# Patient Record
Sex: Female | Born: 1950
Health system: Southern US, Community
[De-identification: ages and names within clinical notes are randomized; demographics above are authoritative.]

## PROBLEM LIST (undated history)

## (undated) DIAGNOSIS — E785 Hyperlipidemia, unspecified: Secondary | ICD-10-CM

## (undated) DIAGNOSIS — M810 Age-related osteoporosis without current pathological fracture: Secondary | ICD-10-CM

## (undated) DIAGNOSIS — R55 Syncope and collapse: Secondary | ICD-10-CM

## (undated) DIAGNOSIS — C801 Malignant (primary) neoplasm, unspecified: Secondary | ICD-10-CM

## (undated) DIAGNOSIS — Z720 Tobacco use: Secondary | ICD-10-CM

## (undated) HISTORY — PX: TUBAL LIGATION: SHX77

## (undated) HISTORY — DX: Tobacco use: Z72.0

## (undated) HISTORY — DX: Hyperlipidemia, unspecified: E78.5

## (undated) HISTORY — PX: OTHER SURGICAL HISTORY: SHX169

## (undated) HISTORY — PX: COLON SURGERY: SHX602

## (undated) HISTORY — DX: Age-related osteoporosis without current pathological fracture: M81.0

## (undated) HISTORY — DX: Syncope and collapse: R55

---

## 1999-11-01 ENCOUNTER — Ambulatory Visit (HOSPITAL_COMMUNITY): Admission: RE | Admit: 1999-11-01 | Discharge: 1999-11-01 | Payer: Self-pay | Admitting: Family Medicine

## 1999-11-01 ENCOUNTER — Encounter: Payer: Self-pay | Admitting: Family Medicine

## 2011-12-24 ENCOUNTER — Encounter: Payer: Self-pay | Admitting: *Deleted

## 2013-09-20 ENCOUNTER — Ambulatory Visit: Payer: Self-pay | Attending: Internal Medicine

## 2013-09-28 ENCOUNTER — Encounter: Payer: Self-pay | Admitting: Internal Medicine

## 2013-09-28 ENCOUNTER — Ambulatory Visit: Payer: Self-pay | Attending: Internal Medicine | Admitting: Internal Medicine

## 2013-09-28 VITALS — BP 125/75 | HR 83 | Temp 98.4°F | Resp 16 | Ht 64.0 in | Wt 138.0 lb

## 2013-09-28 DIAGNOSIS — Z720 Tobacco use: Secondary | ICD-10-CM | POA: Insufficient documentation

## 2013-09-28 DIAGNOSIS — O020 Blighted ovum and nonhydatidiform mole: Secondary | ICD-10-CM | POA: Insufficient documentation

## 2013-09-28 DIAGNOSIS — F17200 Nicotine dependence, unspecified, uncomplicated: Secondary | ICD-10-CM

## 2013-09-28 DIAGNOSIS — D234 Other benign neoplasm of skin of scalp and neck: Secondary | ICD-10-CM | POA: Insufficient documentation

## 2013-09-28 DIAGNOSIS — Z139 Encounter for screening, unspecified: Secondary | ICD-10-CM

## 2013-09-28 DIAGNOSIS — K649 Unspecified hemorrhoids: Secondary | ICD-10-CM

## 2013-09-28 DIAGNOSIS — Z808 Family history of malignant neoplasm of other organs or systems: Secondary | ICD-10-CM | POA: Insufficient documentation

## 2013-09-28 DIAGNOSIS — Z1211 Encounter for screening for malignant neoplasm of colon: Secondary | ICD-10-CM

## 2013-09-28 DIAGNOSIS — O0289 Other abnormal products of conception: Secondary | ICD-10-CM

## 2013-09-28 HISTORY — DX: Unspecified hemorrhoids: K64.9

## 2013-09-28 LAB — CBC WITH DIFFERENTIAL/PLATELET
Basophils Absolute: 0 10*3/uL (ref 0.0–0.1)
Basophils Relative: 1 % (ref 0–1)
Eosinophils Absolute: 0.2 10*3/uL (ref 0.0–0.7)
Eosinophils Relative: 4 % (ref 0–5)
HCT: 38.6 % (ref 36.0–46.0)
Hemoglobin: 13 g/dL (ref 12.0–15.0)
Lymphocytes Relative: 42 % (ref 12–46)
Lymphs Abs: 1.8 10*3/uL (ref 0.7–4.0)
MCH: 30.9 pg (ref 26.0–34.0)
MCHC: 33.7 g/dL (ref 30.0–36.0)
MCV: 91.7 fL (ref 78.0–100.0)
Monocytes Absolute: 0.3 10*3/uL (ref 0.1–1.0)
Monocytes Relative: 7 % (ref 3–12)
Neutro Abs: 2 10*3/uL (ref 1.7–7.7)
Neutrophils Relative %: 46 % (ref 43–77)
Platelets: 273 10*3/uL (ref 150–400)
RBC: 4.21 MIL/uL (ref 3.87–5.11)
RDW: 12.7 % (ref 11.5–15.5)
WBC: 4.3 10*3/uL (ref 4.0–10.5)

## 2013-09-28 MED ORDER — HYDROCORTISONE ACETATE 25 MG RE SUPP: 25.0000 mg | Freq: Two times a day (BID) | RECTAL | Status: DC

## 2013-09-28 MED ORDER — NICOTINE 21 MG/24HR TD PT24: 21.0000 mg | MEDICATED_PATCH | Freq: Every day | TRANSDERMAL | Status: DC

## 2013-09-28 NOTE — Progress Notes (Signed)
Patient Demographics  Sara Coffey, is a 63 y.o. female  OMB:[id number]  GTX:[id number]  DOB - 11/24/1950  CC:  Chief Complaint  Patient presents with  . Establish Care       HPI: Sara Coffey is a 62 y.o. female here today to establish medical care. Patient has history of hemorrhoids and is requesting a medication, several years ago patient had colonoscopy done and was told she had a polyp removed, patient also reported to have noticed a mole on her scalp, patient has family history of skin cancer she is worried about, she does smoke cigarettes, advised to quit smoking she is going to try nicotine patch. Patient has No headache, No chest pain, No abdominal pain - No Nausea, No new weakness tingling or numbness, No Cough - SOB.  No Active Allergies Past Medical History  Diagnosis Date  . Syncope and collapse   . Hyperlipidemia   . Tobacco abuse    Current Outpatient Prescriptions on File Prior to Visit  Medication Sig Dispense Refill  . calcium carbonate (OS-CAL) 1250 MG chewable tablet Chew 1 tablet by mouth as directed.      . lovastatin (MEVACOR) 40 MG tablet Take 40 mg by mouth at bedtime.      . multivitamin (THERAGRAN) per tablet Take 1 tablet by mouth daily.       No current facility-administered medications on file prior to visit.   Family History  Problem Relation Age of Onset  . Heart disease Father   . Hypertension Father   . Diabetes Father   . Hyperlipidemia Brother    History   Social History  . Marital Status: Single    Spouse Name: N/A    Number of Children: N/A  . Years of Education: N/A   Occupational History  . Not on file.   Social History Main Topics  . Smoking status: Current Every Day Smoker -- 0.50 packs/day for 40 years    Types: Cigarettes  . Smokeless tobacco: Never Used  . Alcohol Use: No  . Drug Use: No  . Sexual Activity: Not on file   Other Topics Concern  . Not on file   Social History Narrative  . No narrative  on file    Review of Systems: Constitutional: Negative for fever, chills, diaphoresis, activity change, appetite change and fatigue. HENT: Negative for ear pain, nosebleeds, congestion, facial swelling, rhinorrhea, neck pain, neck stiffness and ear discharge.  Eyes: Negative for pain, discharge, redness, itching and visual disturbance. Respiratory: Negative for cough, choking, chest tightness, shortness of breath, wheezing and stridor.  Cardiovascular: Negative for chest pain, palpitations and leg swelling. Gastrointestinal: Negative for abdominal distention. Genitourinary: Negative for dysuria, urgency, frequency, hematuria, flank pain, decreased urine volume, difficulty urinating and dyspareunia.  Musculoskeletal: Negative for back pain, joint swelling, arthralgia and gait problem. Neurological: Negative for dizziness, tremors, seizures, syncope, facial asymmetry, speech difficulty, weakness, light-headedness, numbness and headaches.  Hematological: Negative for adenopathy. Does not bruise/bleed easily. Psychiatric/Behavioral: Negative for hallucinations, behavioral problems, confusion, dysphoric mood, decreased concentration and agitation.    Objective:   Filed Vitals:   09/28/13 1442  BP: 125/75  Pulse: 83  Temp: 98.4 F (36.9 C)  Resp: 16    Physical Exam: Constitutional: Patient appears well-developed and well-nourished. No distress. HENT: Normocephalic, atraumatic, External right and left ear normal. Oropharynx is clear and moist.  Eyes: Conjunctivae and EOM are normal. PERRLA, no scleral icterus. Neck: Normal ROM. Neck supple. No JVD. No tracheal deviation.  No thyromegaly. CVS: RRR, S1/S2 +, no murmurs, no gallops, no carotid bruit.  Pulmonary: Effort and breath sounds normal, no stridor, rhonchi, wheezes, rales.  Abdominal: Soft. BS +, no distension, tenderness, rebound or guarding.  Musculoskeletal: Normal range of motion. No edema and no tenderness.  Neuro: Alert.  Normal reflexes, muscle tone coordination. No cranial nerve deficit. Skin: Mole  on the scalp noticed Psychiatric: Normal mood and affect. Behavior, judgment, thought content normal.  No results found for this basename: WBC, HGB, HCT, MCV, PLT   No results found for this basename: CREATININE, BUN, NA, K, CL, CO2    No results found for this basename: HGBA1C   Lipid Panel  No results found for this basename: chol, trig, hdl, cholhdl, vldl, ldlcalc       Assessment and plan:   1. Hemorrhoid  - hydrocortisone (ANUSOL-HC) 25 MG suppository; Place 1 suppository (25 mg total) rectally 2 (two) times daily.  Dispense: 12 suppository; Refill: 0  2. Smoking  - nicotine (NICODERM CQ) 21 mg/24hr patch; Place 1 patch (21 mg total) onto the skin daily.  Dispense: 28 patch; Refill: 0  3. Carneous mole  - Ambulatory referral to Dermatology  4. Special screening for malignant neoplasms, colon  - Ambulatory referral to Gastroenterology  5. Screening Baseline blood work.  - CBC with Differential - COMPLETE METABOLIC PANEL WITH GFR - TSH - Lipid panel - Vit D  25 hydroxy (rtn osteoporosis monitoring) - MM DIGITAL SCREENING BILATERAL; Future     Health Maintenance -Colonoscopy:  Referral done  -Mammogram: ordered    Return in about 6 weeks (around 11/09/2013).    Lorayne Marek, MD

## 2013-09-28 NOTE — Progress Notes (Signed)
Pt is here to establish care. Pt has a mole on her head that is bothersome and bleeds sometimes. Pt reports that she has hemorrhoids.

## 2013-09-29 ENCOUNTER — Telehealth: Payer: Self-pay

## 2013-09-29 LAB — COMPLETE METABOLIC PANEL WITH GFR
ALT: 15 U/L (ref 0–35)
AST: 16 U/L (ref 0–37)
Albumin: 4.5 g/dL (ref 3.5–5.2)
Alkaline Phosphatase: 85 U/L (ref 39–117)
BUN: 10 mg/dL (ref 6–23)
CO2: 29 mEq/L (ref 19–32)
Calcium: 9.3 mg/dL (ref 8.4–10.5)
Chloride: 103 mEq/L (ref 96–112)
Creat: 0.68 mg/dL (ref 0.50–1.10)
GFR, Est African American: >89 mL/min
GFR, Est Non African American: >89 mL/min
Glucose, Bld: 102 mg/dL — ABNORMAL HIGH (ref 70–99)
Potassium: 4.5 mEq/L (ref 3.5–5.3)
Sodium: 138 mEq/L (ref 135–145)
Total Bilirubin: 0.4 mg/dL (ref 0.2–1.2)
Total Protein: 6.6 g/dL (ref 6.0–8.3)

## 2013-09-29 LAB — LIPID PANEL
Cholesterol: 236 mg/dL — ABNORMAL HIGH (ref 0–200)
HDL: 49 mg/dL (ref >39)
LDL Cholesterol: 133 mg/dL — ABNORMAL HIGH (ref 0–99)
Total CHOL/HDL Ratio: 4.8 Ratio
Triglycerides: 270 mg/dL — ABNORMAL HIGH (ref <150)
VLDL: 54 mg/dL — ABNORMAL HIGH (ref 0–40)

## 2013-09-29 LAB — TSH: TSH: 1.656 u[IU]/mL (ref 0.350–4.500)

## 2013-09-29 LAB — VITAMIN D 25 HYDROXY (VIT D DEFICIENCY, FRACTURES): Vit D, 25-Hydroxy: 31 ng/mL (ref 30–89)

## 2013-09-29 NOTE — Telephone Encounter (Signed)
Message copied by Dorothe Pea on Thu Sep 29, 2013  3:47 PM ------      Message from: Lorayne Marek      Created: Thu Sep 29, 2013  9:12 AM       Blood work reviewed, noticed elevated cholesterol, advise patient for low fat diet.      Also noticed impaired fasting glucose, call and advise patient for low carbohydrate diet.       ------

## 2013-09-29 NOTE — Telephone Encounter (Signed)
Patient returned phone call and i gave her The lab results

## 2013-09-29 NOTE — Telephone Encounter (Signed)
Spoke with husband Lynann Bologna.  He will inform his wife of her lab results

## 2013-10-31 ENCOUNTER — Telehealth: Payer: Self-pay | Admitting: Internal Medicine

## 2013-10-31 NOTE — Telephone Encounter (Signed)
Pt. Came in regarding Dermatology ref., Pt. Has [facility], their is a one year waiting list for Dermatology. PT. was given [facility] D[facility] information for self pay as alternative.

## 2013-11-09 ENCOUNTER — Ambulatory Visit: Payer: No Typology Code available for payment source | Attending: Internal Medicine | Admitting: Internal Medicine

## 2013-11-09 ENCOUNTER — Encounter: Payer: Self-pay | Admitting: Internal Medicine

## 2013-11-09 VITALS — BP 129/86 | HR 70 | Temp 98.3°F | Resp 15 | Wt 141.0 lb

## 2013-11-09 DIAGNOSIS — R7301 Impaired fasting glucose: Secondary | ICD-10-CM | POA: Insufficient documentation

## 2013-11-09 DIAGNOSIS — Z79899 Other long term (current) drug therapy: Secondary | ICD-10-CM | POA: Insufficient documentation

## 2013-11-09 DIAGNOSIS — E785 Hyperlipidemia, unspecified: Secondary | ICD-10-CM | POA: Insufficient documentation

## 2013-11-09 DIAGNOSIS — K649 Unspecified hemorrhoids: Secondary | ICD-10-CM

## 2013-11-09 DIAGNOSIS — F17200 Nicotine dependence, unspecified, uncomplicated: Secondary | ICD-10-CM | POA: Insufficient documentation

## 2013-11-09 NOTE — Progress Notes (Signed)
Patient here for follow up Had mole to top of head recently removed

## 2013-11-09 NOTE — Patient Instructions (Signed)
Diabetes Meal Planning Guide The diabetes meal planning guide is a tool to help you plan your meals and snacks. It is important for people with diabetes to manage their blood glucose (sugar) levels. Choosing the right foods and the right amounts throughout your day will help control your blood glucose. Eating right can even help you improve your blood pressure and reach or maintain a healthy weight. CARBOHYDRATE COUNTING MADE EASY When you eat carbohydrates, they turn to sugar. This raises your blood glucose level. Counting carbohydrates can help you control this level so you feel better. When you plan your meals by counting carbohydrates, you can have more flexibility in what you eat and balance your medicine with your food intake. Carbohydrate counting simply means adding up the total amount of carbohydrate grams in your meals and snacks. Try to eat about the same amount at each meal. Foods with carbohydrates are listed below. Each portion below is 1 carbohydrate serving or 15 grams of carbohydrates. Ask your dietician how many grams of carbohydrates you should eat at each meal or snack. Grains and Starches  1 slice bread.   English muffin or hotdog/hamburger bun.   cup cold cereal (unsweetened).   cup cooked pasta or rice.   cup starchy vegetables (corn, potatoes, peas, beans, winter squash).  1 tortilla (6 inches).   bagel.  1 waffle or pancake (size of a CD).   cup cooked cereal.  4 to 6 small crackers. *Whole grain is recommended. Fruit  1 cup fresh unsweetened berries, melon, papaya, pineapple.  1 small fresh fruit.   banana or mango.   cup fruit juice (4 oz unsweetened).   cup canned fruit in natural juice or water.  2 tbs dried fruit.  12 to 15 grapes or cherries. Milk and Yogurt  1 cup fat-free or 1% milk.  1 cup soy milk.  6 oz light yogurt with sugar-free sweetener.  6 oz low-fat soy yogurt.  6 oz plain yogurt. Vegetables  1 cup raw or  cup  cooked is counted as 0 carbohydrates or a "free" food.  If you eat 3 or more servings at 1 meal, count them as 1 carbohydrate serving. Other Carbohydrates   oz chips or pretzels.   cup ice cream or frozen yogurt.   cup sherbet or sorbet.  2 inch square cake, no frosting.  1 tbs honey, sugar, jam, jelly, or syrup.  2 small cookies.  3 squares of graham crackers.  3 cups popcorn.  6 crackers.  1 cup broth-based soup.  Count 1 cup casserole or other mixed foods as 2 carbohydrate servings.  Foods with less than 20 calories in a serving may be counted as 0 carbohydrates or a "free" food. You may want to purchase a book or computer software that lists the carbohydrate gram counts of different foods. In addition, the nutrition facts panel on the labels of the foods you eat are a good source of this information. The label will tell you how big the serving size is and the total number of carbohydrate grams you will be eating per serving. Divide this number by 15 to obtain the number of carbohydrate servings in a portion. Remember, 1 carbohydrate serving equals 15 grams of carbohydrate. SERVING SIZES Measuring foods and serving sizes helps you make sure you are getting the right amount of food. The list below tells how big or small some common serving sizes are.  1 oz.........4 stacked dice.  3 oz.........Deck of cards.  1 tsp........Tip   of little finger.  1 tbs........Thumb.  2 tbs........Golf ball.   cup.......Half of a fist.  1 cup........A fist. SAMPLE DIABETES MEAL PLAN Below is a sample meal plan that includes foods from the grain and starches, dairy, vegetable, fruit, and meat groups. A dietician can individualize a meal plan to fit your calorie needs and tell you the number of servings needed from each food group. However, controlling the total amount of carbohydrates in your meal or snack is more important than making sure you include all of the food groups at every  meal. You may interchange carbohydrate containing foods (dairy, starches, and fruits). The meal plan below is an example of a 2000 calorie diet using carbohydrate counting. This meal plan has 17 carbohydrate servings. Breakfast  1 cup oatmeal (2 carb servings).   cup light yogurt (1 carb serving).  1 cup blueberries (1 carb serving).   cup almonds. Snack  1 large apple (2 carb servings).  1 low-fat string cheese stick. Lunch  Chicken breast salad.  1 cup spinach.   cup chopped tomatoes.  2 oz chicken breast, sliced.  2 tbs low-fat Italian dressing.  12 whole-wheat crackers (2 carb servings).  12 to 15 grapes (1 carb serving).  1 cup low-fat milk (1 carb serving). Snack  1 cup carrots.   cup hummus (1 carb serving). Dinner  3 oz broiled salmon.  1 cup brown rice (3 carb servings). Snack  1  cups steamed broccoli (1 carb serving) drizzled with 1 tsp olive oil and lemon juice.  1 cup light pudding (2 carb servings). DIABETES MEAL PLANNING WORKSHEET Your dietician can use this worksheet to help you decide how many servings of foods and what types of foods are right for you.  BREAKFAST Food Group and Servings / Carb Servings Grain/Starches __________________________________ Dairy __________________________________________ Vegetable ______________________________________ Fruit ___________________________________________ Meat __________________________________________ Fat ____________________________________________ LUNCH Food Group and Servings / Carb Servings Grain/Starches ___________________________________ Dairy ___________________________________________ Fruit ____________________________________________ Meat ___________________________________________ Fat _____________________________________________ DINNER Food Group and Servings / Carb Servings Grain/Starches ___________________________________ Dairy  ___________________________________________ Fruit ____________________________________________ Meat ___________________________________________ Fat _____________________________________________ SNACKS Food Group and Servings / Carb Servings Grain/Starches ___________________________________ Dairy ___________________________________________ Vegetable _______________________________________ Fruit ____________________________________________ Meat ___________________________________________ Fat _____________________________________________ DAILY TOTALS Starches _________________________ Vegetable ________________________ Fruit ____________________________ Dairy ____________________________ Meat ____________________________ Fat ______________________________ Document Released: 05/01/2005 Document Revised: 10/27/2011 Document Reviewed: 03/12/2009 [employer] Patient Information 2014 [employer]. Fat and Cholesterol Control Diet Fat and cholesterol levels in your blood and organs are influenced by your diet. High levels of fat and cholesterol may lead to diseases of the heart, small and large blood vessels, gallbladder, liver, and pancreas. CONTROLLING FAT AND CHOLESTEROL WITH DIET Although exercise and lifestyle factors are important, your diet is key. That is because certain foods are known to raise cholesterol and others to lower it. The goal is to balance foods for their effect on cholesterol and more importantly, to replace saturated and trans fat with other types of fat, such as monounsaturated fat, polyunsaturated fat, and omega-3 fatty acids. On average, a person should consume no more than 15 to 17 g of saturated fat daily. Saturated and trans fats are considered "bad" fats, and they will raise LDL cholesterol. Saturated fats are primarily found in animal products such as meats, butter, and cream. However, that does not mean you need to give up all your favorite foods. Today, there are  good tasting, low-fat, low-cholesterol substitutes for most of the things you like to eat. Choose low-fat or nonfat alternatives. Choose round or   loin cuts of red meat. These types of cuts are lowest in fat and cholesterol. Chicken (without the skin), fish, veal, and ground turkey breast are great choices. Eliminate fatty meats, such as hot dogs and salami. Even shellfish have little or no saturated fat. Have a 3 oz (85 g) portion when you eat lean meat, poultry, or fish. Trans fats are also called "partially hydrogenated oils." They are oils that have been scientifically manipulated so that they are solid at room temperature resulting in a longer shelf life and improved taste and texture of foods in which they are added. Trans fats are found in stick margarine, some tub margarines, cookies, crackers, and baked goods.  When baking and cooking, oils are a great substitute for butter. The monounsaturated oils are especially beneficial since it is believed they lower LDL and raise HDL. The oils you should avoid entirely are saturated tropical oils, such as coconut and palm.  Remember to eat a lot from food groups that are naturally free of saturated and trans fat, including fish, fruit, vegetables, beans, grains (barley, rice, couscous, bulgur wheat), and pasta (without cream sauces).  IDENTIFYING FOODS THAT LOWER FAT AND CHOLESTEROL  Soluble fiber may lower your cholesterol. This type of fiber is found in fruits such as apples, vegetables such as broccoli, potatoes, and carrots, legumes such as beans, peas, and lentils, and grains such as barley. Foods fortified with plant sterols (phytosterol) may also lower cholesterol. You should eat at least 2 g per day of these foods for a cholesterol lowering effect.  Read package labels to identify low-saturated fats, trans fat free, and low-fat foods at the supermarket. Select cheeses that have only 2 to 3 g saturated fat per ounce. Use a heart-healthy tub margarine that  is free of trans fats or partially hydrogenated oil. When buying baked goods (cookies, crackers), avoid partially hydrogenated oils. Breads and muffins should be made from whole grains (whole-wheat or whole oat flour, instead of "flour" or "enriched flour"). Buy non-creamy canned soups with reduced salt and no added fats.  FOOD PREPARATION TECHNIQUES  Never deep-fry. If you must fry, either stir-fry, which uses very little fat, or use non-stick cooking sprays. When possible, broil, bake, or roast meats, and steam vegetables. Instead of putting butter or margarine on vegetables, use lemon and herbs, applesauce, and cinnamon (for squash and sweet potatoes). Use nonfat yogurt, salsa, and low-fat dressings for salads.  LOW-SATURATED FAT / LOW-FAT FOOD SUBSTITUTES Meats / Saturated Fat (g)  Avoid: Steak, marbled (3 oz/85 g) / 11 g  Choose: Steak, lean (3 oz/85 g) / 4 g  Avoid: Hamburger (3 oz/85 g) / 7 g  Choose: Hamburger, lean (3 oz/85 g) / 5 g  Avoid: Ham (3 oz/85 g) / 6 g  Choose: Ham, lean cut (3 oz/85 g) / 2.4 g  Avoid: Chicken, with skin, dark meat (3 oz/85 g) / 4 g  Choose: Chicken, skin removed, dark meat (3 oz/85 g) / 2 g  Avoid: Chicken, with skin, light meat (3 oz/85 g) / 2.5 g  Choose: Chicken, skin removed, light meat (3 oz/85 g) / 1 g Dairy / Saturated Fat (g)  Avoid: Whole milk (1 cup) / 5 g  Choose: Low-fat milk, 2% (1 cup) / 3 g  Choose: Low-fat milk, 1% (1 cup) / 1.5 g  Choose: Skim milk (1 cup) / 0.3 g  Avoid: Hard cheese (1 oz/28 g) / 6 g  Choose: Skim milk cheese (1 oz/28 g) /   2 to 3 g  Avoid: Cottage cheese, 4% fat (1 cup) / 6.5 g  Choose: Low-fat cottage cheese, 1% fat (1 cup) / 1.5 g  Avoid: Ice cream (1 cup) / 9 g  Choose: Sherbet (1 cup) / 2.5 g  Choose: Nonfat frozen yogurt (1 cup) / 0.3 g  Choose: Frozen fruit bar / trace  Avoid: Whipped cream (1 tbs) / 3.5 g  Choose: Nondairy whipped topping (1 tbs) / 1 g Condiments / Saturated Fat  (g)  Avoid: Mayonnaise (1 tbs) / 2 g  Choose: Low-fat mayonnaise (1 tbs) / 1 g  Avoid: Butter (1 tbs) / 7 g  Choose: Extra light margarine (1 tbs) / 1 g  Avoid: Coconut oil (1 tbs) / 11.8 g  Choose: Olive oil (1 tbs) / 1.8 g  Choose: Corn oil (1 tbs) / 1.7 g  Choose: Safflower oil (1 tbs) / 1.2 g  Choose: Sunflower oil (1 tbs) / 1.4 g  Choose: Soybean oil (1 tbs) / 2.4 g  Choose: Canola oil (1 tbs) / 1 g Document Released: 08/04/2005 Document Revised: 11/29/2012 Document Reviewed: 01/23/2011 [employer] Patient Information 2014 [employer].  

## 2013-11-09 NOTE — Progress Notes (Signed)
MRN: [id number] Name: Sara Coffey  Sex: female Age: 63 y.o. DOB: 02/17/1951  Allergies: Review of patient's allergies indicates no known allergies.  Chief Complaint  Patient presents with  . Follow-up    HPI: Patient is 63 y.o. female who comes today for followup, she recently had a blood work done which was reviewed with the patient noticed hyperlipidemia also impaired fasting glucose, she denies any acute symptoms, she recently followed with a dermatologist and had mole removed from scalp, she has already been referred to GI for screening colonoscopy.  Past Medical History  Diagnosis Date  . Syncope and collapse   . Hyperlipidemia   . Tobacco abuse     Past Surgical History  Procedure Laterality Date  . Tubal ligation    . Knee sx    . Left eye surgery         Medication List       This list is accurate as of: 11/09/13  3:34 PM.  Always use your most recent med list.               calcium carbonate 1250 MG chewable tablet  Commonly known as:  OS-CAL  Chew 1 tablet by mouth as directed.     hydrocortisone 25 MG suppository  Commonly known as:  ANUSOL-HC  Place 1 suppository (25 mg total) rectally 2 (two) times daily.     lovastatin 40 MG tablet  Commonly known as:  MEVACOR  Take 40 mg by mouth at bedtime.     multivitamin per tablet  Take 1 tablet by mouth daily.     nicotine 21 mg/24hr patch  Commonly known as:  NICODERM CQ  Place 1 patch (21 mg total) onto the skin daily.        No orders of the defined types were placed in this encounter.     There is no immunization history on file for this patient.  Family History  Problem Relation Age of Onset  . Heart disease Father   . Hypertension Father   . Diabetes Father   . Hyperlipidemia Brother     History  Substance Use Topics  . Smoking status: Current Every Day Smoker -- 0.50 packs/day for 40 years    Types: Cigarettes  . Smokeless tobacco: Never Used  . Alcohol Use: No     Review of Systems   As noted in HPI  Filed Vitals:   11/09/13 1519  BP: 129/86  Pulse: 70  Temp: 98.3 F (36.8 C)  Resp: 15    Physical Exam  Physical Exam  Constitutional: No distress.  Eyes: EOM are normal. Pupils are equal, round, and reactive to light.  Cardiovascular: Normal rate and regular rhythm.   Pulmonary/Chest: Breath sounds normal. No respiratory distress. She has no wheezes. She has no rales.  Musculoskeletal: She exhibits no edema.    CBC    Component Value Date/Time   WBC 4.3 09/28/2013 1516   RBC 4.21 09/28/2013 1516   HGB 13.0 09/28/2013 1516   HCT 38.6 09/28/2013 1516   PLT 273 09/28/2013 1516   MCV 91.7 09/28/2013 1516   LYMPHSABS 1.8 09/28/2013 1516   MONOABS 0.3 09/28/2013 1516   EOSABS 0.2 09/28/2013 1516   BASOSABS 0.0 09/28/2013 1516    CMP     Component Value Date/Time   NA 138 09/28/2013 1516   K 4.5 09/28/2013 1516   CL 103 09/28/2013 1516   CO2 29 09/28/2013 1516   GLUCOSE 102*  09/28/2013 1516   BUN 10 09/28/2013 1516   CREATININE 0.68 09/28/2013 1516   CALCIUM 9.3 09/28/2013 1516   PROT 6.6 09/28/2013 1516   ALBUMIN 4.5 09/28/2013 1516   AST 16 09/28/2013 1516   ALT 15 09/28/2013 1516   ALKPHOS 85 09/28/2013 1516   BILITOT 0.4 09/28/2013 1516    Lab Results  Component Value Date/Time   CHOL 236* 09/28/2013  3:16 PM    No components found with this basename: hga1c    Lab Results  Component Value Date/Time   AST 16 09/28/2013  3:16 PM    Assessment and Plan  Other and unspecified hyperlipidemia Advised patient for low fat diet  IFG (impaired fasting glucose) Advise patient for low carbohydrate diet  Smoking Patient is trying  to quit smoking already was given nicotine patch prescription.  Hemorrhoid Has anusol HC suppository   Return in about 6 months (around 05/12/2014) for hyperipidemia.  Lorayne Marek, MD

## 2016-12-08 ENCOUNTER — Other Ambulatory Visit: Payer: Self-pay | Admitting: Obstetrics and Gynecology

## 2016-12-08 DIAGNOSIS — Z1231 Encounter for screening mammogram for malignant neoplasm of breast: Secondary | ICD-10-CM

## 2016-12-18 ENCOUNTER — Ambulatory Visit
Admission: RE | Admit: 2016-12-18 | Discharge: 2016-12-18 | Disposition: A | Discharge status: Home / Self Care | Payer: No Typology Code available for payment source | Source: Ambulatory Visit | Attending: Obstetrics and Gynecology | Admitting: Obstetrics and Gynecology

## 2016-12-18 ENCOUNTER — Encounter (HOSPITAL_COMMUNITY): Payer: Self-pay

## 2016-12-18 ENCOUNTER — Ambulatory Visit (HOSPITAL_COMMUNITY)
Admission: RE | Admit: 2016-12-18 | Discharge: 2016-12-18 | Disposition: A | Discharge status: Home / Self Care | Payer: Self-pay | Source: Ambulatory Visit | Attending: Obstetrics and Gynecology | Admitting: Obstetrics and Gynecology

## 2016-12-18 VITALS — BP 130/78 | Temp 98.5°F | Ht 65.0 in | Wt 131.8 lb

## 2016-12-18 DIAGNOSIS — Z01419 Encounter for gynecological examination (general) (routine) without abnormal findings: Secondary | ICD-10-CM

## 2016-12-18 DIAGNOSIS — Z1231 Encounter for screening mammogram for malignant neoplasm of breast: Secondary | ICD-10-CM

## 2016-12-18 NOTE — Patient Instructions (Signed)
Explained breast self awareness with Benjaman Pott. Let patient know [facility] will cover Pap smears and HPV typing every 5 years unless has a history of abnormal Pap smears. Referred patient to the [place] for a screening mammogram. Appointment scheduled for Thursday, Dec 18, 2016 at 1540. Let patient know will follow up with her within the next couple weeks with results of Pap smear by phone. Informed patient that [facility] will [facility] up with her within the next couple of weeks with results of mammogram by letter or phone. Benjaman Pott verbalized understanding.  [place], Arvil Chaco, RN 3:31 PM

## 2016-12-18 NOTE — Progress Notes (Signed)
No complaints today.   Pap Smear: Pap smear completed today. Last Pap smear was 10 years ago and normal per patient. Per patient has no history of an abnormal Pap smear. No Pap smear results are in EPIC.  Physical exam: Breasts Breasts symmetrical. No skin abnormalities bilateral breasts. No nipple retraction bilateral breasts. No nipple discharge bilateral breasts. No lymphadenopathy. No lumps palpated bilateral breasts. No complaints of pain or tenderness on exam. Referred patient to the [place] for a screening mammogram. Appointment scheduled for Thursday, Dec 18, 2016 at 1540.  Pelvic/Bimanual   Ext Genitalia No lesions, no swelling and no discharge observed on external genitalia.         Vagina Vagina pink and normal texture. No lesions or discharge observed in vagina.          Cervix Cervix is present. Cervix pink and of normal texture. Cervix friable. No discharge observed.     Uterus Uterus is present and palpable. Uterus in normal position and normal size.        Adnexae Bilateral ovaries present and palpable. No tenderness on palpation.          Rectovaginal No rectal exam completed today since patient had no rectal complaints. No skin abnormalities observed on exam.    Smoking History: Patient is a current smoker. Discussed smoking cessation with patient. Referred patient to the [facility] Quitline and gave resources to the free smoking cessation classes offered at [facility].  Patient Navigation: Patient education provided. Access to services provided for patient through [place] program.   Colorectal Cancer Screening: Per patient had a colonoscopy completed 5-6 years ago. No complaints today. FIT Test given to patient to complete and return to [facility].

## 2016-12-19 ENCOUNTER — Other Ambulatory Visit: Payer: Self-pay | Admitting: Obstetrics and Gynecology

## 2016-12-19 DIAGNOSIS — R928 Other abnormal and inconclusive findings on diagnostic imaging of breast: Secondary | ICD-10-CM

## 2016-12-19 LAB — CYTOLOGY - PAP
Diagnosis: NEGATIVE
HPV: NOT DETECTED

## 2016-12-22 ENCOUNTER — Encounter (HOSPITAL_COMMUNITY): Payer: Self-pay | Admitting: *Deleted

## 2016-12-23 ENCOUNTER — Ambulatory Visit
Admission: RE | Admit: 2016-12-23 | Discharge: 2016-12-23 | Disposition: A | Discharge status: Home / Self Care | Payer: No Typology Code available for payment source | Source: Ambulatory Visit | Attending: Obstetrics and Gynecology | Admitting: Obstetrics and Gynecology

## 2016-12-23 DIAGNOSIS — R928 Other abnormal and inconclusive findings on diagnostic imaging of breast: Secondary | ICD-10-CM

## 2016-12-24 ENCOUNTER — Encounter (HOSPITAL_COMMUNITY): Payer: Self-pay | Admitting: *Deleted

## 2016-12-24 NOTE — Progress Notes (Signed)
Letter sent informing patient of negative pap smear results. HPV was negative. Next pap smear due in five years.

## 2017-04-13 ENCOUNTER — Telehealth (HOSPITAL_COMMUNITY): Payer: Self-pay

## 2017-04-13 NOTE — Telephone Encounter (Signed)
Left message with patient reminding her about completing the at home FIT test that was given to her in [place] on 12/18/16. I let her know if she had any questions she could call me back.

## 2018-06-26 DIAGNOSIS — R69 Illness, unspecified: Secondary | ICD-10-CM | POA: Diagnosis not present

## 2018-09-20 DIAGNOSIS — C44729 Squamous cell carcinoma of skin of left lower limb, including hip: Secondary | ICD-10-CM | POA: Diagnosis not present

## 2018-09-20 DIAGNOSIS — L308 Other specified dermatitis: Secondary | ICD-10-CM | POA: Diagnosis not present

## 2018-10-05 DIAGNOSIS — C44729 Squamous cell carcinoma of skin of left lower limb, including hip: Secondary | ICD-10-CM | POA: Diagnosis not present

## 2019-04-17 ENCOUNTER — Other Ambulatory Visit: Payer: Self-pay

## 2019-04-17 ENCOUNTER — Observation Stay (HOSPITAL_COMMUNITY)
Admission: EM | Admit: 2019-04-17 | Discharge: 2019-04-18 | Disposition: A | Discharge status: Home / Self Care | Payer: Medicare HMO | Attending: Internal Medicine | Admitting: Internal Medicine

## 2019-04-17 ENCOUNTER — Encounter (HOSPITAL_COMMUNITY): Payer: Self-pay

## 2019-04-17 ENCOUNTER — Emergency Department (HOSPITAL_COMMUNITY): Payer: Medicare HMO

## 2019-04-17 DIAGNOSIS — R69 Illness, unspecified: Secondary | ICD-10-CM | POA: Diagnosis not present

## 2019-04-17 DIAGNOSIS — M179 Osteoarthritis of knee, unspecified: Secondary | ICD-10-CM | POA: Diagnosis not present

## 2019-04-17 DIAGNOSIS — R29818 Other symptoms and signs involving the nervous system: Secondary | ICD-10-CM | POA: Diagnosis not present

## 2019-04-17 DIAGNOSIS — Z20828 Contact with and (suspected) exposure to other viral communicable diseases: Secondary | ICD-10-CM | POA: Diagnosis not present

## 2019-04-17 DIAGNOSIS — Z03818 Encounter for observation for suspected exposure to other biological agents ruled out: Secondary | ICD-10-CM | POA: Diagnosis not present

## 2019-04-17 DIAGNOSIS — Z79899 Other long term (current) drug therapy: Secondary | ICD-10-CM | POA: Insufficient documentation

## 2019-04-17 DIAGNOSIS — G319 Degenerative disease of nervous system, unspecified: Secondary | ICD-10-CM | POA: Diagnosis not present

## 2019-04-17 DIAGNOSIS — R4189 Other symptoms and signs involving cognitive functions and awareness: Secondary | ICD-10-CM | POA: Diagnosis present

## 2019-04-17 DIAGNOSIS — Z833 Family history of diabetes mellitus: Secondary | ICD-10-CM | POA: Insufficient documentation

## 2019-04-17 DIAGNOSIS — Z8249 Family history of ischemic heart disease and other diseases of the circulatory system: Secondary | ICD-10-CM | POA: Diagnosis not present

## 2019-04-17 DIAGNOSIS — F17210 Nicotine dependence, cigarettes, uncomplicated: Secondary | ICD-10-CM | POA: Diagnosis not present

## 2019-04-17 DIAGNOSIS — G454 Transient global amnesia: Secondary | ICD-10-CM | POA: Diagnosis not present

## 2019-04-17 DIAGNOSIS — R413 Other amnesia: Secondary | ICD-10-CM | POA: Insufficient documentation

## 2019-04-17 DIAGNOSIS — R404 Transient alteration of awareness: Secondary | ICD-10-CM | POA: Diagnosis not present

## 2019-04-17 DIAGNOSIS — R411 Anterograde amnesia: Secondary | ICD-10-CM | POA: Diagnosis not present

## 2019-04-17 DIAGNOSIS — G459 Transient cerebral ischemic attack, unspecified: Secondary | ICD-10-CM | POA: Insufficient documentation

## 2019-04-17 DIAGNOSIS — R011 Cardiac murmur, unspecified: Secondary | ICD-10-CM | POA: Insufficient documentation

## 2019-04-17 DIAGNOSIS — Z720 Tobacco use: Secondary | ICD-10-CM | POA: Diagnosis present

## 2019-04-17 DIAGNOSIS — E785 Hyperlipidemia, unspecified: Secondary | ICD-10-CM | POA: Diagnosis present

## 2019-04-17 DIAGNOSIS — R000 Tachycardia, unspecified: Secondary | ICD-10-CM | POA: Diagnosis not present

## 2019-04-17 HISTORY — DX: Other amnesia: R41.3

## 2019-04-17 HISTORY — DX: Transient cerebral ischemic attack, unspecified: G45.9

## 2019-04-17 LAB — CBC
HCT: 41.4 % (ref 36.0–46.0)
Hemoglobin: 14 g/dL (ref 12.0–15.0)
MCH: 31.7 pg (ref 26.0–34.0)
MCHC: 33.8 g/dL (ref 30.0–36.0)
MCV: 93.7 fL (ref 80.0–100.0)
Platelets: 266 10*3/uL (ref 150–400)
RBC: 4.42 MIL/uL (ref 3.87–5.11)
RDW: 12.3 % (ref 11.5–15.5)
WBC: 5.3 10*3/uL (ref 4.0–10.5)
nRBC: 0 % (ref 0.0–0.2)

## 2019-04-17 LAB — DIFFERENTIAL
Abs Immature Granulocytes: 0.01 10*3/uL (ref 0.00–0.07)
Basophils Absolute: 0.1 10*3/uL (ref 0.0–0.1)
Basophils Relative: 1 %
Eosinophils Absolute: 0.2 10*3/uL (ref 0.0–0.5)
Eosinophils Relative: 3 %
Immature Granulocytes: 0 %
Lymphocytes Relative: 28 %
Lymphs Abs: 1.5 10*3/uL (ref 0.7–4.0)
Monocytes Absolute: 0.3 10*3/uL (ref 0.1–1.0)
Monocytes Relative: 5 %
Neutro Abs: 3.3 10*3/uL (ref 1.7–7.7)
Neutrophils Relative %: 63 %

## 2019-04-17 LAB — RAPID URINE DRUG SCREEN, HOSP PERFORMED
Amphetamines: NOT DETECTED
Barbiturates: NOT DETECTED
Benzodiazepines: NOT DETECTED
Cocaine: NOT DETECTED
Opiates: NOT DETECTED
Tetrahydrocannabinol: NOT DETECTED

## 2019-04-17 LAB — SARS CORONAVIRUS 2 (TAT 6-24 HRS): SARS Coronavirus 2: NEGATIVE

## 2019-04-17 LAB — COMPREHENSIVE METABOLIC PANEL
ALT: 13 U/L (ref 0–44)
AST: 21 U/L (ref 15–41)
Albumin: 4.2 g/dL (ref 3.5–5.0)
Alkaline Phosphatase: 66 U/L (ref 38–126)
Anion gap: 13 (ref 5–15)
BUN: 8 mg/dL (ref 8–23)
CO2: 21 mmol/L — ABNORMAL LOW (ref 22–32)
Calcium: 9.4 mg/dL (ref 8.9–10.3)
Chloride: 104 mmol/L (ref 98–111)
Creatinine, Ser: 0.8 mg/dL (ref 0.44–1.00)
GFR calc Af Amer: >60 mL/min (ref >60)
GFR calc non Af Amer: >60 mL/min (ref >60)
Glucose, Bld: 101 mg/dL — ABNORMAL HIGH (ref 70–99)
Potassium: 4.5 mmol/L (ref 3.5–5.1)
Sodium: 138 mmol/L (ref 135–145)
Total Bilirubin: 1.1 mg/dL (ref 0.3–1.2)
Total Protein: 6.8 g/dL (ref 6.5–8.1)

## 2019-04-17 LAB — URINALYSIS, ROUTINE W REFLEX MICROSCOPIC
Bilirubin Urine: NEGATIVE
Glucose, UA: NEGATIVE mg/dL
Ketones, ur: 5 mg/dL — AB
Leukocytes,Ua: NEGATIVE
Nitrite: NEGATIVE
Protein, ur: NEGATIVE mg/dL
Specific Gravity, Urine: 1.009 (ref 1.005–1.030)
pH: 7 (ref 5.0–8.0)

## 2019-04-17 LAB — I-STAT CHEM 8, ED
BUN: 8 mg/dL (ref 8–23)
Calcium, Ion: 1.1 mmol/L — ABNORMAL LOW (ref 1.15–1.40)
Chloride: 107 mmol/L (ref 98–111)
Creatinine, Ser: 0.7 mg/dL (ref 0.44–1.00)
Glucose, Bld: 96 mg/dL (ref 70–99)
HCT: 41 % (ref 36.0–46.0)
Hemoglobin: 13.9 g/dL (ref 12.0–15.0)
Potassium: 4.5 mmol/L (ref 3.5–5.1)
Sodium: 138 mmol/L (ref 135–145)
TCO2: 23 mmol/L (ref 22–32)

## 2019-04-17 LAB — PROTIME-INR
INR: 1 (ref 0.8–1.2)
Prothrombin Time: 12.7 seconds (ref 11.4–15.2)

## 2019-04-17 LAB — CBG MONITORING, ED: Glucose-Capillary: 96 mg/dL (ref 70–99)

## 2019-04-17 LAB — APTT: aPTT: 30 seconds (ref 24–36)

## 2019-04-17 MED ORDER — ENOXAPARIN SODIUM 40 MG/0.4ML ~~LOC~~ SOLN
40.0000 mg | Freq: Every day | SUBCUTANEOUS | Status: DC
  Administered 2019-04-18: 40 mg via SUBCUTANEOUS
  Filled 2019-04-17: qty 0.4

## 2019-04-17 MED ORDER — NICOTINE 21 MG/24HR TD PT24
21.0000 mg | MEDICATED_PATCH | Freq: Every day | TRANSDERMAL | Status: DC
  Administered 2019-04-18: 21 mg via TRANSDERMAL
  Filled 2019-04-17: qty 1

## 2019-04-17 MED ORDER — ACETAMINOPHEN 325 MG PO TABS
650.0000 mg | ORAL_TABLET | ORAL | Status: DC | PRN
  Administered 2019-04-17 – 2019-04-18 (×2): 650 mg via ORAL
  Filled 2019-04-17 (×2): qty 2

## 2019-04-17 MED ORDER — SENNOSIDES-DOCUSATE SODIUM 8.6-50 MG PO TABS: 1.0000 | ORAL_TABLET | Freq: Every evening | ORAL | Status: DC | PRN

## 2019-04-17 MED ORDER — ACETAMINOPHEN 650 MG RE SUPP: 650.0000 mg | RECTAL | Status: DC | PRN

## 2019-04-17 MED ORDER — ASPIRIN 325 MG PO TABS
325.0000 mg | ORAL_TABLET | Freq: Every day | ORAL | Status: DC
  Administered 2019-04-17 – 2019-04-18 (×2): 325 mg via ORAL
  Filled 2019-04-17 (×2): qty 1

## 2019-04-17 MED ORDER — SODIUM CHLORIDE 0.9 % IV SOLN
INTRAVENOUS | Status: AC
  Administered 2019-04-17 (×2): via INTRAVENOUS

## 2019-04-17 MED ORDER — ENOXAPARIN SODIUM 40 MG/0.4ML ~~LOC~~ SOLN: 40.0000 mg | SUBCUTANEOUS | Status: DC

## 2019-04-17 MED ORDER — STROKE: EARLY STAGES OF RECOVERY BOOK
Freq: Once | Status: AC
  Administered 2019-04-17: 18:00:00
  Filled 2019-04-17: qty 1

## 2019-04-17 MED ORDER — ASPIRIN 300 MG RE SUPP: 300.0000 mg | Freq: Every day | RECTAL | Status: DC

## 2019-04-17 MED ORDER — SODIUM CHLORIDE 0.9% FLUSH
3.0000 mL | Freq: Once | INTRAVENOUS | Status: AC
  Administered 2019-04-17: 3 mL via INTRAVENOUS

## 2019-04-17 MED ORDER — ACETAMINOPHEN 160 MG/5ML PO SOLN: 650.0000 mg | ORAL | Status: DC | PRN

## 2019-04-17 NOTE — ED Provider Notes (Signed)
[facility] EMERGENCY DEPARTMENT Provider Note   CSN: [id number] Arrival date & time: 04/17/19  1405     History   Chief Complaint Chief Complaint  Patient presents with  . Code Stroke    HPI Sara Coffey is a 68 y.o. female.     The history is provided by the patient and medical records. No language interpreter was used.  Cerebrovascular Accident This is a new problem. The current episode started 1 to 2 hours ago. The problem occurs constantly. The problem has not changed since onset.Pertinent negatives include no chest pain, no abdominal pain, no headaches and no shortness of breath. Nothing aggravates the symptoms. Nothing relieves the symptoms. She has tried nothing for the symptoms. The treatment provided no relief.    Past Medical History:  Diagnosis Date  . Hyperlipidemia   . Syncope and collapse   . Tobacco abuse     Patient Active Problem List   Diagnosis Date Noted  . Other and unspecified hyperlipidemia 11/09/2013  . IFG (impaired fasting glucose) 11/09/2013  . Hemorrhoid 09/28/2013  . Smoking 09/28/2013  . Carneous mole 09/28/2013    Past Surgical History:  Procedure Laterality Date  . KNEE SX    . left eye surgery     . TUBAL LIGATION       OB History    Gravida  3   Para      Term      Preterm      AB      Living  3     SAB      TAB      Ectopic      Multiple      Live Births  3            Home Medications    Prior to Admission medications   Medication Sig Start Date End Date Taking? Authorizing Provider  calcium carbonate (OS-CAL) 1250 MG chewable tablet Chew 1 tablet by mouth as directed.    [provider]  hydrocortisone (ANUSOL-HC) 25 MG suppository Place 1 suppository (25 mg total) rectally 2 (two) times daily. Patient not taking: Reported on 12/18/2016 09/28/13   Lorayne Marek, MD  lovastatin (MEVACOR) 40 MG tablet Take 40 mg by mouth at bedtime.    [provider]   multivitamin [facility]) per tablet Take 1 tablet by mouth daily.    [provider]  nicotine (NICODERM CQ) 21 mg/24hr patch Place 1 patch (21 mg total) onto the skin daily. Patient not taking: Reported on 12/18/2016 09/28/13   Lorayne Marek, MD    Family History Family History  Problem Relation Age of Onset  . Heart disease Father   . Hypertension Father   . Diabetes Father   . Hyperlipidemia Brother   . Breast cancer Neg Hx     Social History Social History   Tobacco Use  . Smoking status: Current Every Day Smoker    Packs/day: 0.50    Years: 40.00    Pack years: 20.00    Types: Cigarettes  . Smokeless tobacco: Never Used  Substance Use Topics  . Alcohol use: No  . Drug use: No     Allergies   Patient has no known allergies.   Review of Systems Review of Systems  Constitutional: Negative for chills, diaphoresis, fatigue and fever.  HENT: Negative for congestion.   Eyes: Negative for visual disturbance.  Respiratory: Negative for cough, chest tightness, shortness of breath and wheezing.  Cardiovascular: Negative for chest pain, palpitations and leg swelling.  Gastrointestinal: Negative for abdominal pain, constipation, diarrhea, nausea and vomiting.  Genitourinary: Negative for dysuria, flank pain and frequency.  Musculoskeletal: Negative for back pain, neck pain and neck stiffness.  Neurological: Negative for dizziness, seizures, facial asymmetry, speech difficulty, weakness, light-headedness, numbness and headaches.  All other systems reviewed and are negative.    Physical Exam Updated Vital Signs BP (!) 156/70   Pulse 83   Resp 16   Ht 5\' 3"  (1.6 m)   Wt 60.8 kg   SpO2 100%   BMI 23.74 kg/m   Physical Exam Vitals signs and nursing note reviewed.  Constitutional:      General: She is not in acute distress.    Appearance: She is well-developed. She is not ill-appearing, toxic-appearing or diaphoretic.  HENT:     Head: Normocephalic and  atraumatic.     Right Ear: External ear normal.     Left Ear: External ear normal.     Nose: Nose normal. No congestion or rhinorrhea.     Mouth/Throat:     Mouth: Mucous membranes are moist.     Pharynx: No oropharyngeal exudate.  Eyes:     Conjunctiva/sclera: Conjunctivae normal.     Pupils: Pupils are equal, round, and reactive to light.  Neck:     Musculoskeletal: Normal range of motion and neck supple. No muscular tenderness.  Cardiovascular:     Rate and Rhythm: Normal rate.     Pulses: Normal pulses.     Heart sounds: No murmur.  Pulmonary:     Effort: Pulmonary effort is normal. No respiratory distress.     Breath sounds: No stridor. No wheezing, rhonchi or rales.  Chest:     Chest wall: No tenderness.  Abdominal:     General: Abdomen is flat. There is no distension.     Tenderness: There is no abdominal tenderness. There is no rebound.  Musculoskeletal:        General: No tenderness.  Skin:    General: Skin is warm.     Capillary Refill: Capillary refill takes less than 2 seconds.     Findings: No erythema or rash.  Neurological:     Mental Status: She is alert.     GCS: GCS eye subscore is 4. GCS verbal subscore is 5. GCS motor subscore is 6.     Cranial Nerves: No cranial nerve deficit, dysarthria or facial asymmetry.     Sensory: No sensory deficit.     Motor: No weakness, tremor, abnormal muscle tone or seizure activity.     Coordination: Coordination normal. Finger-Nose-Finger Test normal.     Gait: Gait is intact. Gait normal.     Deep Tendon Reflexes: Reflexes are normal and symmetric. Reflexes normal.     Comments: Patient asking repetitive questions and is not making new memories here in the emergency department.      ED Treatments / Results  Labs (all labs ordered are listed, but only abnormal results are displayed) Labs Reviewed  COMPREHENSIVE METABOLIC PANEL - Abnormal; Notable for the following components:      Result Value   CO2 21 (*)     Glucose, Bld 101 (*)    All other components within normal limits  URINALYSIS, ROUTINE W REFLEX MICROSCOPIC - Abnormal; Notable for the following components:   Hgb urine dipstick SMALL (*)    Ketones, ur 5 (*)    Bacteria, UA RARE (*)    All other  components within normal limits  I-STAT CHEM 8, ED - Abnormal; Notable for the following components:   Calcium, Ion 1.10 (*)    All other components within normal limits  SARS CORONAVIRUS 2 (TAT 6-12 HRS)  PROTIME-INR  APTT  CBC  DIFFERENTIAL  RAPID URINE DRUG SCREEN, HOSP PERFORMED  CBG MONITORING, ED    EKG EKG Interpretation  Date/Time:  Sunday April 17 2019 14:27:29 EDT Ventricular Rate:  86 PR Interval:    QRS Duration: 92 QT Interval:  399 QTC Calculation: 475 R Axis:   -27 Text Interpretation:  Sinus rhythm Borderline left axis deviation No prior ECG for comparison.  No STEMI Confirmed by Antony Blackbird [id number]) on 04/17/2019 2:41:15 PM   Radiology Ct Head Code Stroke Wo Contrast  Result Date: 04/17/2019 CLINICAL DATA:  Code stroke.  Memory loss of acute onset. EXAM: CT HEAD WITHOUT CONTRAST TECHNIQUE: Contiguous axial images were obtained from the base of the skull through the vertex without intravenous contrast. COMPARISON:  None. FINDINGS: Brain: Mild chronic small-vessel change affects the pons. There are benign calcifications associated with the fourth ventricular choroid. No cerebellar abnormality. Cerebral hemispheres show scattered areas of low-density in the white matter consistent with chronic small vessel ischemic change. No sign of cortical infarction, mass lesion, hemorrhage, hydrocephalus or extra-axial collection. Vascular: No abnormal vascular finding. Skull: Normal Sinuses/Orbits: Clear/normal Other: None ASPECTS ([place] Stroke Program Early CT Score) - Ganglionic level infarction (caudate, lentiform nuclei, internal capsule, insula, M1-M3 cortex): 7 - Supraganglionic infarction (M4-M6 cortex): 3 Total score (0-10  with 10 being normal): 10 IMPRESSION: 1. No acute CT finding. Chronic small-vessel ischemic changes of the white matter. 2. ASPECTS is 10. 3. These results were communicated to Dr. Cheral Marker at 2:18 pmon 8/30/2020by text page via the [facility] messaging system. Electronically Signed   By: Nelson Chimes M.D.   On: 04/17/2019 14:20    Procedures Procedures (including critical care time)  Medications Ordered in ED Medications  sodium chloride flush (NS) 0.9 % injection 3 mL (3 mLs Intravenous Given 04/17/19 1438)     Initial Impression / Assessment and Plan / ED Course  I have reviewed the triage vital signs and the nursing notes.  Pertinent labs & imaging results that were available during my care of the patient were reviewed by me and considered in my medical decision making (see chart for details).        Sara Coffey is a 68 y.o. female with a past medical history significant for hyperlipidemia who presents as a code stroke.  Patient is brought in by EMS she reports that she was last normal at 1:10 PM.  Patient started having repetitive questioning and memory problems.  Patient was having amnesia.  She is never had any stroke or TIA in the past.  EMS did not find other focal neurologic deficits.  On my first evaluation, patient is already received a head CT and has been eval by neurology.  On my exam, patient is symmetric grip strength and sensation in arms.  Normal strength and sensation in legs.  Normal gait.  Normal extraocular movements and pupil exam.  Lungs clear chest nontender.  Abdomen nontender.  Patient resting comfortably but is asking repetitive questions.  Patient was unable to remember conversations earlier during the encounter.  Neurology feels patient likely has transient global amnesia.  Neurology recommends MRI, EEG, and admission for further management.   Final Clinical Impressions(s) / ED Diagnoses   Final diagnoses:  Memory deficit  Clinical Impression: 1.  Memory deficit     Disposition: Admit  This note was prepared with assistance of Dragon voice recognition software. Occasional wrong-word or sound-a-like substitutions may have occurred due to the inherent limitations of voice recognition software.      Tegeler, Gwenyth Allegra, MD 04/17/19 [phone]

## 2019-04-17 NOTE — ED Notes (Signed)
Canceled code stroke with Cassie from [place]

## 2019-04-17 NOTE — ED Triage Notes (Signed)
Pt brought in by [facility] as a code stroke. Pt LNW 1310, pt having repetitive questioning. Pt continues to ask "who called the ambulance, my husband?". Pt has no other focal neuro deficits. Pt grips equal, no facial droop. Pt continues to state "oh god I had a stroke didn't I?". Pt gait steady and normal. Pt A+Ox4, but has some difficulty recalling the year and month.

## 2019-04-17 NOTE — ED Notes (Signed)
Patient transported to MRI 

## 2019-04-17 NOTE — H&P (Addendum)
History and Physical    Sara Coffey PRF:[id number] DOB: May 25, 1951 DOA: 04/17/2019  PCP: Patient, No Pcp Per  Patient coming from: Home  I have personally briefly reviewed patient's old medical records in [place]  Chief Complaint: Acute onset of memory loss.  HPI: Sara Coffey is a 68 y.o. female with medical history significant of hyperlipidemia, syncope, and tobacco abuse with arthritis of the knee who presents to the emergency department with the acute onset of memory loss while sitting in the kitchen at home.  Her husband called EMS and on route she kept asking the same questions including "that I have a stroke" and where we going.  His questions were interspersed with comments that she was thirsty.  In the emergency department personnel had an extensive discussion with her about her tattoo when they returned and mention the tattoo again and asked her if they talked about it she said no and then proceeded to tell them the entire history of her tattoo.  She seems to forget what she is told and then asks questions (the same ones) again later.  In the emergency department no neurological deficits were noted except for the antegrade amnesia.  Last known well was 1:10 PM today.  Nursing has given the patient a piece of paper with the events of today listed on it.  Every time she looks at it it is as if she is reading for the first time that she has lost her memory.  She denies any chest pain, shortness of breath, headache, blurry vision, double vision, cough, sputum production, fevers, chills, nausea, vomiting, diarrhea, dysuria, urinary frequency, abdominal pain or discomfort.  ED Course: Exam unremarkable, stat CT scan for code stroke unremarkable, seen by Dr. Cheral Marker who recommended an MRI of the brain without contrast and EEG as well as urine drug screen.  Referred to me for further evaluation and management.  Review of Systems: As per HPI otherwise all other systems reviewed and   negative.   Past Medical History:  Diagnosis Date   Hyperlipidemia    Syncope and collapse    Tobacco abuse     Past Surgical History:  Procedure Laterality Date   KNEE SX     left eye surgery      TUBAL LIGATION      Social History   Social History Narrative   Not on file     reports that she has been smoking cigarettes. She has a 20.00 pack-year smoking history. She has never used smokeless tobacco. She reports that she does not drink alcohol or use drugs.  No Known Allergies  Family History  Problem Relation Age of Onset   Heart disease Father    Hypertension Father    Diabetes Father    Hyperlipidemia Brother    Breast cancer Neg Hx      Prior to Admission medications   Medication Sig Start Date End Date Taking? Authorizing Provider  calcium carbonate (OS-CAL) 1250 MG chewable tablet Chew 1 tablet by mouth as needed for heartburn.     [provider]  hydrocortisone (ANUSOL-HC) 25 MG suppository Place 1 suppository (25 mg total) rectally 2 (two) times daily. Patient not taking: Reported on 12/18/2016 09/28/13   Lorayne Marek, MD  lovastatin (MEVACOR) 40 MG tablet Take 40 mg by mouth at bedtime.    [provider]  multivitamin [facility]) per tablet Take 1 tablet by mouth daily.    [provider]  nicotine (NICODERM CQ) 21  mg/24hr patch Place 1 patch (21 mg total) onto the skin daily. Patient not taking: Reported on 12/18/2016 09/28/13   Lorayne Marek, MD    Physical Exam:  Constitutional: NAD, calm, comfortable Vitals:   04/17/19 1438 04/17/19 1445 04/17/19 1500 04/17/19 1600  BP:   (!) 156/70 133/76  Pulse:  83 83 82  Resp:  12 16 15   SpO2:  100% 100% 100%  Weight: 60.8 kg     Height: 5\' 3"  (1.6 m)      Eyes: PERRL, lids and conjunctivae normal ENMT: Mucous membranes are moist. Posterior pharynx clear of any exudate or lesions.Normal dentition.  Neck: normal, supple, no masses, no thyromegaly Respiratory: clear  to auscultation bilaterally, no wheezing, no crackles. Normal respiratory effort. No accessory muscle use.  Cardiovascular: Regular rate and rhythm,I/IV SEM (new) / rubs / gallops. No extremity edema. 2+ pedal pulses. No carotid bruits.  Abdomen: no tenderness, no masses palpated. No hepatosplenomegaly. Bowel sounds positive.  Musculoskeletal: no clubbing / cyanosis. No joint deformity upper and lower extremities. Good ROM, no contractures. Normal muscle tone.  Skin: no rashes, lesions, ulcers. No induration Neurologic: CN 2-12 grossly intact. Sensation intact, DTR normal. Strength 5/5 in all 4.  Psychiatric: Normal judgment and insight. Alert and oriented x 3. Normal mood.  Complete antegrade amnesia starting at approximately 110 pm today  Labs on Admission: I have personally reviewed following labs and imaging studies  CBC: Recent Labs  Lab 04/17/19 1412 04/17/19 1421  WBC 5.3  --   NEUTROABS 3.3  --   HGB 14.0 13.9  HCT 41.4 41.0  MCV 93.7  --   PLT 266  --    Basic Metabolic Panel: Recent Labs  Lab 04/17/19 1412 04/17/19 1421  NA 138 138  K 4.5 4.5  CL 104 107  CO2 21*  --   GLUCOSE 101* 96  BUN 8 8  CREATININE 0.80 0.70  CALCIUM 9.4  --    GFR: Estimated Creatinine Clearance: 58 mL/min (by C-G formula based on SCr of 0.7 mg/dL). Liver Function Tests: Recent Labs  Lab 04/17/19 1412  AST 21  ALT 13  ALKPHOS 66  BILITOT 1.1  PROT 6.8  ALBUMIN 4.2   Coagulation Profile: Recent Labs  Lab 04/17/19 1412  INR 1.0   CBG: Recent Labs  Lab 04/17/19 1411  GLUCAP 96   Urine analysis:    Component Value Date/Time   COLORURINE YELLOW 04/17/2019 1436   APPEARANCEUR CLEAR 04/17/2019 1436   LABSPEC 1.009 04/17/2019 1436   PHURINE 7.0 04/17/2019 1436   GLUCOSEU NEGATIVE 04/17/2019 1436   HGBUR SMALL (A) 04/17/2019 1436   BILIRUBINUR NEGATIVE 04/17/2019 1436   KETONESUR 5 (A) 04/17/2019 1436   PROTEINUR NEGATIVE 04/17/2019 1436   NITRITE NEGATIVE 04/17/2019  1436   LEUKOCYTESUR NEGATIVE 04/17/2019 1436    Radiological Exams on Admission: Mr Brain Wo Contrast  Result Date: 04/17/2019 CLINICAL DATA:  Suspected transient global amnesia. Symptoms began earlier today. EXAM: MRI HEAD WITHOUT CONTRAST TECHNIQUE: Multiplanar, multiecho pulse sequences of the brain and surrounding structures were obtained without intravenous contrast. COMPARISON:  Head CT at 1415 hours was negative. FINDINGS: Brain: No evidence for acute infarction, hemorrhage, mass lesion, hydrocephalus, or extra-axial fluid. Mild cerebral and cerebellar atrophy. Mild to moderate T2 and FLAIR hyperintensities in the white matter, likely small vessel disease. Vascular: Normal flow voids. Skull and upper cervical spine: Normal marrow signal. Sinuses/Orbits: No acute findings. Other: Negative. IMPRESSION: Mild atrophy.  Mild to moderate small vessel  disease. No acute intracranial findings. Specifically no evidence of acute stroke. Consistent with the Neurology clinical impression, typically imaging findings in patients with transient global amnesia are negative. Electronically Signed   By: Staci Righter M.D.   On: 04/17/2019 16:03   Ct Head Code Stroke Wo Contrast  Result Date: 04/17/2019 CLINICAL DATA:  Code stroke.  Memory loss of acute onset. EXAM: CT HEAD WITHOUT CONTRAST TECHNIQUE: Contiguous axial images were obtained from the base of the skull through the vertex without intravenous contrast. COMPARISON:  None. FINDINGS: Brain: Mild chronic small-vessel change affects the pons. There are benign calcifications associated with the fourth ventricular choroid. No cerebellar abnormality. Cerebral hemispheres show scattered areas of low-density in the white matter consistent with chronic small vessel ischemic change. No sign of cortical infarction, mass lesion, hemorrhage, hydrocephalus or extra-axial collection. Vascular: No abnormal vascular finding. Skull: Normal Sinuses/Orbits: Clear/normal Other:  None ASPECTS ([place] Stroke Program Early CT Score) - Ganglionic level infarction (caudate, lentiform nuclei, internal capsule, insula, M1-M3 cortex): 7 - Supraganglionic infarction (M4-M6 cortex): 3 Total score (0-10 with 10 being normal): 10 IMPRESSION: 1. No acute CT finding. Chronic small-vessel ischemic changes of the white matter. 2. ASPECTS is 10. 3. These results were communicated to Dr. Cheral Marker at 2:18 pmon 8/30/2020by text page via the [facility] messaging system. Electronically Signed   By: Nelson Chimes M.D.   On: 04/17/2019 14:20    EKG: Independently reviewed.  Sinus rhythm with borderline left axis deviation.  No prior is available for comparison.  Assessment/Plan Principal Problem:   TIA (transient ischemic attack) Active Problems:   Amnesia   Heart murmur   Tobacco abuse   Hyperlipidemia    1.  TIA versus stroke: MRI has been done result is pending.  Patient will be given an aspirin and started on aspirin daily.  I have ordered carotid Dopplers, EEG, and echocardiogram.  2.  Antegrade amnesia: Patient remembers nothing after 1:10 PM today.  Likely related to TIA versus stroke.  Hopefully this will improve over time.  3.  Apical systolic heart murmur: Sounds like she may have a mitral murmur.  Echocardiogram has been ordered.  This is a new diagnosis.  4.  Tobacco abuse: Patient states that she has been smoking a pack a day for about 50 years.  She requests a nicotine patch.  I did counsel her with regards to smoking cessation and a smoking cessation program at discharge.  Smoking cessation counseling 5 minutes.  5.  Hyperlipidemia: Is a diagnosis the patient has historically.  She is not on any medications.  Her daughter who is a Marine scientist does not recall a diagnosis of hyperlipidemia.  Will check a fasting lipid panel.   DVT prophylaxis: Enoxaparin Code Status: Full code Family Communication: Patient's daughter who was present at admission and is a Marine scientist at [employer]  [employer]. Disposition Plan: Likely home in a.m. Consults called: Neurology Admission status: It is my clinical opinion that referral for OBSERVATION is reasonable and necessary in this patient based on the above information provided. The aforementioned taken together are felt to place the patient at high risk for further clinical deterioration. However it is anticipated that the patient may be medically stable for discharge from the hospital within 24 to 48 hours.    Lady Deutscher MD FACP [facility] Pager [phone]  How to contact the [facility] Attending or Consulting provider [place] or covering provider during after hours [place], for this patient?  1.  Check the care team in [facility] and look for a) attending/consulting [facility] provider listed and b) the [facility] team listed 2. Log into [email or web address] and use [place]'s universal password to access. If you do not have the password, please contact the hospital operator. 3. Locate the [facility] provider you are looking for under [facility] and page to a number that you can be directly reached. 4. If you still have difficulty reaching the provider, please page the [facility] (Director on Call) for the Hospitalists listed on amion for assistance.  If 7PM-7AM, please contact night-coverage [email or web address] Password [email or web address]  04/17/2019, 4:15 PM

## 2019-04-17 NOTE — Consult Note (Signed)
NEURO HOSPITALIST CONSULT NOTE   Requestig physician: Dr. Sherry Ruffing  Reason for Consult: Acute onset of memory loss  History obtained from:  EMS and Chart     HPI:                                                                                                                                          Sara Coffey is an 68 y.o. female with no stroke or seizure history who presents with acute onset of memory loss while sitting in the kitchen at home. LKN was 1310. Husband called EMS. En route, she kept asking the same questions, including "did I have a stroke?" and "where are we going?", interspersed with comment that she was thirsty. EMS would give her answers to her questions and a few minutes later she would again ask the same questions apparently having forgotten the answers. EMS did not notice any other neurological deficits.   Past Medical History:  Diagnosis Date  . Hyperlipidemia   . Syncope and collapse   . Tobacco abuse     Past Surgical History:  Procedure Laterality Date  . KNEE SX    . left eye surgery     . TUBAL LIGATION      Family History  Problem Relation Age of Onset  . Heart disease Father   . Hypertension Father   . Diabetes Father   . Hyperlipidemia Brother   . Breast cancer Neg Hx               Social History:  reports that she has been smoking cigarettes. She has a 20.00 pack-year smoking history. She has never used smokeless tobacco. She reports that she does not drink alcohol or use drugs.  No Known Allergies  MEDICATIONS:                                                                                                                     Calcium carbonate Anusol-HC Lovastatin MVI Nicotine patch  ROS:  Unable to obtain a reliable ROS in the context of anterograde amnesia. She is not in any pain.    Weight 60.1 kg.  General Examination:                                                                                                       Physical Exam  HEENT-  [place]/AT   Lungs- Respirations unlabored Extremities- No edema  Neurological Examination Mental Status: Awake and alert. Oriented to self, day, city, county and state. Has difficulty recalling the year.  Speech fluent with intact naming. Has difficulty completing a 3-step directional command. Able to recite months of the year forwards but has significant difficulty as well as errors trying to do same backwards. No dysarthria. Anxious affect with pressured speech. Rapid forgetting noted. She will ask a question and following an answer, will ask it again after less than a minute. A typical statement is "have I had a stroke?" as well as "I've had a stroke" which she repeats again multiple times after having been told that she probably has not had a stroke. She has intact long term memory, able to recall the details of when and why she had a specific tattoo placed, but cannot recall telling her story after a 3 minute delay.  Cranial Nerves: II: Visual fields intact. Fixates and tracks normally.  PERRL III,IV, VI: EOMI with saccadic pursuits noted. No nystagmus.  V,VII: No facial droop. Facial temp sensation equal bilaterally  VIII: hearing intact to conversation IX,X: No hypophonia XI: Symmetric XII: midline tongue extension Motor: Right : Upper extremity   5/5    Left:     Upper extremity   5/5  Lower extremity   5/5     Lower extremity   5/5 Normal tone throughout; no atrophy noted Sensory: Temp and light touch intact throughout, bilaterally Deep Tendon Reflexes: 2+ and symmetric throughout Plantars: Right: downgoing   Left: downgoing Cerebellar: No ataxia with FNF bilaterally  Gait: Normal gait and station.   Lab Results: Basic Metabolic Panel: No results for input(s): NA, K, CL, CO2, GLUCOSE, BUN, CREATININE, CALCIUM, MG,  PHOS in the last 168 hours.  CBC: No results for input(s): WBC, NEUTROABS, HGB, HCT, MCV, PLT in the last 168 hours.  Cardiac Enzymes: No results for input(s): CKTOTAL, CKMB, CKMBINDEX, TROPONINI in the last 168 hours.  Lipid Panel: No results for input(s): CHOL, TRIG, HDL, CHOLHDL, VLDL, LDLCALC in the last 168 hours.  Imaging: No results found.  Assessment: 68 year old female with presenting symptoms and signs most consistent with transient global amnesia (TGA) 1. Exam reveals anterograde memory loss. No other neurological findings.  2. No history of stroke or seizure. 3. Positive for smoking.   Recommendations: 1. MRI brain without contrast.  2. EEG 3. IVF 4. Urine toxicology screen 5. EtOH level   Electronically signed: Dr. Kerney Elbe 04/17/2019, 2:13 PM

## 2019-04-18 ENCOUNTER — Observation Stay (HOSPITAL_COMMUNITY): Payer: Medicare HMO

## 2019-04-18 ENCOUNTER — Encounter (HOSPITAL_COMMUNITY): Payer: Self-pay

## 2019-04-18 ENCOUNTER — Observation Stay (HOSPITAL_BASED_OUTPATIENT_CLINIC_OR_DEPARTMENT_OTHER): Payer: Medicare HMO

## 2019-04-18 DIAGNOSIS — G454 Transient global amnesia: Secondary | ICD-10-CM

## 2019-04-18 DIAGNOSIS — G459 Transient cerebral ischemic attack, unspecified: Secondary | ICD-10-CM

## 2019-04-18 DIAGNOSIS — R011 Cardiac murmur, unspecified: Secondary | ICD-10-CM

## 2019-04-18 DIAGNOSIS — R411 Anterograde amnesia: Secondary | ICD-10-CM | POA: Diagnosis not present

## 2019-04-18 HISTORY — DX: Transient global amnesia: G45.4

## 2019-04-18 LAB — ETHANOL: Alcohol, Ethyl (B): <10 mg/dL (ref <10)

## 2019-04-18 LAB — LIPID PANEL
Cholesterol: 241 mg/dL — ABNORMAL HIGH (ref 0–200)
HDL: 50 mg/dL (ref >40)
LDL Cholesterol: 171 mg/dL — ABNORMAL HIGH (ref 0–99)
Total CHOL/HDL Ratio: 4.8 RATIO
Triglycerides: 101 mg/dL (ref <150)
VLDL: 20 mg/dL (ref 0–40)

## 2019-04-18 LAB — HIV ANTIBODY (ROUTINE TESTING W REFLEX): HIV Screen 4th Generation wRfx: NONREACTIVE

## 2019-04-18 LAB — HEMOGLOBIN A1C
Hgb A1c MFr Bld: 4.9 % (ref 4.8–5.6)
Mean Plasma Glucose: 93.93 mg/dL

## 2019-04-18 LAB — ECHOCARDIOGRAM COMPLETE
Height: 63 in
Weight: 2144 oz

## 2019-04-18 NOTE — Progress Notes (Signed)
EEG complete - results pending 

## 2019-04-18 NOTE — Discharge Summary (Signed)
Physician Discharge Summary  Sara Coffey XBJ:[id number] DOB: [birth year] DOA: 04/17/2019  PCP: Patient, No Pcp Per  Admit date: 04/17/2019 Discharge date: 04/18/2019  Admitted From: home Discharge disposition: home   Recommendations for Outpatient Follow-Up:   1. Healthy stress management 2. Lifestyle modifications for LDL : 171 3. Tobacco cessation    Discharge Diagnosis:   Principal Problem:   TGA (transient global amnesia) Active Problems:   Tobacco abuse   Hyperlipidemia    Discharge Condition: Improved.  Diet recommendation: Low sodium, heart healthy  Wound care: None.  Code status: Full.   History of Present Illness:   Sara Coffey is a 68 y.o. female with medical history significant of hyperlipidemia, syncope, and tobacco abuse with arthritis of the knee who presents to the emergency department with the acute onset of memory loss while sitting in the kitchen at home.  Her husband called EMS and on route she kept asking the same questions including "that I have a stroke" and where we going.  His questions were interspersed with comments that she was thirsty.  In the emergency department personnel had an extensive discussion with her about her tattoo when they returned and mention the tattoo again and asked her if they talked about it she said no and then proceeded to tell them the entire history of her tattoo.  She seems to forget what she is told and then asks questions (the same ones) again later.  In the emergency department no neurological deficits were noted except for the antegrade amnesia.  Last known well was 1:10 PM today.  Nursing has given the patient a piece of paper with the events of today listed on it.  Every time she looks at it it is as if she is reading for the first time that she has lost her memory.   Hospital Course by Problem:   TGA MRI negative -EEG non-specific -neurology consult appreciated -echo: left ventricle has normal  systolic function -patient reports tremendous amounts of stress surrounding caring for her mother and her mother's wish to go to the beach   Tobacco abuse:  -encouraged cessation   Hyperlipidemia: Is a diagnosis the patient has historically.  She is not on any medications.   LDL elevated-- defer to PCP   Medical Consultants:   neurology   Discharge Exam:   Vitals:   04/18/19 0700 04/18/19 1300  BP: 120/72 116/79  Pulse: 74 75  Resp: 14 17  Temp: 98.3 F (36.8 C) 98.2 F (36.8 C)  SpO2: 100% 100%   Vitals:   04/18/19 0339 04/18/19 0400 04/18/19 0700 04/18/19 1300  BP: (!) 150/68  120/72 116/79  Pulse: 65  74 75  Resp: 14 14 14 17   Temp:   98.3 F (36.8 C) 98.2 F (36.8 C)  TempSrc:   Axillary Oral  SpO2: 100%  100% 100%  Weight:      Height:        General exam: Appears calm and comfortable.   The results of significant diagnostics from this hospitalization (including imaging, microbiology, ancillary and laboratory) are listed below for reference.     Procedures and Diagnostic Studies:   Mr Brain Wo Contrast  Result Date: 04/17/2019 CLINICAL DATA:  Suspected transient global amnesia. Symptoms began earlier today. EXAM: MRI HEAD WITHOUT CONTRAST TECHNIQUE: Multiplanar, multiecho pulse sequences of the brain and surrounding structures were obtained without intravenous contrast. COMPARISON:  Head CT at 1415 hours was negative. FINDINGS: Brain: No evidence  for acute infarction, hemorrhage, mass lesion, hydrocephalus, or extra-axial fluid. Mild cerebral and cerebellar atrophy. Mild to moderate T2 and FLAIR hyperintensities in the white matter, likely small vessel disease. Vascular: Normal flow voids. Skull and upper cervical spine: Normal marrow signal. Sinuses/Orbits: No acute findings. Other: Negative. IMPRESSION: Mild atrophy.  Mild to moderate small vessel disease. No acute intracranial findings. Specifically no evidence of acute stroke. Consistent with the Neurology  clinical impression, typically imaging findings in patients with transient global amnesia are negative. Electronically Signed   By: Staci Righter M.D.   On: 04/17/2019 16:03   Ct Head Code Stroke Wo Contrast  Result Date: 04/17/2019 CLINICAL DATA:  Code stroke.  Memory loss of acute onset. EXAM: CT HEAD WITHOUT CONTRAST TECHNIQUE: Contiguous axial images were obtained from the base of the skull through the vertex without intravenous contrast. COMPARISON:  None. FINDINGS: Brain: Mild chronic small-vessel change affects the pons. There are benign calcifications associated with the fourth ventricular choroid. No cerebellar abnormality. Cerebral hemispheres show scattered areas of low-density in the white matter consistent with chronic small vessel ischemic change. No sign of cortical infarction, mass lesion, hemorrhage, hydrocephalus or extra-axial collection. Vascular: No abnormal vascular finding. Skull: Normal Sinuses/Orbits: Clear/normal Other: None ASPECTS (Powers Stroke Program Early CT Score) - Ganglionic level infarction (caudate, lentiform nuclei, internal capsule, insula, M1-M3 cortex): 7 - Supraganglionic infarction (M4-M6 cortex): 3 Total score (0-10 with 10 being normal): 10 IMPRESSION: 1. No acute CT finding. Chronic small-vessel ischemic changes of the white matter. 2. ASPECTS is 10. 3. These results were communicated to Dr. Cheral Marker at 2:18 pmon 8/30/2020by text page via the [facility] messaging system. Electronically Signed   By: Nelson Chimes M.D.   On: 04/17/2019 14:20   Vas US Carotid (at [place] Only)  Result Date: 04/18/2019 Carotid Arterial Duplex Study Indications:       TIA and Syncope. Risk Factors:      Hypertension, current smoker. Other Factors:     MRI: no acute infarcts. Comparison Study:  no prior Performing Technologist: June Leap RDMS, RVT  Examination Guidelines: A complete evaluation includes B-mode imaging, spectral Doppler, color Doppler, and power Doppler as needed of all  accessible portions of each vessel. Bilateral testing is considered an integral part of a complete examination. Limited examinations for reoccurring indications may be performed as noted.  Right Carotid Findings: +----------+--------+--------+--------+------------------+--------+             PSV cm/s EDV cm/s Stenosis Plaque Description Comments  +----------+--------+--------+--------+------------------+--------+  CCA Prox   93       15                                             +----------+--------+--------+--------+------------------+--------+  CCA Distal 79       26                                             +----------+--------+--------+--------+------------------+--------+  ICA Prox   73       21       Normal                                +----------+--------+--------+--------+------------------+--------+  ICA Distal 105  28                                             +----------+--------+--------+--------+------------------+--------+  ECA        130      12                                             +----------+--------+--------+--------+------------------+--------+ +----------+--------+-------+----------------+-------------------+             PSV cm/s EDV cms Describe         Arm Pressure (mmHG)  +----------+--------+-------+----------------+-------------------+  Subclavian 105              Multiphasic, WNL                      +----------+--------+-------+----------------+-------------------+ +---------+--------+--+--------+--+---------+  Vertebral PSV cm/s 69 EDV cm/s 24 Antegrade  +---------+--------+--+--------+--+---------+  Left Carotid Findings: +----------+--------+--------+--------+------------------+--------+             PSV cm/s EDV cm/s Stenosis Plaque Description Comments  +----------+--------+--------+--------+------------------+--------+  CCA Prox   82       21                                             +----------+--------+--------+--------+------------------+--------+  CCA Distal 94        23                                             +----------+--------+--------+--------+------------------+--------+  ICA Prox   127      32       Normal                                +----------+--------+--------+--------+------------------+--------+  ICA Distal 93       24                                             +----------+--------+--------+--------+------------------+--------+  ECA        106      24                                             +----------+--------+--------+--------+------------------+--------+ +----------+--------+--------+----------------+-------------------+             PSV cm/s EDV cm/s Describe         Arm Pressure (mmHG)  +----------+--------+--------+----------------+-------------------+  Subclavian 146               Multiphasic, WNL                      +----------+--------+--------+----------------+-------------------+ +---------+--------+--+--------+--+---------+  Vertebral PSV cm/s 59 EDV cm/s 15 Antegrade  +---------+--------+--+--------+--+---------+  Summary: Right Carotid: The extracranial vessels were near-normal with only minimal wall  thickening or plaque. Left Carotid: The extracranial vessels were near-normal with only minimal wall               thickening or plaque. Vertebrals:  Bilateral vertebral arteries demonstrate antegrade flow. Subclavians: Normal flow hemodynamics were seen in bilateral subclavian              arteries. *See table(s) above for measurements and observations.  Electronically signed by Antony Contras MD on 04/18/2019 at 1:05:05 PM.    Final      Labs:   Basic Metabolic Panel: Recent Labs  Lab 04/17/19 1412 04/17/19 1421  NA 138 138  K 4.5 4.5  CL 104 107  CO2 21*  --   GLUCOSE 101* 96  BUN 8 8  CREATININE 0.80 0.70  CALCIUM 9.4  --    GFR Estimated Creatinine Clearance: 58 mL/min (by C-G formula based on SCr of 0.7 mg/dL). Liver Function Tests: Recent Labs  Lab 04/17/19 1412  AST 21  ALT 13  ALKPHOS 66    BILITOT 1.1  PROT 6.8  ALBUMIN 4.2   No results for input(s): LIPASE, AMYLASE in the last 168 hours. No results for input(s): AMMONIA in the last 168 hours. Coagulation profile Recent Labs  Lab 04/17/19 1412  INR 1.0    CBC: Recent Labs  Lab 04/17/19 1412 04/17/19 1421  WBC 5.3  --   NEUTROABS 3.3  --   HGB 14.0 13.9  HCT 41.4 41.0  MCV 93.7  --   PLT 266  --    Cardiac Enzymes: No results for input(s): CKTOTAL, CKMB, CKMBINDEX, TROPONINI in the last 168 hours. BNP: Invalid input(s): POCBNP CBG: Recent Labs  Lab 04/17/19 1411  GLUCAP 96   D-Dimer No results for input(s): DDIMER in the last 72 hours. Hgb A1c Recent Labs    04/18/19 0451  HGBA1C 4.9   Lipid Profile Recent Labs    04/18/19 0451  CHOL 241*  HDL 50  LDLCALC 171*  TRIG 101  CHOLHDL 4.8   Thyroid function studies No results for input(s): TSH, T4TOTAL, T3FREE, THYROIDAB in the last 72 hours.  Invalid input(s): FREET3 Anemia work up No results for input(s): VITAMINB12, FOLATE, FERRITIN, TIBC, IRON, RETICCTPCT in the last 72 hours. Microbiology Recent Results (from the past 240 hour(s))  SARS CORONAVIRUS 2 (TAT 6-12 HRS) Nasal Swab Aptima Multi Swab     Status: None   Collection Time: 04/17/19  4:12 PM   Specimen: Aptima Multi Swab; Nasal Swab  Result Value Ref Range Status   SARS Coronavirus 2 NEGATIVE NEGATIVE Final    Comment: (NOTE) SARS-CoV-2 target nucleic acids are NOT DETECTED. The SARS-CoV-2 RNA is generally detectable in upper and lower respiratory specimens during the acute phase of infection. Negative results do not preclude SARS-CoV-2 infection, do not rule out co-infections with other pathogens, and should not be used as the sole basis for treatment or other patient management decisions. Negative results must be combined with clinical observations, patient history, and epidemiological information. The expected result is Negative. Fact Sheet for  Patients: SugarRoll.be Fact Sheet for Healthcare Providers: https://www.woods-mathews.com/ This test is not yet approved or cleared by the [place] FDA and  has been authorized for detection and/or diagnosis of SARS-CoV-2 by FDA under an Emergency Use Authorization (EUA). This EUA will remain  in effect (meaning this test can be used) for the duration of the COVID-19 declaration under Section 56 4(b)(1) of the Act, 21 U.S.C. section 360bbb-3(b)(1), unless the authorization is  terminated or revoked sooner. Performed at [facility], [place] [address], [place], [place] [address]      Discharge Instructions:   Discharge Instructions    Diet general   Complete by: As directed    Increase activity slowly   Complete by: As directed      Allergies as of 04/18/2019   No Known Allergies     Medication List    STOP taking these medications   hydrocortisone 25 MG suppository Commonly known as: ANUSOL-HC   nicotine 21 mg/24hr patch Commonly known as: Nicoderm CQ     TAKE these medications   acetaminophen 500 MG tablet Commonly known as: TYLENOL Take 500 mg by mouth every 6 (six) hours as needed for headache.   Melatonin 5 MG Tabs Take 5 mg by mouth as needed (sleep).      Follow-up Information    establish with PCP Follow up.            Time coordinating discharge: 25 min  Signed:  Geradine Girt DO  [facility] 04/18/2019, 2:49 PM

## 2019-04-18 NOTE — Progress Notes (Addendum)
NEUROLOGY PROGRESS NOTE  Subjective: Patient has no complaints.  She has fully resolved at this point time.  She recites the fact that she knows there was a time.  Which she does not recall.  Exam: Vitals:   04/18/19 0400 04/18/19 0700  BP:  120/72  Pulse:  74  Resp: 14 14  Temp:  98.3 F (36.8 C)  SpO2:  100%    Physical Exam   HEENT-  Normocephalic, no lesions, without obvious abnormality.  Normal external eye and conjunctiva.   Extremities- Warm, dry and intact Musculoskeletal-no joint tenderness, deformity or swelling Skin-warm and dry, no hyperpigmentation, vitiligo, or suspicious lesions    Neuro:  Mental Status: Alert, oriented, thought content appropriate.  Speech fluent without evidence of aphasia.  Able to follow 3 step commands without difficulty. Cranial Nerves: II:  Visual fields grossly normal,  III,IV, VI: ptosis not present, extra-ocular motions intact bilaterally pupils equal, round, reactive to light and accommodation V,VII: smile symmetric, facial light touch sensation normal bilaterally VIII: hearing normal bilaterally IX,X: Palate rises midline XI: bilateral shoulder shrug XII: midline tongue extension Motor: Right : Upper extremity   5/5    Left:     Upper extremity   5/5  Lower extremity   5/5     Lower extremity   5/5 Tone and bulk:normal tone throughout; no atrophy noted Sensory: Pinprick and light touch intact throughout, bilaterally Deep Tendon Reflexes: 2+ and symmetric throughout   Medications:  Scheduled: . aspirin  300 mg Rectal Daily   Or  . aspirin  325 mg Oral Daily  . enoxaparin (LOVENOX) injection  40 mg Subcutaneous Daily  . nicotine  21 mg Transdermal Daily   Continuous:   Pertinent Labs/Diagnostics: Urine drug screen negative MRI negative EEG pending EtOH pending   Mr Brain Wo Contrast  Result Date: 04/17/2019 . IMPRESSION: Mild atrophy.  Mild to moderate small vessel disease. No acute intracranial findings.  Specifically no evidence of acute stroke. Consistent with the Neurology clinical impression, typically imaging findings in patients with transient global amnesia are negative. Electronically Signed   By: Sara Coffey M.D.   On: 04/17/2019 16:03   Ct Head Code Stroke Wo Contrast  Result Date: 04/17/2019  IMPRESSION: 1. No acute CT finding. Chronic small-vessel ischemic changes of the white matter. 2. ASPECTS is 10. 3. These results were communicated to Dr. Cheral Coffey at 2:18 pmon 8/30/2020by text page via the [facility] messaging system. Electronically Signed   By: Sara Coffey M.D.   On: 04/17/2019 14:20     Sara Quill PA-C [facility] [phone]   Assessment: 68 year old female with presenting with symptoms most consistent with transient global amnesia.  Today patient is fully resolved.  As stated she does recall having a period of time which she does not recall.   Recommendations: -EEG pending - If EEG is negative no further neurological testing at this time and neurology will sign off  Sara Coffey to attest this note  04/18/2019, 9:11 AM  I have seen the patient and reviewed the above note.  Her EEG has some right frontal slowing, no correlating structural lesion on MRI.  That being said, the significance of this is very unclear in this clinical setting given that her history was very much consistent with transient global amnesia.  I would not favor further evaluation of this at this time.  If she were to have further symptoms or recurrence, then could consider other evaluation at that time.  Sara Rack, MD  [facility] [phone]  If 7pm- 7am, please page neurology on call as listed in Man.

## 2019-04-18 NOTE — Evaluation (Signed)
Physical Therapy Evaluation Patient Details Name: Sara Coffey MRN: [id number] DOB: 04-07-1951 Today's Date: 04/18/2019   History of Present Illness  68 y.o. female with medical history significant of hyperlipidemia, syncope, and tobacco abuse with arthritis of the knee who presents to the emergency department with the acute onset of memory loss while sitting in the kitchen at home. Admitted for TIA.    Clinical Impression  PT eval complete. Pt is independent with all functional mobility. She scored 24/24 on DGI. Educated on [place]. No further PT intervention indicated. PT signing off.    Follow Up Recommendations No PT follow up    Equipment Recommendations  None recommended by PT    Recommendations for Other Services       Precautions / Restrictions Precautions Precautions: None Restrictions Weight Bearing Restrictions: No      Mobility  Bed Mobility Overal bed mobility: Independent                Transfers Overall transfer level: Independent Equipment used: None                Ambulation/Gait Ambulation/Gait assistance: Independent Gait Distance (Feet): 500 Feet Assistive device: None Gait Pattern/deviations: WFL(Within Functional Limits)   Gait velocity interpretation: >4.37 ft/sec, indicative of normal walking speed    Stairs Stairs: Yes Stairs assistance: Supervision Stair Management: No rails;Forwards Number of Stairs: 12 General stair comments: supervision for safety. No assist or LOB  Wheelchair Mobility    Modified Rankin (Stroke Patients Only) Modified Rankin (Stroke Patients Only) Pre-Morbid Rankin Score: No symptoms Modified Rankin: No symptoms     Balance Overall balance assessment: Independent                               Standardized Balance Assessment Standardized Balance Assessment : Dynamic Gait Index   Dynamic Gait Index Level Surface: Normal Change in Gait Speed: Normal Gait with Horizontal Head  Turns: Normal Gait with Vertical Head Turns: Normal Gait and Pivot Turn: Normal Step Over Obstacle: Normal Step Around Obstacles: Normal Steps: Normal Total Score: 24       Pertinent Vitals/Pain Pain Assessment: No/denies pain    Home Living Family/patient expects to be discharged to:: Private residence Living Arrangements: Spouse/significant other Available Help at Discharge: Family;Available 24 hours/day Type of Home: House Home Access: Stairs to enter Entrance Stairs-Rails: Psychiatric nurse of Steps: 4 Home Layout: One level Home Equipment: None      Prior Function Level of Independence: Independent               Hand Dominance        Extremity/Trunk Assessment   Upper Extremity Assessment Upper Extremity Assessment: Overall WFL for tasks assessed    Lower Extremity Assessment Lower Extremity Assessment: Overall WFL for tasks assessed    Cervical / Trunk Assessment Cervical / Trunk Assessment: Normal  Communication   Communication: No difficulties  Cognition Arousal/Alertness: Awake/alert Behavior During Therapy: WFL for tasks assessed/performed Overall Cognitive Status: Within Functional Limits for tasks assessed                                        General Comments General comments (skin integrity, edema, etc.): Pt educated on BEFAST.    Exercises     Assessment/Plan    PT Assessment Patent does not need any further PT services  PT Problem List         PT Treatment Interventions      PT Goals (Current goals can be found in the Care Plan section)  Acute Rehab PT Goals Patient Stated Goal: home today PT Goal Formulation: All assessment and education complete, DC therapy    Frequency     Barriers to discharge        Co-evaluation               AM-PAC PT "6 Clicks" Mobility  Outcome Measure Help needed turning from your back to your side while in a flat bed without using bedrails?:  None Help needed moving from lying on your back to sitting on the side of a flat bed without using bedrails?: None Help needed moving to and from a bed to a chair (including a wheelchair)?: None Help needed standing up from a chair using your arms (e.g., wheelchair or bedside chair)?: None Help needed to walk in hospital room?: None Help needed climbing 3-5 steps with a railing? : None 6 Click Score: 24    End of Session   Activity Tolerance: Patient tolerated treatment well Patient left: in chair Nurse Communication: Mobility status PT Visit Diagnosis: Unsteadiness on feet (R26.81)    Time: 4259-5638 PT Time Calculation (min) (ACUTE ONLY): 10 min   Charges:   PT Evaluation $PT Eval Low Complexity: 1 Low          Lorrin Goodell, PT  Office # [phone] Pager [phone]   Lorriane Shire 04/18/2019, 11:02 AM

## 2019-04-18 NOTE — TOC Initial Note (Signed)
Transition of Care [facility]) - Initial/Assessment Note    Patient Details  Name: ATLEE VILLERS MRN: [id number] Date of Birth: 05/14/1951  Transition of [facility]) CM/SW Contact:    Pollie Friar, RN Phone Number: 04/18/2019, 2:40 PM  Clinical Narrative:                 Pt denies issues with home medications or transportation.   Expected Discharge Plan: Home/Self Care Barriers to Discharge: No Barriers Identified   Patient Goals and CMS Choice        Expected Discharge Plan and Services Expected Discharge Plan: Home/Self Care         Expected Discharge Date: 04/18/19                                    Prior Living Arrangements/Services   Lives with:: Spouse Patient language and need for interpreter reviewed:: Yes(no needs) Do you feel safe going back to the place where you live?: Yes      Need for Family Participation in Patient Care: No (Comment) Care giver support system in place?: Yes (comment)(intermittent supervision)   Criminal Activity/Legal Involvement Pertinent to Current Situation/Hospitalization: No - Comment as needed  Activities of Daily Living Home Assistive Devices/Equipment: None ADL Screening (condition at time of admission) Patient's cognitive ability adequate to safely complete daily activities?: Yes Is the patient deaf or have difficulty hearing?: No Does the patient have difficulty seeing, even when wearing glasses/contacts?: No Does the patient have difficulty concentrating, remembering, or making decisions?: Yes Patient able to express need for assistance with ADLs?: Yes Does the patient have difficulty dressing or bathing?: No Independently performs ADLs?: Yes (appropriate for developmental age) Does the patient have difficulty walking or climbing stairs?: No Weakness of Legs: None Weakness of Arms/Hands: None  Permission Sought/Granted                  Emotional Assessment Appearance:: Appears stated  age Attitude/Demeanor/Rapport: Engaged Affect (typically observed): Accepting, Pleasant Orientation: : Oriented to Self, Oriented to Place, Oriented to  Time, Oriented to Situation   Psych Involvement: No (comment)  Admission diagnosis:  Memory deficit [R41.3] Patient Active Problem List   Diagnosis Date Noted  . TIA (transient ischemic attack) 04/17/2019  . Amnesia 04/17/2019  . Heart murmur 04/17/2019  . Hyperlipidemia 11/09/2013  . IFG (impaired fasting glucose) 11/09/2013  . Hemorrhoid 09/28/2013  . Tobacco abuse 09/28/2013  . Carneous mole 09/28/2013   PCP:  Patient, No Pcp Per Pharmacy:   [id number] - [place], [place] - [address] [place] [address] Phone: [phone] Fax: [phone]     Social Determinants of Health (SDOH) Interventions    Readmission Risk Interventions No flowsheet data found.

## 2019-04-18 NOTE — TOC Transition Note (Signed)
Transition of Care [facility]) - CM/SW Discharge Note   Patient Details  Name: Sara Coffey MRN: [id number] Date of Birth: 31-Dec-1950  Transition of Care [facility]) CM/SW Contact:  Pollie Friar, RN Phone Number: 04/18/2019, 2:40 PM   Clinical Narrative:    Pt is without a PCP. Pt has family member that works for the [employer] group and will assist her in getting a PcP.  Pt has supervision at home and transportation to home.    Final next level of care: Home/Self Care Barriers to Discharge: No Barriers Identified   Patient Goals and CMS Choice        Discharge Placement                       Discharge Plan and Services                                     Social Determinants of Health (SDOH) Interventions     Readmission Risk Interventions No flowsheet data found.

## 2019-04-18 NOTE — Progress Notes (Signed)
AVS reviewed with patient and patient given a copy to take home. Patient dressed, all lines removed, and belongings returned (cell phone, clothing). Patient taken by nurse via wheelchair to daughter's car for discharge.

## 2019-04-18 NOTE — Evaluation (Signed)
Occupational Therapy Evaluation Patient Details Name: Sara Coffey MRN: [id number] DOB: [birth year] Today's Date: 04/18/2019    History of Present Illness 68 y.o. female with medical history significant of hyperlipidemia, syncope, and tobacco abuse with arthritis of the knee who presents to the emergency department with the acute onset of memory loss while sitting in the kitchen at home. Admitted for TIA.   Clinical Impression   Pt currently independent with all self care tasks and functional mobility without use of AD. Pt reports not other areas of concern other than memory. Pt was able to describe prior PT and described BEFAST but unable to remember name of acronym. Pt has 24/7 S at home for safety with memory concerns. OT discussed possible need for medication box and someone to be present if pt is cooking for safety. Pt verbalized understanding. No OT follow up needed. OT will SIGN OFF.     Follow Up Recommendations  No OT follow up    Equipment Recommendations  None recommended by OT       Precautions / Restrictions Precautions Precautions: None Restrictions Weight Bearing Restrictions: No      Mobility Bed Mobility Overal bed mobility: Independent          Transfers Overall transfer level: Independent Equipment used: None                  Balance Overall balance assessment: Independent        Standardized Balance Assessment Standardized Balance Assessment : Dynamic Gait Index   Dynamic Gait Index Level Surface: Normal Change in Gait Speed: Normal Gait with Horizontal Head Turns: Normal Gait with Vertical Head Turns: Normal Gait and Pivot Turn: Normal Step Over Obstacle: Normal Step Around Obstacles: Normal Steps: Normal Total Score: 24     ADL either performed or assessed with clinical judgement   ADL Overall ADL's : Independent            Vision Baseline Vision/History: Wears glasses Wears Glasses: Distance only Patient Visual  Report: No change from baseline Vision Assessment?: No apparent visual deficits            Pertinent Vitals/Pain Pain Assessment: No/denies pain     Hand Dominance Right   Extremity/Trunk Assessment Upper Extremity Assessment Upper Extremity Assessment: Overall WFL for tasks assessed   Lower Extremity Assessment Lower Extremity Assessment: Overall WFL for tasks assessed   Cervical / Trunk Assessment Cervical / Trunk Assessment: Normal   Communication Communication Communication: No difficulties   Cognition Arousal/Alertness: Awake/alert Behavior During Therapy: WFL for tasks assessed/performed Overall Cognitive Status: Within Functional Limits for tasks assessed         General Comments  Pt educated on BEFAST.            Home Living Family/patient expects to be discharged to:: Private residence Living Arrangements: Spouse/significant other Available Help at Discharge: Family;Available 24 hours/day Type of Home: House Home Access: Stairs to enter [employer] of Steps: 4 Entrance Stairs-Rails: Right;Left Home Layout: One level     Bathroom Shower/Tub: Teacher, early years/pre: Standard     Home Equipment: None          Prior Functioning/Environment Level of Independence: Independent                          OT Goals(Current goals can be found in the care plan section) Acute Rehab OT Goals Patient Stated Goal: home today  OT Frequency:  AM-PAC OT "6 Clicks" Daily Activity     Outcome Measure Help from another person eating meals?: None Help from another person taking care of personal grooming?: None Help from another person toileting, which includes using toliet, bedpan, or urinal?: None Help from another person bathing (including washing, rinsing, drying)?: None Help from another person to put on and taking off regular upper body clothing?: None Help from another person to put on and taking off regular lower body  clothing?: None 6 Click Score: 24   End of Session    Activity Tolerance: Patient tolerated treatment well Patient left: in bed;with call bell/phone within reach                   Time: 1100-1111 OT Time Calculation (min): 11 min Charges:  OT General Charges $OT Visit: 1 Visit OT Evaluation $OT Eval Low Complexity: 1 Low  Melaya [place]elton P, MS, OTR/L 04/18/2019, 11:40 AM

## 2019-04-18 NOTE — Procedures (Signed)
Patient Name: Sara Coffey  MRN: [id number]  Epilepsy Attending: Lora Havens  Referring Physician/Provider: Dr Randa Spike Date: 04/18/2019 Duration: 25.28 mins  Patient history: 68 year old female with transient global amnesia.  EEG to evaluate for seizure.  Level of alertness: Awake  AEDs during EEG study: None  Technical aspects: This EEG study was done with scalp electrodes positioned according to the 10-20 International system of electrode placement. Electrical activity was acquired at a sampling rate of 500Hz  and reviewed with a high frequency filter of 70Hz  and a low frequency filter of 1Hz . EEG data were recorded continuously and digitally stored.   Description: The posterior dominant rhythm consists of 9-10 Hz activity of moderate voltage (25-35 uV) seen predominantly in posterior head regions, symmetric and reactive to eye opening and eye closing.         Drowsiness was characterized by attenuation of the posterior background rhythm and roving eye movements. There was intermittent 2-3 Hz delta slowing in right frontotemporal region.  Hyperventilation and photic stimulation were not performed.  ABNORMALITY: 1. Intermittent slow, right frontotemporal  IMPRESSION: This study is suggestive of mild cortical dysfunction in right frontotemporal region, non specific to etiology. No seizures or epileptiform discharges were seen throughout the recording.

## 2019-04-18 NOTE — Progress Notes (Signed)
  Echocardiogram 2D Echocardiogram has been performed.  Bobbye Charleston 04/18/2019, 8:52 AM

## 2019-04-18 NOTE — Progress Notes (Signed)
Patient in room doing Yoga.  She should be able to be d/c'd today after: EEG And echo MRI negative JV

## 2019-04-18 NOTE — Progress Notes (Signed)
Carotid duplex       has been completed. Preliminary results can be found under CV proc through chart review. Shekina Cordell, BS, RDMS, RVT   

## 2019-05-23 DIAGNOSIS — R69 Illness, unspecified: Secondary | ICD-10-CM | POA: Diagnosis not present

## 2019-05-26 DIAGNOSIS — R69 Illness, unspecified: Secondary | ICD-10-CM | POA: Diagnosis not present

## 2019-06-08 DIAGNOSIS — M1990 Unspecified osteoarthritis, unspecified site: Secondary | ICD-10-CM | POA: Diagnosis not present

## 2019-06-08 DIAGNOSIS — Z720 Tobacco use: Secondary | ICD-10-CM | POA: Diagnosis not present

## 2019-06-08 DIAGNOSIS — Z833 Family history of diabetes mellitus: Secondary | ICD-10-CM | POA: Diagnosis not present

## 2019-06-08 DIAGNOSIS — Z8249 Family history of ischemic heart disease and other diseases of the circulatory system: Secondary | ICD-10-CM | POA: Diagnosis not present

## 2019-09-19 ENCOUNTER — Ambulatory Visit (INDEPENDENT_AMBULATORY_CARE_PROVIDER_SITE_OTHER): Payer: Medicare HMO | Admitting: Nurse Practitioner

## 2019-09-19 ENCOUNTER — Other Ambulatory Visit: Payer: Self-pay

## 2019-09-19 ENCOUNTER — Encounter: Payer: Self-pay | Admitting: Nurse Practitioner

## 2019-09-19 VITALS — BP 110/70 | HR 85 | Temp 98.3°F | Resp 15 | Ht 64.0 in | Wt 136.4 lb

## 2019-09-19 DIAGNOSIS — E785 Hyperlipidemia, unspecified: Secondary | ICD-10-CM | POA: Diagnosis not present

## 2019-09-19 DIAGNOSIS — Z7689 Persons encountering health services in other specified circumstances: Secondary | ICD-10-CM | POA: Diagnosis not present

## 2019-09-19 DIAGNOSIS — Z13228 Encounter for screening for other metabolic disorders: Secondary | ICD-10-CM | POA: Diagnosis not present

## 2019-09-19 DIAGNOSIS — Z1231 Encounter for screening mammogram for malignant neoplasm of breast: Secondary | ICD-10-CM | POA: Diagnosis not present

## 2019-09-19 DIAGNOSIS — Z85828 Personal history of other malignant neoplasm of skin: Secondary | ICD-10-CM

## 2019-09-19 DIAGNOSIS — R739 Hyperglycemia, unspecified: Secondary | ICD-10-CM

## 2019-09-19 DIAGNOSIS — Z1211 Encounter for screening for malignant neoplasm of colon: Secondary | ICD-10-CM | POA: Diagnosis not present

## 2019-09-19 DIAGNOSIS — R937 Abnormal findings on diagnostic imaging of other parts of musculoskeletal system: Secondary | ICD-10-CM

## 2019-09-19 DIAGNOSIS — Z23 Encounter for immunization: Secondary | ICD-10-CM | POA: Diagnosis not present

## 2019-09-19 DIAGNOSIS — Z130 Encounter for screening for diseases of the blood and blood-forming organs and certain disorders involving the immune mechanism: Secondary | ICD-10-CM

## 2019-09-19 DIAGNOSIS — Z8739 Personal history of other diseases of the musculoskeletal system and connective tissue: Secondary | ICD-10-CM | POA: Insufficient documentation

## 2019-09-19 DIAGNOSIS — Z124 Encounter for screening for malignant neoplasm of cervix: Secondary | ICD-10-CM

## 2019-09-19 NOTE — Progress Notes (Signed)
New Patient Office Visit  Subjective:  Patient ID: Sara Coffey, female    DOB: 1951/03/04  Age: 69 y.o. MRN: [id number]  CC:  Chief Complaint  Patient presents with  . Establish Care    Has c/o hemmorhoid, needs colonoscopy    HPI Sara Coffey is a 69 yo Caucasian female presenting to establish care. Time spent reviewing mychart history, discussing PMH and experience. Elevated glucose on ER labs that I do not see a follow up on and will add A1C to establish care labwork . The pts health goals are to be as health as possible. Discussed disease prevention and pt will complete prev 13 today. On her f/u she will consider pnv23, shingles, and tetanus vaccine. She will call for appointment for DEXA, Mammogram, and GYN examination. GI referral for Cscope.  Pt will discuss a hemorrhoid at that visit.   Past Medical History:  Diagnosis Date  . Amnesia 04/17/2019  . Hemorrhoid 09/28/2013  . Hyperlipidemia   . Syncope and collapse   . TGA (transient global amnesia) 04/18/2019  . TIA (transient ischemic attack) 04/17/2019  . Tobacco abuse     Past Surgical History:  Procedure Laterality Date  . KNEE SX    . left eye surgery     . TUBAL LIGATION      Family History  Problem Relation Age of Onset  . Heart disease Father   . Hypertension Father   . Diabetes Father   . Hyperlipidemia Brother   . Breast cancer Neg Hx     Social History   Socioeconomic History  . Marital status: Single    Spouse name: Not on file  . Number of children: Not on file  . Years of education: Not on file  . Highest education level: Not on file  Occupational History  . Not on file  Tobacco Use  . Smoking status: Current Every Day Smoker    Packs/day: 1.00    Years: 40.00    Pack years: 40.00    Types: Cigarettes  . Smokeless tobacco: Never Used  Substance and Sexual Activity  . Alcohol use: No  . Drug use: No  . Sexual activity: Not on file  Other Topics Concern  . Not on file  Social  History Narrative  . Not on file   Social Determinants of Health   Financial Resource Strain:   . Difficulty of Paying Living Expenses: Not on file  Food Insecurity:   . Worried About Charity fundraiser in the Last Year: Not on file  . Ran Out of Food in the Last Year: Not on file  Transportation Needs:   . Lack of Transportation (Medical): Not on file  . Lack of Transportation (Non-Medical): Not on file  Physical Activity:   . Days of Exercise per Week: Not on file  . Minutes of Exercise per Session: Not on file  Stress:   . Feeling of Stress : Not on file  Social Connections:   . Frequency of Communication with Friends and Family: Not on file  . Frequency of Social Gatherings with Friends and Family: Not on file  . Attends Religious Services: Not on file  . Active Member of Clubs or Organizations: Not on file  . Attends Archivist Meetings: Not on file  . Marital Status: Not on file  Intimate Partner Violence:   . Fear of Current or Ex-Partner: Not on file  . Emotionally Abused: Not on file  . Physically  Abused: Not on file  . Sexually Abused: Not on file    ROS Review of Systems  All other systems reviewed and are negative.   Objective:   Today's Vitals: BP 110/70   Pulse 85   Temp 98.3 F (36.8 C) (Oral)   Resp 15   Ht 5\' 4"  (1.626 m)   Wt 136 lb 6 oz (61.9 kg)   SpO2 95%   BMI 23.41 kg/m   Physical Exam Vitals and nursing note reviewed.  Constitutional:      Appearance: Normal appearance. She is well-developed, well-groomed and normal weight.  HENT:     Head: Normocephalic.     Right Ear: Hearing, tympanic membrane, ear canal and external ear normal.     Left Ear: Hearing, tympanic membrane, ear canal and external ear normal.     Nose: Nose normal.     Mouth/Throat:     Mouth: Mucous membranes are dry.     Pharynx: Oropharynx is clear.     Comments: Upper and lower dentures  Eyes:     General: Lids are normal. Lids are everted, no  foreign bodies appreciated.  Cardiovascular:     Rate and Rhythm: Normal rate and regular rhythm.     Pulses: Normal pulses.     Heart sounds: Normal heart sounds, S1 normal and S2 normal.     Comments: No caroltid bru. Pulmonary:     Effort: Pulmonary effort is normal.     Breath sounds: Normal breath sounds.  Abdominal:     General: Abdomen is flat. Bowel sounds are normal.     Palpations: Abdomen is soft.  Musculoskeletal:        General: Normal range of motion.     Cervical back: Normal range of motion and neck supple.     Right lower leg: No edema.     Left lower leg: No edema.  Lymphadenopathy:     Head:     Right side of head: No submental, submandibular or tonsillar adenopathy.     Left side of head: No submental, submandibular or tonsillar adenopathy.     Cervical:     Right cervical: No deep cervical adenopathy.    Left cervical: No deep cervical adenopathy.  Skin:    General: Skin is warm and dry.     Capillary Refill: Capillary refill takes less than 2 seconds.  Neurological:     General: No focal deficit present.     Mental Status: She is alert and oriented to person, place, and time.     Cranial Nerves: Cranial nerves are intact.     Gait: Gait normal.  Psychiatric:        Mood and Affect: Mood and affect normal.        Speech: Speech normal.        Behavior: Behavior normal. Behavior is cooperative.        Thought Content: Thought content normal.        Cognition and Memory: Cognition and memory normal.        Judgment: Judgment normal.     Assessment & Plan:   Problem List Items Addressed This Visit      Other   Hyperlipidemia   Relevant Orders   Lipid panel   H/O: osteoarthritis   H/O squamous cell carcinoma of skin    Other Visit Diagnoses    Establishing care with new doctor, encounter for    -  Primary   Relevant Orders   CBC  with Differential/Platelet   Lipid panel   Hemoglobin A1c   COMPLETE METABOLIC PANEL WITH GFR   Screening for  deficiency anemia       Relevant Orders   CBC with Differential/Platelet   Screening for metabolic disorder       Relevant Orders   COMPLETE METABOLIC PANEL WITH GFR   Elevated serum glucose       Relevant Orders   Hemoglobin A1c   Breast cancer screening by mammogram       Relevant Orders   MM DIGITAL SCREENING BILATERAL   Colon cancer screening       Relevant Orders   Ambulatory referral to Gastroenterology   Cervical cancer screening       Relevant Orders   Ambulatory referral to Gynecology   Need for vaccination with 13-polyvalent pneumococcal conjugate vaccine       Relevant Orders   Pneumococcal conjugate vaccine 13-valent (Completed)      Outpatient Encounter Medications as of 09/19/2019  Medication Sig  . acetaminophen (TYLENOL) 500 MG tablet Take 500 mg by mouth every 6 (six) hours as needed for headache.  . calcium carbonate (TUMS - DOSED IN MG ELEMENTAL CALCIUM) 500 MG chewable tablet Chew 1 tablet by mouth as needed for indigestion or heartburn.  . Melatonin 5 MG TABS Take 5 mg by mouth as needed (sleep).   No facility-administered encounter medications on file as of 09/19/2019.    Follow-up: Return in about 6 months (around 03/18/2020), or labs and follow up.   Annie Main, FNP

## 2019-09-19 NOTE — Patient Instructions (Addendum)
Your lab results will be called to you for abnormal.  Follow up in 6 months with labs one week prior to appointment-Fasting from midnight the night before. Consider the pneumonia and shingles vaccine Call to schedule you mammogram and GYN examination as they are overdue We will send a referral to GYN, GI for colonoscopy today.  Please follow up with me as needed.  You had the Prevnar 13 vaccine today.  Over the next few visits I would like for you to have Shingles vaccine, Pneumococcal 23 vaccine, and Tetanus vaccine.  For COVID prevention, wear a mask when exposed to others that do not live in your home.   Nice to meet you!

## 2019-09-20 ENCOUNTER — Encounter (INDEPENDENT_AMBULATORY_CARE_PROVIDER_SITE_OTHER): Payer: Self-pay | Admitting: *Deleted

## 2019-09-20 LAB — CBC WITH DIFFERENTIAL/PLATELET
Absolute Monocytes: 354 cells/uL (ref 200–950)
Basophils Absolute: 89 cells/uL (ref 0–200)
Basophils Relative: 1.5 %
Eosinophils Absolute: 218 cells/uL (ref 15–500)
Eosinophils Relative: 3.7 %
HCT: 38 % (ref 35.0–45.0)
Hemoglobin: 12.6 g/dL (ref 11.7–15.5)
Lymphs Abs: 2277 cells/uL (ref 850–3900)
MCH: 31.2 pg (ref 27.0–33.0)
MCHC: 33.2 g/dL (ref 32.0–36.0)
MCV: 94.1 fL (ref 80.0–100.0)
MPV: 10.3 fL (ref 7.5–12.5)
Monocytes Relative: 6 %
Neutro Abs: 2962 cells/uL (ref 1500–7800)
Neutrophils Relative %: 50.2 %
Platelets: 283 10*3/uL (ref 140–400)
RBC: 4.04 10*6/uL (ref 3.80–5.10)
RDW: 12.1 % (ref 11.0–15.0)
Total Lymphocyte: 38.6 %
WBC: 5.9 10*3/uL (ref 3.8–10.8)

## 2019-09-20 LAB — COMPLETE METABOLIC PANEL WITH GFR
AG Ratio: 1.7 (calc) (ref 1.0–2.5)
ALT: 10 U/L (ref 6–29)
AST: 16 U/L (ref 10–35)
Albumin: 4.3 g/dL (ref 3.6–5.1)
Alkaline phosphatase (APISO): 72 U/L (ref 37–153)
BUN: 11 mg/dL (ref 7–25)
CO2: 26 mmol/L (ref 20–32)
Calcium: 9.6 mg/dL (ref 8.6–10.4)
Chloride: 103 mmol/L (ref 98–110)
Creat: 0.93 mg/dL (ref 0.50–0.99)
GFR, Est African American: 74 mL/min/{1.73_m2} (ref >=60)
GFR, Est Non African American: 64 mL/min/{1.73_m2} (ref >=60)
Globulin: 2.5 g/dL (calc) (ref 1.9–3.7)
Glucose, Bld: 88 mg/dL (ref 65–99)
Potassium: 4.3 mmol/L (ref 3.5–5.3)
Sodium: 139 mmol/L (ref 135–146)
Total Bilirubin: 0.3 mg/dL (ref 0.2–1.2)
Total Protein: 6.8 g/dL (ref 6.1–8.1)

## 2019-09-20 LAB — HEMOGLOBIN A1C
Hgb A1c MFr Bld: 5 % of total Hgb (ref <5.7)
Mean Plasma Glucose: 97 (calc)
eAG (mmol/L): 5.4 (calc)

## 2019-09-20 LAB — LIPID PANEL
Cholesterol: 260 mg/dL — ABNORMAL HIGH (ref <200)
HDL: 54 mg/dL (ref >=50)
LDL Cholesterol (Calc): 168 mg/dL (calc) — ABNORMAL HIGH
Non-HDL Cholesterol (Calc): 206 mg/dL (calc) — ABNORMAL HIGH (ref <130)
Total CHOL/HDL Ratio: 4.8 (calc) (ref <5.0)
Triglycerides: 227 mg/dL — ABNORMAL HIGH (ref <150)

## 2019-10-04 ENCOUNTER — Encounter: Payer: Self-pay | Admitting: Adult Health

## 2019-10-04 ENCOUNTER — Other Ambulatory Visit (HOSPITAL_COMMUNITY)
Admission: RE | Admit: 2019-10-04 | Discharge: 2019-10-04 | Disposition: A | Discharge status: Home / Self Care | Payer: Medicare HMO | Source: Ambulatory Visit | Attending: Adult Health | Admitting: Adult Health

## 2019-10-04 ENCOUNTER — Ambulatory Visit (INDEPENDENT_AMBULATORY_CARE_PROVIDER_SITE_OTHER): Payer: Medicare HMO | Admitting: Adult Health

## 2019-10-04 ENCOUNTER — Other Ambulatory Visit: Payer: Self-pay

## 2019-10-04 VITALS — BP 118/69 | HR 83 | Ht 64.0 in | Wt 135.6 lb

## 2019-10-04 DIAGNOSIS — Z780 Asymptomatic menopausal state: Secondary | ICD-10-CM | POA: Insufficient documentation

## 2019-10-04 DIAGNOSIS — Z1211 Encounter for screening for malignant neoplasm of colon: Secondary | ICD-10-CM | POA: Diagnosis not present

## 2019-10-04 DIAGNOSIS — Z124 Encounter for screening for malignant neoplasm of cervix: Secondary | ICD-10-CM | POA: Diagnosis not present

## 2019-10-04 DIAGNOSIS — Z1212 Encounter for screening for malignant neoplasm of rectum: Secondary | ICD-10-CM | POA: Diagnosis not present

## 2019-10-04 DIAGNOSIS — Z1151 Encounter for screening for human papillomavirus (HPV): Secondary | ICD-10-CM | POA: Diagnosis not present

## 2019-10-04 DIAGNOSIS — Z01419 Encounter for gynecological examination (general) (routine) without abnormal findings: Secondary | ICD-10-CM | POA: Diagnosis not present

## 2019-10-04 DIAGNOSIS — R69 Illness, unspecified: Secondary | ICD-10-CM | POA: Diagnosis not present

## 2019-10-04 LAB — HEMOCCULT GUIAC POC 1CARD (OFFICE): Fecal Occult Blood, POC: NEGATIVE

## 2019-10-04 NOTE — Progress Notes (Signed)
Patient ID: Sara Coffey, female   DOB: 1951-07-01, 69 y.o.   MRN: [id number] History of Present Illness: Sara Coffey is a 69 year old white female, married, PM in for a pap and pelvic exam.  PCP is Ishmael Holter NP.   Current Medications, Allergies, Past Medical History, Past Surgical History, Family History and Social History were reviewed in [employer] record.     Review of Systems: Patient denies any headaches, hearing loss, fatigue, blurred vision, shortness of breath, chest pain, abdominal pain, problems with bowel movements, urination, or intercourse. No joint pain or mood swings. +hot flashes  No vaginal bleeding since menopause     Physical Exam:BP 118/69 (BP Location: Left Arm, Patient Position: Sitting, Cuff Size: Normal)   Pulse 83   Ht 5\' 4"  (1.626 m)   Wt 135 lb 9.6 oz (61.5 kg)   BMI 23.28 kg/m  General:  Well developed, well nourished, no acute distress Skin:  Warm and dry Neck:  Midline trachea, normal thyroid, good ROM, no lymphadenopathy,no carotid bruits heard Lungs; Clear to auscultation bilaterally Cardiovascular: Regular rate and rhythm Pelvic:  External genitalia is normal in appearance, no lesions.  The vagina is pale with loss of moisture and rugae. Marland Kitchen Urethra has no lesions or masses. The cervix is bulbous and smooth.Pap with high risk HPV 16/18 genotyping performed.  Uterus is felt to be normal size, shape, and contour.  No adnexal masses or tenderness noted.Bladder is non tender, no masses felt. Rectal: Good sphincter tone, no polyps, +hemorrhoids felt.  Hemoccult negative. Psych:  No mood changes, alert and cooperative,seems happy Fall risk is low PHQ 2 score is 0.  Impression and Plan: 1. Routine cervical smear Pap sent  2. Encounter for gynecological examination with Papanicolaou smear of cervix Pap sent  Pap in 3 years if normal  Physical with PCP Mammogram in near future  DEXA in near future Labs with PCP   3. Screening  for colorectal cancer Colonoscopy in near future

## 2019-10-06 LAB — CYTOLOGY - PAP
Comment: NEGATIVE
Diagnosis: NEGATIVE
High risk HPV: NEGATIVE

## 2019-10-12 ENCOUNTER — Encounter: Payer: Self-pay | Admitting: Nurse Practitioner

## 2019-10-12 ENCOUNTER — Ambulatory Visit (HOSPITAL_COMMUNITY)
Admission: RE | Admit: 2019-10-12 | Discharge: 2019-10-12 | Disposition: A | Discharge status: Home / Self Care | Payer: Medicare HMO | Source: Ambulatory Visit | Attending: Nurse Practitioner | Admitting: Nurse Practitioner

## 2019-10-12 ENCOUNTER — Other Ambulatory Visit: Payer: Self-pay

## 2019-10-12 ENCOUNTER — Other Ambulatory Visit: Payer: Self-pay | Admitting: Nurse Practitioner

## 2019-10-12 DIAGNOSIS — Z1231 Encounter for screening mammogram for malignant neoplasm of breast: Secondary | ICD-10-CM | POA: Diagnosis not present

## 2019-10-12 DIAGNOSIS — R937 Abnormal findings on diagnostic imaging of other parts of musculoskeletal system: Secondary | ICD-10-CM | POA: Diagnosis not present

## 2019-10-12 DIAGNOSIS — M810 Age-related osteoporosis without current pathological fracture: Secondary | ICD-10-CM | POA: Insufficient documentation

## 2019-10-12 DIAGNOSIS — Z780 Asymptomatic menopausal state: Secondary | ICD-10-CM | POA: Diagnosis not present

## 2019-10-12 MED ORDER — RISEDRONATE SODIUM 5 MG PO TABS: 5.0000 mg | ORAL_TABLET | Freq: Every day | ORAL | 11 refills | Status: DC

## 2019-10-12 NOTE — Progress Notes (Signed)
Let pt know that her DEXA scan shows osteoporosis.  Please advise the pt: Lifestyle measures include adequate calcium and vitamin D, exercise, smoking cessation, counseling on fall prevention, and avoidance of heavy alcohol use. Offer printed material.   I would like her to take over the counter Os-Cal (calcium and Vitamin D3 dose 500-200mg  one tablet twice a day with food.  We will repeat DEXA every two years.

## 2019-10-12 NOTE — Progress Notes (Signed)
Mammogram results: There are no findings suspicious for malignancy.

## 2019-12-13 DIAGNOSIS — L570 Actinic keratosis: Secondary | ICD-10-CM | POA: Diagnosis not present

## 2019-12-13 DIAGNOSIS — X32XXXA Exposure to sunlight, initial encounter: Secondary | ICD-10-CM | POA: Diagnosis not present

## 2019-12-13 DIAGNOSIS — Z85828 Personal history of other malignant neoplasm of skin: Secondary | ICD-10-CM | POA: Diagnosis not present

## 2019-12-13 DIAGNOSIS — D225 Melanocytic nevi of trunk: Secondary | ICD-10-CM | POA: Diagnosis not present

## 2019-12-13 DIAGNOSIS — Z1283 Encounter for screening for malignant neoplasm of skin: Secondary | ICD-10-CM | POA: Diagnosis not present

## 2019-12-13 DIAGNOSIS — Z08 Encounter for follow-up examination after completed treatment for malignant neoplasm: Secondary | ICD-10-CM | POA: Diagnosis not present

## 2020-01-12 ENCOUNTER — Other Ambulatory Visit: Payer: Self-pay

## 2020-01-12 ENCOUNTER — Ambulatory Visit (INDEPENDENT_AMBULATORY_CARE_PROVIDER_SITE_OTHER): Payer: Medicare HMO | Admitting: Nurse Practitioner

## 2020-01-12 VITALS — BP 130/78 | HR 76 | Temp 99.5°F | Resp 18 | Wt 133.4 lb

## 2020-01-12 DIAGNOSIS — N390 Urinary tract infection, site not specified: Secondary | ICD-10-CM

## 2020-01-12 DIAGNOSIS — R319 Hematuria, unspecified: Secondary | ICD-10-CM | POA: Diagnosis not present

## 2020-01-12 DIAGNOSIS — R399 Unspecified symptoms and signs involving the genitourinary system: Secondary | ICD-10-CM

## 2020-01-12 LAB — URINALYSIS, ROUTINE W REFLEX MICROSCOPIC
Bilirubin Urine: NEGATIVE
Glucose, UA: NEGATIVE
Hyaline Cast: NONE SEEN /LPF
Ketones, ur: NEGATIVE
Nitrite: NEGATIVE
Protein, ur: NEGATIVE
Specific Gravity, Urine: 1.015 (ref 1.001–1.03)
pH: 7.5 (ref 5.0–8.0)

## 2020-01-12 LAB — MICROSCOPIC MESSAGE

## 2020-01-12 MED ORDER — SULFAMETHOXAZOLE-TRIMETHOPRIM 800-160 MG PO TABS: 1.0000 | ORAL_TABLET | Freq: Two times a day (BID) | ORAL | 0 refills | Status: DC

## 2020-01-12 NOTE — Patient Instructions (Signed)
Drink plenty of water Take medication as prescribed Follow up for non resolving or worsening symptoms

## 2020-01-12 NOTE — Progress Notes (Signed)
Established Patient Office Visit  Subjective:  Patient ID: Sara Coffey, female    DOB: May 21, 1951  Age: 69 y.o. MRN: [id number]  CC:  Chief Complaint  Patient presents with  . Urinary Tract Infection    pressure, pain, burn, started 3 days, azo was taken    HPI Sara Coffey is a 69 year old female presenting with symptoms of pelvic pressure and pain and burning when she voids that started 3 days ago.  She started taking Azo and drinking cranberry juice her symptoms did decrease but have not resolved.  He has not used antibiotics in the past 90 days.  She denies fever or chills or visualizing hematuria.   Past Medical History:  Diagnosis Date  . Amnesia 04/17/2019  . Hemorrhoid 09/28/2013  . Hyperlipidemia   . Osteoporosis   . Syncope and collapse   . TGA (transient global amnesia) 04/18/2019  . TIA (transient ischemic attack) 04/17/2019  . Tobacco abuse     Past Surgical History:  Procedure Laterality Date  . KNEE SX    . left eye surgery     . TUBAL LIGATION      Family History  Problem Relation Age of Onset  . Heart disease Father   . Hypertension Father   . Diabetes Father   . Hyperlipidemia Brother   . Breast cancer Neg Hx     Social History   Socioeconomic History  . Marital status: Married    Spouse name: Not on file  . Number of children: Not on file  . Years of education: Not on file  . Highest education level: Not on file  Occupational History  . Not on file  Tobacco Use  . Smoking status: Current Every Day Smoker    Packs/day: 1.00    Years: 40.00    Pack years: 40.00    Types: Cigarettes  . Smokeless tobacco: Never Used  Substance and Sexual Activity  . Alcohol use: No  . Drug use: No  . Sexual activity: Not Currently    Birth control/protection: Surgical, Post-menopausal    Comment: tubal  Other Topics Concern  . Not on file  Social History Narrative  . Not on file   Social Determinants of Health   Financial Resource Strain:    . Difficulty of Paying Living Expenses:   Food Insecurity:   . Worried About Charity fundraiser in the Last Year:   . Arboriculturist in the Last Year:   Transportation Needs:   . Film/video editor (Medical):   Marland Kitchen Lack of Transportation (Non-Medical):   Physical Activity:   . Days of Exercise per Week:   . Minutes of Exercise per Session:   Stress:   . Feeling of Stress :   Social Connections:   . Frequency of Communication with Friends and Family:   . Frequency of Social Gatherings with Friends and Family:   . Attends Religious Services:   . Active Member of Clubs or Organizations:   . Attends Archivist Meetings:   Marland Kitchen Marital Status:   Intimate Partner Violence:   . Fear of Current or Ex-Partner:   . Emotionally Abused:   Marland Kitchen Physically Abused:   . Sexually Abused:     Outpatient Medications Prior to Visit  Medication Sig Dispense Refill  . acetaminophen (TYLENOL) 500 MG tablet Take 500 mg by mouth every 6 (six) hours as needed for headache.    . calcium carbonate (TUMS - DOSED IN  MG ELEMENTAL CALCIUM) 500 MG chewable tablet Chew 1 tablet by mouth as needed for indigestion or heartburn.    . Melatonin 5 MG TABS Take 5 mg by mouth as needed (sleep).    . risedronate (ACTONEL) 5 MG tablet Take 1 tablet (5 mg total) by mouth daily before breakfast. with water on empty stomach, nothing by mouth or lie down for next 30 minutes. 30 tablet 11   No facility-administered medications prior to visit.    No Known Allergies  ROS Review of Systems  All other systems reviewed and are negative.     Objective:    Physical Exam  Constitutional: She is oriented to person, place, and time. She appears well-developed and well-nourished.  Non-toxic appearance. She does not appear ill.  HENT:  Head: Normocephalic.  Eyes: Pupils are equal, round, and reactive to light. Conjunctivae and EOM are normal.  Neck: No JVD present.  Cardiovascular: Normal rate.  Pulmonary/Chest:  Effort normal.  Abdominal: Soft. There is no CVA tenderness.  Musculoskeletal:     Cervical back: Normal range of motion.  Neurological: She is alert and oriented to person, place, and time.  Skin: Skin is warm and dry.  Psychiatric: She has a normal mood and affect. Her behavior is normal.  Nursing note and vitals reviewed.   BP 130/78 (BP Location: Left Arm, Patient Position: Sitting, Cuff Size: Normal)   Pulse 76   Temp 99.5 F (37.5 C) (Temporal)   Resp 18   Wt 133 lb 6.4 oz (60.5 kg)   SpO2 96%   BMI 22.90 kg/m  Wt Readings from Last 3 Encounters:  01/12/20 133 lb 6.4 oz (60.5 kg)  10/04/19 135 lb 9.6 oz (61.5 kg)  09/19/19 136 lb 6 oz (61.9 kg)     Health Maintenance Due  Topic Date Due  . COVID-19 Vaccine (1) Never done    There are no preventive care reminders to display for this patient.  Lab Results  Component Value Date   TSH 1.656 09/28/2013   Lab Results  Component Value Date   WBC 5.9 09/19/2019   HGB 12.6 09/19/2019   HCT 38.0 09/19/2019   MCV 94.1 09/19/2019   PLT 283 09/19/2019   Lab Results  Component Value Date   NA 139 09/19/2019   K 4.3 09/19/2019   CO2 26 09/19/2019   GLUCOSE 88 09/19/2019   BUN 11 09/19/2019   CREATININE 0.93 09/19/2019   BILITOT 0.3 09/19/2019   ALKPHOS 66 04/17/2019   AST 16 09/19/2019   ALT 10 09/19/2019   PROT 6.8 09/19/2019   ALBUMIN 4.2 04/17/2019   CALCIUM 9.6 09/19/2019   ANIONGAP 13 04/17/2019   Lab Results  Component Value Date   CHOL 260 (H) 09/19/2019   Lab Results  Component Value Date   HDL 54 09/19/2019   Lab Results  Component Value Date   LDLCALC 168 (H) 09/19/2019   Lab Results  Component Value Date   TRIG 227 (H) 09/19/2019   Lab Results  Component Value Date   CHOLHDL 4.8 09/19/2019   Lab Results  Component Value Date   HGBA1C 5.0 09/19/2019      Assessment & Plan:  Your urine specimen collected shows that you have a UTI. I have sent a antibiotic to your pharmacy.  I  have uploaded and printed education on UTI prevention and treatment Drink plenty of water Take medication as prescribed Follow up for non resolving or worsening symptoms I have updated our records  to reflect the COVID vaccination that you reported completing.   Problem List Items Addressed This Visit    None    Visit Diagnoses    UTI symptoms    -  Primary   Relevant Orders   Urinalysis, Routine w reflex microscopic   Urinary tract infection with hematuria, site unspecified       Relevant Medications   sulfamethoxazole-trimethoprim (BACTRIM DS) 800-160 MG tablet   Other Relevant Orders   Urine Culture      Meds ordered this encounter  Medications  . sulfamethoxazole-trimethoprim (BACTRIM DS) 800-160 MG tablet    Sig: Take 1 tablet by mouth 2 (two) times daily.    Dispense:  14 tablet    Refill:  0    Follow-up: Return if symptoms worsen or fail to improve.    Annie Main, FNP

## 2020-01-13 LAB — URINE CULTURE
MICRO NUMBER:: 10527248
Result:: NO GROWTH
SPECIMEN QUALITY:: ADEQUATE

## 2020-01-25 ENCOUNTER — Telehealth (INDEPENDENT_AMBULATORY_CARE_PROVIDER_SITE_OTHER): Payer: Self-pay | Admitting: *Deleted

## 2020-01-25 ENCOUNTER — Encounter (INDEPENDENT_AMBULATORY_CARE_PROVIDER_SITE_OTHER): Payer: Self-pay | Admitting: Gastroenterology

## 2020-01-25 ENCOUNTER — Telehealth (INDEPENDENT_AMBULATORY_CARE_PROVIDER_SITE_OTHER): Payer: Self-pay | Admitting: Gastroenterology

## 2020-01-25 ENCOUNTER — Ambulatory Visit (INDEPENDENT_AMBULATORY_CARE_PROVIDER_SITE_OTHER): Payer: Medicare HMO | Admitting: Gastroenterology

## 2020-01-25 ENCOUNTER — Encounter (INDEPENDENT_AMBULATORY_CARE_PROVIDER_SITE_OTHER): Payer: Self-pay | Admitting: *Deleted

## 2020-01-25 ENCOUNTER — Other Ambulatory Visit: Payer: Self-pay

## 2020-01-25 ENCOUNTER — Other Ambulatory Visit (INDEPENDENT_AMBULATORY_CARE_PROVIDER_SITE_OTHER): Payer: Self-pay | Admitting: *Deleted

## 2020-01-25 VITALS — BP 125/81 | HR 78 | Temp 97.1°F | Ht 63.0 in | Wt 133.4 lb

## 2020-01-25 DIAGNOSIS — Z86010 Personal history of colonic polyps: Secondary | ICD-10-CM | POA: Diagnosis not present

## 2020-01-25 DIAGNOSIS — K625 Hemorrhage of anus and rectum: Secondary | ICD-10-CM

## 2020-01-25 MED ORDER — PLENVU 140 G PO SOLR: 1.0000 | Freq: Once | ORAL | 0 refills | Status: AC

## 2020-01-25 NOTE — Patient Instructions (Signed)
We are scheduling you for a colonoscopy for evaluation.  Please contact us with any new symptoms

## 2020-01-25 NOTE — Telephone Encounter (Signed)
Patient left voice mail message stating she takes fish oil 1000 mg - 4 per day

## 2020-01-25 NOTE — Telephone Encounter (Signed)
Patient needs Plenvu (copay card) ° °

## 2020-01-25 NOTE — Telephone Encounter (Signed)
Noted. Thanks.

## 2020-01-25 NOTE — Progress Notes (Signed)
Patient profile: DELORAS Coffey is a 69 y.o. female seen for evaluation of rectal bleeding.   History of Present Illness: Sara Coffey is seen today for evaluation of history of colon polyps and rectal bleeding.  She reports a many year history of intermittent rectal bleeding, ongoing at least 7 years, bleeding occurs several times a week.  It is painless bleeding, she feels a hemorrhoid frequently protrude, this can protrude with either a bowel movement or with walking.  She denies any rectal pain.  She usually has 1 bowel movement a day that is soft and denies significant straining.  She has occasional GERD symptoms that resolved with Tums-overall these are infrequent.  She denies any nausea, vomiting epigastric pain, dysphagia.  Appetite good weight stable.  Wt Readings from Last 3 Encounters:  01/25/20 133 lb 6.4 oz (60.5 kg)  01/12/20 133 lb 6.4 oz (60.5 kg)  10/04/19 135 lb 9.6 oz (61.5 kg)     Last Colonoscopy: 2012-prominent internal hemorrhoids, pandiverticulosis with prominent changes in the sigmoid, multiple polyps removed including 2 and distal left colon into rectal polyps.  Pathology with sessile serrated adenoma in the hepatic flexure with low-grade adenomatous dysplasia  3 year repeat recommended     Last Endoscopy: 2012-multiple antral erosions, celiac biopsies negative.   Past Medical History:  Past Medical History:  Diagnosis Date  . Amnesia 04/17/2019  . Hemorrhoid 09/28/2013  . Hyperlipidemia   . Osteoporosis   . Syncope and collapse   . TGA (transient global amnesia) 04/18/2019  . TIA (transient ischemic attack) 04/17/2019  . Tobacco abuse     Problem List: Patient Active Problem List   Diagnosis Date Noted  . Osteoporosis   . Encounter for gynecological examination with Papanicolaou smear of cervix 10/04/2019  . Routine cervical smear 10/04/2019  . Screening for colorectal cancer 10/04/2019  . H/O: osteoarthritis 09/19/2019  . H/O squamous cell  carcinoma of skin 09/19/2019  . Heart murmur 04/17/2019  . Hyperlipidemia 11/09/2013  . IFG (impaired fasting glucose) 11/09/2013  . Tobacco abuse 09/28/2013    Past Surgical History: Past Surgical History:  Procedure Laterality Date  . KNEE SX    . left eye surgery     . TUBAL LIGATION      Allergies: No Known Allergies    Home Medications:  Current Outpatient Medications:  .  acetaminophen (TYLENOL) 500 MG tablet, Take 500 mg by mouth every 6 (six) hours as needed for headache., Disp: , Rfl:  .  calcium carbonate (TUMS - DOSED IN MG ELEMENTAL CALCIUM) 500 MG chewable tablet, Chew 1 tablet by mouth as needed for indigestion or heartburn., Disp: , Rfl:  .  CALCIUM CITRATE PO, Take by mouth daily., Disp: , Rfl:  .  Melatonin 5 MG TABS, Take 5 mg by mouth as needed (sleep)., Disp: , Rfl:  .  risedronate (ACTONEL) 5 MG tablet, Take 1 tablet (5 mg total) by mouth daily before breakfast. with water on empty stomach, nothing by mouth or lie down for next 30 minutes., Disp: 30 tablet, Rfl: 11 .  vitamin E (VITAMIN E) 1000 UNIT capsule, Take 1,000 Units by mouth daily. Patient takes 4 per day, Disp: , Rfl:    Family History: family history includes Diabetes in her father; Heart disease in her father; Hyperlipidemia in her brother; Hypertension in her father.    Social History:   reports that she has been smoking cigarettes. She has a 40.00 pack-year smoking history. She has never used  smokeless tobacco. She reports that she does not drink alcohol or use drugs.   Review of Systems: Constitutional: Denies weight loss/weight gain  Eyes: No changes in vision. ENT: No oral lesions, sore throat.  GI: see HPI.  Heme/Lymph: No easy bruising.  CV: No chest pain.  GU: No hematuria.  Integumentary: No rashes.  Neuro: No headaches.  Psych: No depression/anxiety.  Endocrine: No heat/cold intolerance.  Allergic/Immunologic: No urticaria.  Resp: No cough, SOB.  Musculoskeletal: No joint  swelling.    Physical Examination: BP 125/81 (BP Location: Right Arm, Patient Position: Sitting, Cuff Size: Large)   Pulse 78   Temp (!) 97.1 F (36.2 C) (Temporal)   Ht 5\' 3"  (1.6 m)   Wt 133 lb 6.4 oz (60.5 kg)   BMI 23.63 kg/m  Gen: NAD, alert and oriented x 4 HEENT: PEERLA, EOMI, Neck: supple, no JVD Chest: CTA bilaterally, no wheezes, crackles, or other adventitious sounds CV: RRR, no m/g/c/r Abd: soft, NT, ND, +BS in all four quadrants; no HSM, guarding, ridigity, or rebound tenderness Rectal-non tender DRE, no obvious external lesions. No gross blood on glove. Ext: no edema, well perfused with 2+ pulses, Skin: no rash or lesions noted on observed skin Lymph: no noted LAD  Data  Negative fecal occult blood 09/2019   09/2019 labs reviewed-unremarkable CMP, CBC   Assessment/Plan: Ms. Broberg is a 69 y.o. female  Lealer was seen today for new patient (initial visit).  Diagnoses and all orders for this visit:  Personal history of colonic polyps  Rectal bleeding     1.  History of colon polyps-sessile serrated adenoma in 2012, overdue for surveillance colonoscopy which will be scheduled today. She denies any issues previously with sedation.  2.  Rectal bleeding-chronic, likely from internal hemorrhoids, can assess further at time of colonoscopy. She reports regular BM without straining or rectal pain.   No UGI symptoms currently   Prior colonoscopy records to be scanned to chart  Patient denies CP, SOB, and use of blood thinners. I discussed the risks and benefits of procedure including bleeding, perforation, infection, missed lesions, medication reactions and possible hospitalization or surgery if complications. All questions answered.   I personally performed the service, non-incident to. (WP)  Laurine Blazer, [facility]

## 2020-02-29 ENCOUNTER — Encounter (HOSPITAL_COMMUNITY)
Admission: RE | Admit: 2020-02-29 | Discharge: 2020-02-29 | Disposition: A | Discharge status: Home / Self Care | Payer: Medicare HMO | Source: Ambulatory Visit | Attending: Internal Medicine | Admitting: Internal Medicine

## 2020-02-29 ENCOUNTER — Other Ambulatory Visit (HOSPITAL_COMMUNITY)
Admission: RE | Admit: 2020-02-29 | Discharge: 2020-02-29 | Disposition: A | Discharge status: Home / Self Care | Payer: Medicare HMO | Source: Ambulatory Visit | Attending: Internal Medicine | Admitting: Internal Medicine

## 2020-02-29 ENCOUNTER — Encounter (HOSPITAL_COMMUNITY): Payer: Self-pay

## 2020-02-29 ENCOUNTER — Other Ambulatory Visit: Payer: Self-pay

## 2020-02-29 DIAGNOSIS — Z01812 Encounter for preprocedural laboratory examination: Secondary | ICD-10-CM | POA: Insufficient documentation

## 2020-02-29 DIAGNOSIS — Z20822 Contact with and (suspected) exposure to covid-19: Secondary | ICD-10-CM | POA: Diagnosis not present

## 2020-02-29 LAB — SARS CORONAVIRUS 2 (TAT 6-24 HRS): SARS Coronavirus 2: NEGATIVE

## 2020-03-02 ENCOUNTER — Other Ambulatory Visit: Payer: Self-pay

## 2020-03-02 ENCOUNTER — Encounter (HOSPITAL_COMMUNITY): Payer: Self-pay | Admitting: Internal Medicine

## 2020-03-02 ENCOUNTER — Ambulatory Visit (HOSPITAL_COMMUNITY): Payer: Medicare HMO | Admitting: Certified Registered"

## 2020-03-02 ENCOUNTER — Ambulatory Visit (HOSPITAL_COMMUNITY)
Admission: RE | Admit: 2020-03-02 | Discharge: 2020-03-02 | Disposition: A | Discharge status: Home / Self Care | Payer: Medicare HMO | Attending: Internal Medicine | Admitting: Internal Medicine

## 2020-03-02 ENCOUNTER — Encounter (HOSPITAL_COMMUNITY)
Admission: RE | Disposition: A | Discharge status: Home / Self Care | Payer: Self-pay | Source: Home / Self Care | Attending: Internal Medicine

## 2020-03-02 DIAGNOSIS — Z86010 Personal history of colonic polyps: Secondary | ICD-10-CM | POA: Insufficient documentation

## 2020-03-02 DIAGNOSIS — K5730 Diverticulosis of large intestine without perforation or abscess without bleeding: Secondary | ICD-10-CM | POA: Insufficient documentation

## 2020-03-02 DIAGNOSIS — K648 Other hemorrhoids: Secondary | ICD-10-CM | POA: Diagnosis not present

## 2020-03-02 DIAGNOSIS — K621 Rectal polyp: Secondary | ICD-10-CM | POA: Diagnosis not present

## 2020-03-02 DIAGNOSIS — M810 Age-related osteoporosis without current pathological fracture: Secondary | ICD-10-CM | POA: Insufficient documentation

## 2020-03-02 DIAGNOSIS — Z8249 Family history of ischemic heart disease and other diseases of the circulatory system: Secondary | ICD-10-CM | POA: Diagnosis not present

## 2020-03-02 DIAGNOSIS — Z8673 Personal history of transient ischemic attack (TIA), and cerebral infarction without residual deficits: Secondary | ICD-10-CM | POA: Insufficient documentation

## 2020-03-02 DIAGNOSIS — Z7983 Long term (current) use of bisphosphonates: Secondary | ICD-10-CM | POA: Insufficient documentation

## 2020-03-02 DIAGNOSIS — D128 Benign neoplasm of rectum: Secondary | ICD-10-CM | POA: Diagnosis not present

## 2020-03-02 DIAGNOSIS — K644 Residual hemorrhoidal skin tags: Secondary | ICD-10-CM | POA: Diagnosis not present

## 2020-03-02 DIAGNOSIS — Z1211 Encounter for screening for malignant neoplasm of colon: Secondary | ICD-10-CM | POA: Diagnosis not present

## 2020-03-02 DIAGNOSIS — F17210 Nicotine dependence, cigarettes, uncomplicated: Secondary | ICD-10-CM | POA: Diagnosis not present

## 2020-03-02 DIAGNOSIS — Z79899 Other long term (current) drug therapy: Secondary | ICD-10-CM | POA: Diagnosis not present

## 2020-03-02 DIAGNOSIS — D123 Benign neoplasm of transverse colon: Secondary | ICD-10-CM | POA: Diagnosis not present

## 2020-03-02 DIAGNOSIS — D122 Benign neoplasm of ascending colon: Secondary | ICD-10-CM | POA: Diagnosis not present

## 2020-03-02 DIAGNOSIS — C182 Malignant neoplasm of ascending colon: Secondary | ICD-10-CM | POA: Diagnosis not present

## 2020-03-02 DIAGNOSIS — K635 Polyp of colon: Secondary | ICD-10-CM | POA: Diagnosis not present

## 2020-03-02 DIAGNOSIS — D490 Neoplasm of unspecified behavior of digestive system: Secondary | ICD-10-CM

## 2020-03-02 DIAGNOSIS — Z09 Encounter for follow-up examination after completed treatment for conditions other than malignant neoplasm: Secondary | ICD-10-CM | POA: Diagnosis not present

## 2020-03-02 HISTORY — PX: COLONOSCOPY WITH PROPOFOL: SHX5780

## 2020-03-02 HISTORY — PX: BIOPSY: SHX5522

## 2020-03-02 HISTORY — PX: POLYPECTOMY: SHX5525

## 2020-03-02 SURGERY — COLONOSCOPY WITH PROPOFOL
Anesthesia: General

## 2020-03-02 MED ORDER — GLYCOPYRROLATE PF 0.2 MG/ML IJ SOSY
PREFILLED_SYRINGE | INTRAMUSCULAR | Status: AC
  Filled 2020-03-02: qty 1

## 2020-03-02 MED ORDER — LACTATED RINGERS IV SOLN: INTRAVENOUS | Status: DC

## 2020-03-02 MED ORDER — GLYCOPYRROLATE 0.2 MG/ML IJ SOLN
INTRAMUSCULAR | Status: DC | PRN
  Administered 2020-03-02: .2 mg via INTRAVENOUS

## 2020-03-02 MED ORDER — LACTATED RINGERS IV SOLN: INTRAVENOUS | Status: DC | PRN

## 2020-03-02 MED ORDER — PHENYLEPHRINE 40 MCG/ML (10ML) SYRINGE FOR IV PUSH (FOR BLOOD PRESSURE SUPPORT)
PREFILLED_SYRINGE | INTRAVENOUS | Status: AC
  Filled 2020-03-02: qty 10

## 2020-03-02 MED ORDER — EPHEDRINE 5 MG/ML INJ
INTRAVENOUS | Status: AC
  Filled 2020-03-02: qty 10

## 2020-03-02 MED ORDER — PHENYLEPHRINE HCL (PRESSORS) 10 MG/ML IV SOLN
INTRAVENOUS | Status: DC | PRN
  Administered 2020-03-02 (×3): 100 ug via INTRAVENOUS

## 2020-03-02 MED ORDER — EPHEDRINE SULFATE 50 MG/ML IJ SOLN
INTRAMUSCULAR | Status: DC | PRN
  Administered 2020-03-02: 10 mg via INTRAVENOUS

## 2020-03-02 MED ORDER — SODIUM CHLORIDE 0.9 % IV SOLN: INTRAVENOUS | Status: DC

## 2020-03-02 MED ORDER — PROPOFOL 500 MG/50ML IV EMUL
INTRAVENOUS | Status: DC | PRN
  Administered 2020-03-02: 80 mg via INTRAVENOUS
  Administered 2020-03-02: 20 mg via INTRAVENOUS
  Administered 2020-03-02: 150 ug/kg/min via INTRAVENOUS
  Administered 2020-03-02: 20 mg via INTRAVENOUS

## 2020-03-02 NOTE — H&P (Signed)
Sara Coffey is an 69 y.o. female.   Chief Complaint: Patient is here for colonoscopy. HPI: Patient is 69 year old Caucasian female who has a history of sessile serrated polyp and is here for surveillance colonoscopy.  Last exam was in 2012.  She denies abdominal pain change in bowel habits or frank rectal bleeding.  She has hemorrhoids and bleeds from it from time to time.  She said at times it prolapses and she just pushes it back. Her appetite is good and her weight has been stable. Family history is negative for CRC.  Past Medical History:  Diagnosis Date  .    Marland Kitchen Hemorrhoid 09/28/2013  . Hyperlipidemia   . Osteoporosis   . Syncope and collapse   . TGA (transient global amnesia) 04/18/2019  . TIA (transient ischemic attack) 04/17/2019  . Tobacco abuse     Past Surgical History:  Procedure Laterality Date  . KNEE SX    . left eye surgery     . TUBAL LIGATION      Family History  Problem Relation Age of Onset  . Heart disease Father   . Hypertension Father   . Diabetes Father   . Hyperlipidemia Brother   . Breast cancer Neg Hx    Social History:  reports that she has been smoking cigarettes. She has a 40.00 pack-year smoking history. She has never used smokeless tobacco. She reports that she does not drink alcohol and does not use drugs.  Allergies: No Known Allergies  Medications Prior to Admission  Medication Sig Dispense Refill  . acetaminophen (TYLENOL) 500 MG tablet Take 500 mg by mouth every 6 (six) hours as needed for headache.    . Calcium Carb-Cholecalciferol (CALCIUM-VITAMIN D) 600-400 MG-UNIT TABS Take 1 tablet by mouth daily.    . calcium carbonate (TUMS - DOSED IN MG ELEMENTAL CALCIUM) 500 MG chewable tablet Chew 1 tablet by mouth as needed for indigestion or heartburn.    . Melatonin 5 MG TABS Take 5 mg by mouth as needed (sleep).    . Omega-3 Fatty Acids (FISH OIL) 1000 MG CAPS Take 2,000 mg by mouth in the morning and at bedtime.    . risedronate (ACTONEL)  5 MG tablet Take 1 tablet (5 mg total) by mouth daily before breakfast. with water on empty stomach, nothing by mouth or lie down for next 30 minutes. 30 tablet 11    Results for orders placed or performed during the hospital encounter of 02/29/20 (from the past 48 hour(s))  SARS CORONAVIRUS 2 (TAT 6-24 HRS) Nasopharyngeal Nasopharyngeal Swab     Status: None   Collection Time: 02/29/20 10:50 AM   Specimen: Nasopharyngeal Swab  Result Value Ref Range   SARS Coronavirus 2 NEGATIVE NEGATIVE    Comment: (NOTE) SARS-CoV-2 target nucleic acids are NOT DETECTED.  The SARS-CoV-2 RNA is generally detectable in upper and lower respiratory specimens during the acute phase of infection. Negative results do not preclude SARS-CoV-2 infection, do not rule out co-infections with other pathogens, and should not be used as the sole basis for treatment or other patient management decisions. Negative results must be combined with clinical observations, patient history, and epidemiological information. The expected result is Negative.  Fact Sheet for Patients: SugarRoll.be  Fact Sheet for Healthcare Providers: https://www.woods-mathews.com/  This test is not yet approved or cleared by the [place] FDA and  has been authorized for detection and/or diagnosis of SARS-CoV-2 by FDA under an Emergency Use Authorization (EUA). This EUA will remain  in effect (meaning this test can be used) for the duration of the COVID-19 declaration under Se ction 564(b)(1) of the Act, 21 U.S.C. section 360bbb-3(b)(1), unless the authorization is terminated or revoked sooner.  Performed at [facility], [place] [address] [place], [place] [address]    No results found.  Review of Systems  There were no vitals taken for this visit. Physical Exam Constitutional:      Comments: Well-developed thin Caucasian female in NAD.  HENT:     Mouth/Throat:     Mouth: Mucous  membranes are moist.     Pharynx: Oropharynx is clear.  Eyes:     General: No scleral icterus.    Conjunctiva/sclera: Conjunctivae normal.  Cardiovascular:     Rate and Rhythm: Normal rate and regular rhythm.     Heart sounds: Normal heart sounds. No murmur heard.   Pulmonary:     Effort: Pulmonary effort is normal.     Breath sounds: Normal breath sounds.  Abdominal:     General: Abdomen is flat. There is no distension.     Palpations: There is no mass.     Tenderness: There is no abdominal tenderness.  Musculoskeletal:        General: No swelling.     Cervical back: Neck supple.  Lymphadenopathy:     Cervical: No cervical adenopathy.  Skin:    General: Skin is warm and dry.      Assessment/Plan History of sessile serrated polyp. Surveillance colonoscopy.  Hildred Laser, MD 03/02/2020, 10:15 AM

## 2020-03-02 NOTE — Discharge Instructions (Signed)
No aspirin or NSAIDs for 5 days. Resume usual medications as before. High-fiber diet. No driving for 24 hours. Physician will call with biopsy results and further recommendations.   Colonoscopy, Adult, Care After This sheet gives you information about how to care for yourself after your procedure. Your doctor may also give you more specific instructions. If you have problems or questions, call your doctor. What can I expect after the procedure? After the procedure, it is common to have:  A small amount of blood in your poop (stool) for 24 hours.  Some gas.  Mild cramping or bloating in your belly (abdomen). Follow these instructions at home: Eating and drinking   Drink enough fluid to keep your pee (urine) pale yellow.  Follow instructions from your doctor about what you cannot eat or drink.  Return to your normal diet as told by your doctor. Avoid heavy or fried foods that are hard to digest. Activity  Rest as told by your doctor.  Do not sit for a long time without moving. Get up to take short walks every 1-2 hours. This is important. Ask for help if you feel weak or unsteady.  Return to your normal activities as told by your doctor. Ask your doctor what activities are safe for you. To help cramping and bloating:   Try walking around.  Put heat on your belly as told by your doctor. Use the heat source that your doctor recommends, such as a moist heat pack or a heating pad. ? Put a towel between your skin and the heat source. ? Leave the heat on for 20-30 minutes. ? Remove the heat if your skin turns bright red. This is very important if you are unable to feel pain, heat, or cold. You may have a greater risk of getting burned. General instructions  For the first 24 hours after the procedure: ? Do not drive or use machinery. ? Do not sign important documents. ? Do not drink alcohol. ? Do your daily activities more slowly than normal. ? Eat foods that are soft and easy  to digest.  Take over-the-counter or prescription medicines only as told by your doctor.  Keep all follow-up visits as told by your doctor. This is important. Contact a doctor if:  You have blood in your poop 2-3 days after the procedure. Get help right away if:  You have more than a small amount of blood in your poop.  You see large clumps of tissue (blood clots) in your poop.  Your belly is swollen.  You feel like you may vomit (nauseous).  You vomit.  You have a fever.  You have belly pain that gets worse, and medicine does not help your pain. Summary  After the procedure, it is common to have a small amount of blood in your poop. You may also have mild cramping and bloating in your belly.  For the first 24 hours after the procedure, do not drive or use machinery, do not sign important documents, and do not drink alcohol.  Get help right away if you have a lot of blood in your poop, feel like you may vomit, have a fever, or have more belly pain. This information is not intended to replace advice given to you by your health care provider. Make sure you discuss any questions you have with your health care provider. Document Revised: 02/28/2019 Document Reviewed: 02/28/2019 Elsevier Patient Education  [place] After These instructions provide  you with information about caring for yourself after your procedure. Your health care provider may also give you more specific instructions. Your treatment has been planned according to current medical practices, but problems sometimes occur. Call your health care provider if you have any problems or questions after your procedure. What can I expect after the procedure? After your procedure, you may:  Feel sleepy for several hours.  Feel clumsy and have poor balance for several hours.  Feel forgetful about what happened after the procedure.  Have poor judgment for several hours.  Feel  nauseous or vomit.  Have a sore throat if you had a breathing tube during the procedure. Follow these instructions at home: For at least 24 hours after the procedure:      Have a responsible adult stay with you. It is important to have someone help care for you until you are awake and alert.  Rest as needed.  Do not: ? Participate in activities in which you could fall or become injured. ? Drive. ? Use heavy machinery. ? Drink alcohol. ? Take sleeping pills or medicines that cause drowsiness. ? Make important decisions or sign legal documents. ? Take care of children on your own. Eating and drinking  Follow the diet that is recommended by your health care provider.  If you vomit, drink water, juice, or soup when you can drink without vomiting.  Make sure you have little or no nausea before eating solid foods. General instructions  Take over-the-counter and prescription medicines only as told by your health care provider.  If you have sleep apnea, surgery and certain medicines can increase your risk for breathing problems. Follow instructions from your health care provider about wearing your sleep device: ? Anytime you are sleeping, including during daytime naps. ? While taking prescription pain medicines, sleeping medicines, or medicines that make you drowsy.  If you smoke, do not smoke without supervision.  Keep all follow-up visits as told by your health care provider. This is important. Contact a health care provider if:  You keep feeling nauseous or you keep vomiting.  You feel light-headed.  You develop a rash.  You have a fever. Get help right away if:  You have trouble breathing. Summary  For several hours after your procedure, you may feel sleepy and have poor judgment.  Have a responsible adult stay with you for at least 24 hours or until you are awake and alert. This information is not intended to replace advice given to you by your health care  provider. Make sure you discuss any questions you have with your health care provider. Document Revised: 11/02/2017 Document Reviewed: 11/25/2015 Elsevier Patient Education  [place].

## 2020-03-02 NOTE — Transfer of Care (Signed)
Immediate Anesthesia Transfer of Care Note  Patient: Sara Coffey  Procedure(s) Performed: COLONOSCOPY WITH PROPOFOL (N/A ) POLYPECTOMY BIOPSY  Patient Location: PACU  Anesthesia Type:General  Level of Consciousness: awake, alert , oriented and patient cooperative  Airway & Oxygen Therapy: Patient Spontanous Breathing and Patient connected to nasal cannula oxygen  Post-op Assessment: Report given to RN and Post -op Vital signs reviewed and stable  Post vital signs: Reviewed and stable  Last Vitals:  Vitals Value Taken Time  BP    Temp    Pulse    Resp    SpO2      Last Pain:  Vitals:   03/02/20 1017  PainSc: 0-No pain         Complications: No complications documented.

## 2020-03-02 NOTE — Op Note (Signed)
[facility] Patient Name: Sara Coffey Procedure Date: 03/02/2020 9:48 AM MRN: [id number] Date of Birth: 1951/03/14 Attending MD: Hildred Laser , MD CSN: [id number] Age: 69 Admit Type: Outpatient Procedure:                Colonoscopy Indications:              High risk colon cancer surveillance: Personal                            history of colonic polyps Providers:                Hildred Laser, MD, Tammy Vaught, RN, Crystal Page,                            Randa Spike, Technician Referring MD:             Matthias Hughs. Redmond Baseman, FNP Medicines:                Propofol per Anesthesia Complications:            No immediate complications. Estimated Blood Loss:     Estimated blood loss was minimal. Procedure:                Pre-Anesthesia Assessment:                           - Prior to the procedure, a History and Physical                            was performed, and patient medications and                            allergies were reviewed. The patient's tolerance of                            previous anesthesia was also reviewed. The risks                            and benefits of the procedure and the sedation                            options and risks were discussed with the patient.                            All questions were answered, and informed consent                            was obtained. Prior Anticoagulants: The patient has                            taken no previous anticoagulant or antiplatelet                            agents. ASA Grade Assessment: II - A patient with  mild systemic disease. After reviewing the risks                            and benefits, the patient was deemed in                            satisfactory condition to undergo the procedure.                           After obtaining informed consent, the colonoscope                            was passed under direct vision. Throughout the                             procedure, the patient's blood pressure, pulse, and                            oxygen saturations were monitored continuously. The                            [id number]-H190DL ([id number]) scope was introduced through                            the anus and advanced to the the cecum, identified                            by appendiceal orifice and ileocecal valve. The                            colonoscopy was performed without difficulty. The                            patient tolerated the procedure well. The quality                            of the bowel preparation was good. The ileocecal                            valve, appendiceal orifice, and rectum were                            photographed. Scope In: 10:22:41 AM Scope Out: 10:51:12 AM Scope Withdrawal Time: 0 hours 13 minutes 29 seconds  Total Procedure Duration: 0 hours 28 minutes 31 seconds  Findings:      Skin tags were found on perianal exam.      An infiltrative, polypoid and ulcerated non-obstructing mass was found       in the proximal ascending colon. The mass was non-circumferential. The       mass measured two cm in length. In addition, its diameter measured       three mm. [Bleeding]. Biopsies were taken with a cold forceps for       histology. The pathology specimen was placed into Bottle Number 3.  Two multi-lobulated and sessile polyps were found in the hepatic flexure       and proximal ascending colon. The polyps were 10 to 15 mm in size. not       removed      A small polyp was found in the hepatic flexure. The polyp was sessile.       The polyp was removed with a cold snare. Resection and retrieval were       complete. The pathology specimen was placed into Bottle Number 1.      A small polyp was found in the proximal transverse colon. The polyp was       sessile. The polyp was removed with a cold snare. Resection and       retrieval were complete. The pathology specimen was placed into Bottle       Number  2.      Scattered diverticula were found in the sigmoid colon.      A 6 to 10 mm polyp was found in the rectum. The polyp was sessile. The       polyp was removed with a hot snare. Resection and retrieval were       complete. The pathology specimen was placed into Bottle Number 4.      External and internal hemorrhoids were found during retroflexion. The       hemorrhoids were medium-sized. Impression:               - Perianal skin tags found on perianal exam.                           - Tumor in the proximal ascending colon. Biopsied.                           - Two 10 to 15 mm polyps at the hepatic flexure and                            in the proximal ascending colon. These polyps were                            not removed.                           - One small polyp at the hepatic flexure, removed                            with a cold snare. Resected and retrieved.                           - One small polyp in the proximal transverse colon,                            removed with a cold snare. Resected and retrieved.                           - Diverticulosis in the sigmoid colon.                           - One 6 by  10 mm polyp in the rectum, removed with                            a hot snare. Resected and retrieved.                           - External and internal hemorrhoids. Moderate Sedation:      Per Anesthesia Care Recommendation:           - Patient has a contact number available for                            emergencies. The signs and symptoms of potential                            delayed complications were discussed with the                            patient. Return to normal activities tomorrow.                            Written discharge instructions were provided to the                            patient.                           - High fiber diet today.                           - Continue present medications.                           - No aspirin,  ibuprofen, naproxen, or other                            non-steroidal anti-inflammatory drugs for 5 days.                           - Await pathology results.                           - Use hydrocortisone suppository 25 mg 1 per rectum                            once a day for 2 weeks. Procedure Code(s):        --- Professional ---                           [phone], Colonoscopy, flexible; with removal of                            tumor(s), polyp(s), or other lesion(s) by snare                            technique  26948, 26, Colonoscopy, flexible; with biopsy,                            single or multiple Diagnosis Code(s):        --- Professional ---                           Z86.010, Personal history of colonic polyps                           D49.0, Neoplasm of unspecified behavior of                            digestive system                           K63.5, Polyp of colon                           K62.1, Rectal polyp                           K64.8, Other hemorrhoids                           K64.4, Residual hemorrhoidal skin tags                           K57.30, Diverticulosis of large intestine without                            perforation or abscess without bleeding CPT copyright 2019 American Medical Association. All rights reserved. The codes documented in this report are preliminary and upon coder review may  be revised to meet current compliance requirements. Hildred Laser, MD Hildred Laser, MD 03/02/2020 11:10:11 AM This report has been signed electronically. Number of Addenda: 0

## 2020-03-02 NOTE — Anesthesia Postprocedure Evaluation (Signed)
Anesthesia Post Note  Patient: Sara Coffey  Procedure(s) Performed: COLONOSCOPY WITH PROPOFOL (N/A ) POLYPECTOMY BIOPSY  Patient location during evaluation: PACU Anesthesia Type: General Level of consciousness: awake, oriented, awake and alert and patient cooperative Pain management: pain level controlled Respiratory status: spontaneous breathing, respiratory function stable, nonlabored ventilation and patient connected to nasal cannula oxygen Cardiovascular status: blood pressure returned to baseline and stable Postop Assessment: no headache and no backache Anesthetic complications: no   No complications documented.   Last Vitals: There were no vitals filed for this visit.  Last Pain:  Vitals:   03/02/20 1017  PainSc: 0-No pain                 Tacy Learn

## 2020-03-02 NOTE — Anesthesia Preprocedure Evaluation (Signed)
Anesthesia Evaluation  Patient identified by MRN, date of birth, ID band Patient awake    Reviewed: Allergy & Precautions, H&P , NPO status , Patient's Chart, lab work & pertinent test results, reviewed documented beta blocker date and time   Airway Mallampati: I  TM Distance: >3 FB Neck ROM: full    Dental no notable dental hx. (+) Teeth Intact   Pulmonary neg pulmonary ROS, Current Smoker,    Pulmonary exam normal breath sounds clear to auscultation       Cardiovascular Exercise Tolerance: Good negative cardio ROS   Rhythm:regular Rate:Normal     Neuro/Psych TIAnegative psych ROS   GI/Hepatic negative GI ROS, Neg liver ROS,   Endo/Other  negative endocrine ROS  Renal/GU negative Renal ROS  negative genitourinary   Musculoskeletal   Abdominal   Peds  Hematology negative hematology ROS (+)   Anesthesia Other Findings   Reproductive/Obstetrics negative OB ROS                             Anesthesia Physical Anesthesia Plan  ASA: II  Anesthesia Plan: General   Post-op Pain Management:    Induction:   PONV Risk Score and Plan: 3 and Propofol infusion  Airway Management Planned:   Additional Equipment:   Intra-op Plan:   Post-operative Plan:   Informed Consent: I have reviewed the patients History and Physical, chart, labs and discussed the procedure including the risks, benefits and alternatives for the proposed anesthesia with the patient or authorized representative who has indicated his/her understanding and acceptance.     Dental Advisory Given  Plan Discussed with: CRNA  Anesthesia Plan Comments:         Anesthesia Quick Evaluation

## 2020-03-06 ENCOUNTER — Other Ambulatory Visit: Payer: Self-pay | Admitting: Nurse Practitioner

## 2020-03-06 DIAGNOSIS — D128 Benign neoplasm of rectum: Secondary | ICD-10-CM

## 2020-03-06 DIAGNOSIS — C189 Malignant neoplasm of colon, unspecified: Secondary | ICD-10-CM

## 2020-03-06 LAB — SURGICAL PATHOLOGY

## 2020-03-06 NOTE — Progress Notes (Signed)
If pt does not know results from GI already please let her know, I have order CT of abdomen. Please assure to help get this scheduled in one week.

## 2020-03-07 ENCOUNTER — Telehealth (INDEPENDENT_AMBULATORY_CARE_PROVIDER_SITE_OTHER): Payer: Self-pay | Admitting: *Deleted

## 2020-03-07 ENCOUNTER — Other Ambulatory Visit (INDEPENDENT_AMBULATORY_CARE_PROVIDER_SITE_OTHER): Payer: Self-pay | Admitting: *Deleted

## 2020-03-07 ENCOUNTER — Encounter (HOSPITAL_COMMUNITY): Payer: Self-pay | Admitting: Internal Medicine

## 2020-03-07 DIAGNOSIS — C182 Malignant neoplasm of ascending colon: Secondary | ICD-10-CM

## 2020-03-07 NOTE — Telephone Encounter (Signed)
Patient called this morning and she states that at the time of her procedure she was told that a prescription for Hydrocortisone Suppository 25 mg - using one per rectum at bedtime for 2 weeks would be called in. She nor her pharmacy had heard anything. Reviewing Dr.Rehman's procedure note the patient was to get this RX. This was called to the patient's pharmacy. Ms. Tuch was made aware.

## 2020-03-12 ENCOUNTER — Other Ambulatory Visit: Payer: Self-pay

## 2020-03-12 ENCOUNTER — Other Ambulatory Visit: Payer: Medicare HMO

## 2020-03-12 DIAGNOSIS — E785 Hyperlipidemia, unspecified: Secondary | ICD-10-CM | POA: Diagnosis not present

## 2020-03-12 DIAGNOSIS — R7309 Other abnormal glucose: Secondary | ICD-10-CM

## 2020-03-13 LAB — LIPID PANEL
Cholesterol: 246 mg/dL — ABNORMAL HIGH (ref <200)
HDL: 51 mg/dL (ref >=50)
LDL Cholesterol (Calc): 166 mg/dL (calc) — ABNORMAL HIGH
Non-HDL Cholesterol (Calc): 195 mg/dL (calc) — ABNORMAL HIGH (ref <130)
Total CHOL/HDL Ratio: 4.8 (calc) (ref <5.0)
Triglycerides: 149 mg/dL (ref <150)

## 2020-03-13 LAB — HEMOGLOBIN A1C
Hgb A1c MFr Bld: 5 % of total Hgb (ref <5.7)
Mean Plasma Glucose: 97 (calc)
eAG (mmol/L): 5.4 (calc)

## 2020-03-14 ENCOUNTER — Telehealth (INDEPENDENT_AMBULATORY_CARE_PROVIDER_SITE_OTHER): Payer: Self-pay | Admitting: *Deleted

## 2020-03-14 ENCOUNTER — Other Ambulatory Visit: Payer: Self-pay

## 2020-03-14 ENCOUNTER — Ambulatory Visit (HOSPITAL_COMMUNITY)
Admission: RE | Admit: 2020-03-14 | Discharge: 2020-03-14 | Disposition: A | Discharge status: Home / Self Care | Payer: Medicare HMO | Source: Ambulatory Visit | Attending: Internal Medicine | Admitting: Internal Medicine

## 2020-03-14 DIAGNOSIS — C182 Malignant neoplasm of ascending colon: Secondary | ICD-10-CM | POA: Diagnosis present

## 2020-03-14 DIAGNOSIS — K5730 Diverticulosis of large intestine without perforation or abscess without bleeding: Secondary | ICD-10-CM | POA: Diagnosis not present

## 2020-03-14 DIAGNOSIS — C189 Malignant neoplasm of colon, unspecified: Secondary | ICD-10-CM | POA: Diagnosis not present

## 2020-03-14 DIAGNOSIS — K6389 Other specified diseases of intestine: Secondary | ICD-10-CM | POA: Diagnosis not present

## 2020-03-14 LAB — POCT I-STAT CREATININE: Creatinine, Ser: 0.7 mg/dL (ref 0.44–1.00)

## 2020-03-14 MED ORDER — IOHEXOL 300 MG/ML  SOLN
100.0000 mL | Freq: Once | INTRAMUSCULAR | Status: AC | PRN
  Administered 2020-03-14: 100 mL via INTRAVENOUS

## 2020-03-14 NOTE — Telephone Encounter (Signed)
Patient called in - hyrdocortisone is too expensive, wants to know if she can do something cheaper - please call

## 2020-03-14 NOTE — Telephone Encounter (Signed)
Dr.Rehman has been made aware. Once he makes another recommendation , patient will be called.

## 2020-03-19 ENCOUNTER — Other Ambulatory Visit: Payer: Self-pay

## 2020-03-19 ENCOUNTER — Ambulatory Visit (INDEPENDENT_AMBULATORY_CARE_PROVIDER_SITE_OTHER): Payer: Medicare HMO | Admitting: Nurse Practitioner

## 2020-03-19 VITALS — BP 120/82 | HR 74 | Temp 99.1°F | Resp 18 | Wt 131.6 lb

## 2020-03-19 DIAGNOSIS — E785 Hyperlipidemia, unspecified: Secondary | ICD-10-CM

## 2020-03-19 MED ORDER — ROSUVASTATIN CALCIUM 5 MG PO TABS: 5.0000 mg | ORAL_TABLET | Freq: Every day | ORAL | 6 refills | Status: DC

## 2020-03-19 NOTE — Progress Notes (Signed)
Established Patient Office Visit  Subjective:  Patient ID: Sara Coffey, female    DOB: 1950/11/11  Age: 69 y.o. MRN: [id number]  CC:  Chief Complaint  Patient presents with  . Osteoporosis    follow up    HPI Sara Coffey is a 69 year old female presenting for a 5 6 month follow up with labs review. She completed her labs prior to visit and reviewed with her today. Her cholesterol levels elevated. Discussed treatment plan and pt desires agrees as follows. She will include exercise 20 minutes at least 4 times per week with medication.   She recently completed GI consult with findings of Colon CA has started tx will follow.   No cp, ct, gu/gi sxs today, pain, sob, edema, palpitation, or recent falls, no depression/anxiety.   Past Medical History:  Diagnosis Date  . Amnesia 04/17/2019  . Hemorrhoid 09/28/2013  . Hyperlipidemia   . Osteoporosis   . Syncope and collapse   . TGA (transient global amnesia) 04/18/2019  . TIA (transient ischemic attack) 04/17/2019  . Tobacco abuse     Past Surgical History:  Procedure Laterality Date  . BIOPSY  03/02/2020   Procedure: BIOPSY;  Surgeon: Rogene Houston, MD;  [facility];  Service: Endoscopy;;  ascending colon mass  . COLONOSCOPY WITH PROPOFOL N/A 03/02/2020   Procedure: COLONOSCOPY WITH PROPOFOL;  Surgeon: Rogene Houston, MD;  Location: [facility];  Service: Endoscopy;  Laterality: N/A;  1055  . KNEE SX    . left eye surgery     . POLYPECTOMY  03/02/2020   Procedure: POLYPECTOMY;  Surgeon: Rogene Houston, MD;  Location: [facility];  Service: Endoscopy;;  . TUBAL LIGATION      Family History  Problem Relation Age of Onset  . Heart disease Father   . Hypertension Father   . Diabetes Father   . Hyperlipidemia Brother   . Breast cancer Neg Hx     Social History   Socioeconomic History  . Marital status: Married    Spouse name: Not on file  . Number of children: Not on file  . Years of education:  Not on file  . Highest education level: Not on file  Occupational History  . Not on file  Tobacco Use  . Smoking status: Current Every Day Smoker    Packs/day: 1.00    Years: 40.00    Pack years: 40.00    Types: Cigarettes  . Smokeless tobacco: Never Used  Vaping Use  . Vaping Use: Never used  Substance and Sexual Activity  . Alcohol use: No  . Drug use: No  . Sexual activity: Not Currently    Birth control/protection: Surgical, Post-menopausal    Comment: tubal  Other Topics Concern  . Not on file  Social History Narrative  . Not on file   Social Determinants of Health   Financial Resource Strain:   . Difficulty of Paying Living Expenses:   Food Insecurity:   . Worried About Charity fundraiser in the Last Year:   . Arboriculturist in the Last Year:   Transportation Needs:   . Film/video editor (Medical):   Marland Kitchen Lack of Transportation (Non-Medical):   Physical Activity:   . Days of Exercise per Week:   . Minutes of Exercise per Session:   Stress:   . Feeling of Stress :   Social Connections:   . Frequency of Communication with Friends and  Family:   . Frequency of Social Gatherings with Friends and Family:   . Attends Religious Services:   . Active Member of Clubs or Organizations:   . Attends Archivist Meetings:   Marland Kitchen Marital Status:   Intimate Partner Violence:   . Fear of Current or Ex-Partner:   . Emotionally Abused:   Marland Kitchen Physically Abused:   . Sexually Abused:     Outpatient Medications Prior to Visit  Medication Sig Dispense Refill  . acetaminophen (TYLENOL) 500 MG tablet Take 500 mg by mouth every 6 (six) hours as needed for headache.    . Calcium Carb-Cholecalciferol (CALCIUM-VITAMIN D) 600-400 MG-UNIT TABS Take 1 tablet by mouth daily.    . calcium carbonate (TUMS - DOSED IN MG ELEMENTAL CALCIUM) 500 MG chewable tablet Chew 1 tablet by mouth as needed for indigestion or heartburn.    . Melatonin 5 MG TABS Take 5 mg by mouth as needed  (sleep).    . Omega-3 Fatty Acids (FISH OIL) 1000 MG CAPS Take 2,000 mg by mouth in the morning and at bedtime.    . risedronate (ACTONEL) 5 MG tablet Take 1 tablet (5 mg total) by mouth daily before breakfast. with water on empty stomach, nothing by mouth or lie down for next 30 minutes. 30 tablet 11   No facility-administered medications prior to visit.    No Known Allergies  ROS Review of Systems  All other systems reviewed and are negative.     Objective:    Physical Exam Vitals and nursing note reviewed.  Constitutional:      Appearance: Normal appearance. She is well-developed and well-groomed.  HENT:     Head: Normocephalic.     Right Ear: Hearing normal.     Left Ear: Hearing normal.     Mouth/Throat:     Lips: Pink.  Eyes:     General: Lids are normal. Lids are everted, no foreign bodies appreciated.     Extraocular Movements: Extraocular movements intact.     Conjunctiva/sclera: Conjunctivae normal.     Pupils: Pupils are equal, round, and reactive to light.  Neck:     Thyroid: No thyromegaly.     Vascular: No JVD.  Cardiovascular:     Rate and Rhythm: Normal rate.  Pulmonary:     Effort: Pulmonary effort is normal.  Musculoskeletal:        General: Normal range of motion.     Cervical back: Full passive range of motion without pain, normal range of motion and neck supple.     Right lower leg: No edema.     Left lower leg: No edema.  Skin:    General: Skin is warm and dry.     Capillary Refill: Capillary refill takes less than 2 seconds.     Coloration: Skin is not jaundiced or pale.  Neurological:     General: No focal deficit present.     Mental Status: She is alert and oriented to person, place, and time.  Psychiatric:        Attention and Perception: Attention normal.        Mood and Affect: Mood normal.        Speech: Speech normal.        Behavior: Behavior normal. Behavior is cooperative.        Cognition and Memory: Cognition and memory  normal.        Judgment: Judgment normal.     BP 120/82 (BP Location: Left Arm, Patient  Position: Sitting, Cuff Size: Normal)   Pulse 74   Temp 99.1 F (37.3 C) (Temporal)   Resp 18   Wt 131 lb 9.6 oz (59.7 kg)   SpO2 97%   BMI 23.31 kg/m  Wt Readings from Last 3 Encounters:  03/22/20 131 lb (59.4 kg)  03/19/20 131 lb 9.6 oz (59.7 kg)  01/25/20 133 lb 6.4 oz (60.5 kg)     Health Maintenance Due  Topic Date Due  . Hepatitis C Screening  Never done  . TETANUS/TDAP  Never done  . INFLUENZA VACCINE  03/18/2020    There are no preventive care reminders to display for this patient.  Lab Results  Component Value Date   TSH 1.656 09/28/2013   Lab Results  Component Value Date   WBC 5.9 09/19/2019   HGB 12.6 09/19/2019   HCT 38.0 09/19/2019   MCV 94.1 09/19/2019   PLT 283 09/19/2019   Lab Results  Component Value Date   NA 139 09/19/2019   K 4.3 09/19/2019   CO2 26 09/19/2019   GLUCOSE 88 09/19/2019   BUN 11 09/19/2019   CREATININE 0.70 03/14/2020   BILITOT 0.3 09/19/2019   ALKPHOS 66 04/17/2019   AST 16 09/19/2019   ALT 10 09/19/2019   PROT 6.8 09/19/2019   ALBUMIN 4.2 04/17/2019   CALCIUM 9.6 09/19/2019   ANIONGAP 13 04/17/2019   Lab Results  Component Value Date   CHOL 246 (H) 03/12/2020   Lab Results  Component Value Date   HDL 51 03/12/2020   Lab Results  Component Value Date   LDLCALC 166 (H) 03/12/2020   Lab Results  Component Value Date   TRIG 149 03/12/2020   Lab Results  Component Value Date   CHOLHDL 4.8 03/12/2020   Lab Results  Component Value Date   HGBA1C 5.0 03/12/2020      Assessment & Plan:   Problem List Items Addressed This Visit      Other   Hyperlipidemia - Primary   Relevant Medications   rosuvastatin (CRESTOR) 5 MG tablet    Adding Crestor for elevated lipid results after review of lab results with pt today  Will plan on Hep C screening next lab if pt agrees, pt to consider Shingrex vaccination, New Dx of  Colon CA under current treatment will follow.   Pt to f/u in 6 months for her regular CPE with labs.   Get healthy exercise to stay healthy printout and upload to Mychart added.  Meds ordered this encounter  Medications  . rosuvastatin (CRESTOR) 5 MG tablet    Sig: Take 1 tablet (5 mg total) by mouth daily.    Dispense:  30 tablet    Refill:  6    Follow-up: Return in about 6 months (around 09/19/2020) for cpe with lab.    Annie Main, FNP

## 2020-03-20 NOTE — Telephone Encounter (Signed)
Dr.Rehman states that she can try the 2% or the 2.5 % Hydrocortisone. Patient to be made aware.

## 2020-03-21 NOTE — Telephone Encounter (Signed)
Dr.Rehman states that the patient may use the 1% hydrocortisone cream.  Patient called and made aware.

## 2020-03-22 ENCOUNTER — Ambulatory Visit: Payer: Medicare HMO | Admitting: General Surgery

## 2020-03-22 ENCOUNTER — Encounter: Payer: Self-pay | Admitting: General Surgery

## 2020-03-22 ENCOUNTER — Other Ambulatory Visit: Payer: Self-pay

## 2020-03-22 VITALS — BP 114/74 | HR 71 | Temp 98.6°F | Resp 16 | Ht 63.0 in | Wt 131.0 lb

## 2020-03-22 DIAGNOSIS — C182 Malignant neoplasm of ascending colon: Secondary | ICD-10-CM | POA: Insufficient documentation

## 2020-03-22 MED ORDER — METRONIDAZOLE 500 MG PO TABS: 1000.0000 mg | ORAL_TABLET | ORAL | 0 refills | Status: DC

## 2020-03-22 MED ORDER — DULCOLAX 5 MG PO TBEC
20.0000 mg | DELAYED_RELEASE_TABLET | Freq: Once | ORAL | 0 refills | Status: AC
Start: 2020-03-22 — End: 2020-03-22

## 2020-03-22 MED ORDER — NEOMYCIN SULFATE 500 MG PO TABS: 1000.0000 mg | ORAL_TABLET | ORAL | 0 refills | Status: DC

## 2020-03-22 NOTE — Patient Instructions (Addendum)
Adding Crestor for elevated lipid results after review of lab results with pt today  Will plan on Hep C screening next lab if pt agrees, pt to consider Shingrex vaccination, New Dx of Colon CA under current treatment will follow.   Pt to f/u in 6 months for her regular CPE with labs.   Get healthy exercise to stay healthy printout and upload to Mychart added.

## 2020-03-22 NOTE — Progress Notes (Signed)
[facility] History and Physical  Reason for Referral: Colon cancer  Referring Physician: Dr. Laural Golden   Chief Complaint    New Patient (Initial Visit)      Sara Coffey is a 69 y.o. female.  HPI: Sara Coffey is a 69 yo who has a history of recently diagnosed colon cancer and unresected polyps in the ascending colon and hepatic flexure. She had a normal screening colonoscopy and this was found. She had her prior colonoscopy in 2012 when she had a sessile serrated polyp.  She reports some issues with hemorrhoids and minor bleeding but major bleeding.   She denies any abdominal pain, nausea, vomiting, and has no family history of colon cancer.   She has this vague history last year of concern for TIA which she said was ultimately thought to be a transient global amnesia and not a stroke. She has never had a heart attack or other cardiovascular event. She does mud runs and is active and has no chest pain or SOB.   Past Medical History:  Diagnosis Date  . Amnesia 04/17/2019  . Hemorrhoid 09/28/2013  . Hyperlipidemia   . Osteoporosis   . Syncope and collapse   . TGA (transient global amnesia) 04/18/2019  . TIA (transient ischemic attack) 04/17/2019  . Tobacco abuse     Past Surgical History:  Procedure Laterality Date  . BIOPSY  03/02/2020   Procedure: BIOPSY;  Surgeon: Rogene Houston, MD;  [facility];  Service: Endoscopy;;  ascending colon mass  . COLONOSCOPY WITH PROPOFOL N/A 03/02/2020   Procedure: COLONOSCOPY WITH PROPOFOL;  Surgeon: Rogene Houston, MD;  Location: [facility];  Service: Endoscopy;  Laterality: N/A;  1055  . KNEE SX    . left eye surgery     . POLYPECTOMY  03/02/2020   Procedure: POLYPECTOMY;  Surgeon: Rogene Houston, MD;  [facility];  Service: Endoscopy;;  . TUBAL LIGATION      Family History  Problem Relation Age of Onset  . Heart disease Father   . Hypertension Father   . Diabetes Father   . Hyperlipidemia  Brother   . Breast cancer Neg Hx     Social History   Tobacco Use  . Smoking status: Current Every Day Smoker    Packs/day: 1.00    Years: 40.00    Pack years: 40.00    Types: Cigarettes  . Smokeless tobacco: Never Used  Vaping Use  . Vaping Use: Never used  Substance Use Topics  . Alcohol use: No  . Drug use: No    Medications: I have reviewed the patient's current medications. Allergies as of 03/22/2020   No Known Allergies     Medication List       Accurate as of March 22, 2020 11:59 PM. If you have any questions, ask your nurse or doctor.        acetaminophen 500 MG tablet Commonly known as: TYLENOL Take 500 mg by mouth every 6 (six) hours as needed for headache.   calcium carbonate 500 MG chewable tablet Commonly known as: TUMS - dosed in mg elemental calcium Chew 1 tablet by mouth as needed for indigestion or heartburn.   Calcium-Vitamin D 600-400 MG-UNIT Tabs Take 1 tablet by mouth daily.   Dulcolax 5 MG EC tablet Generic drug: bisacodyl Take 4 tablets (20 mg total) by mouth once for 1 dose. Take at 7am the day prior to surgery. Started by: Virl Cagey, MD  Fish Oil 1000 MG Caps Take 2,000 mg by mouth in the morning and at bedtime.   melatonin 5 MG Tabs Take 5 mg by mouth as needed (sleep).   metroNIDAZOLE 500 MG tablet Commonly known as: Flagyl Take 2 tablets (1,000 mg total) by mouth as directed. Started by: Virl Cagey, MD   neomycin 500 MG tablet Commonly known as: MYCIFRADIN Take 2 tablets (1,000 mg total) by mouth as directed. Take 2 neomycin 500mg  tablets at 2pm, 3pm and 10 pm. Started by: Virl Cagey, MD   rosuvastatin 5 MG tablet Commonly known as: Crestor Take 1 tablet (5 mg total) by mouth daily.        ROS:  A comprehensive review of systems was negative except for: Gastrointestinal: positive for minor hemorrhoids   Blood pressure 114/74, pulse 71, temperature 98.6 F (37 C), temperature source Oral, resp.  rate 16, height 5\' 3"  (1.6 m), weight 131 lb (59.4 kg), SpO2 96 %. Physical Exam Vitals reviewed.  Constitutional:      Appearance: She is normal weight.  HENT:     Head: Normocephalic and atraumatic.     Nose: Nose normal.     Mouth/Throat:     Mouth: Mucous membranes are moist.  Eyes:     Extraocular Movements: Extraocular movements intact.     Pupils: Pupils are equal, round, and reactive to light.  Cardiovascular:     Rate and Rhythm: Normal rate and regular rhythm.  Pulmonary:     Effort: Pulmonary effort is normal.     Breath sounds: Normal breath sounds.  Abdominal:     General: There is no distension.     Palpations: Abdomen is soft.     Tenderness: There is no abdominal tenderness.  Musculoskeletal:        General: No swelling. Normal range of motion.     Cervical back: Normal range of motion.  Skin:    General: Skin is warm and dry.  Neurological:     General: No focal deficit present.     Mental Status: She is alert and oriented to person, place, and time.  Psychiatric:        Mood and Affect: Mood normal.        Behavior: Behavior normal.        Thought Content: Thought content normal.        Judgment: Judgment normal.     Results: CT personally reviewed- mass in the ascending colon, no signs of metastatic disease  CLINICAL DATA:  Recently diagnosed colon carcinoma by colonoscopy. Staging.  EXAM: CT ABDOMEN AND PELVIS WITH CONTRAST  TECHNIQUE: Multidetector CT imaging of the abdomen and pelvis was performed using the standard protocol following bolus administration of intravenous contrast.  CONTRAST:  148mL OMNIPAQUE IOHEXOL 300 MG/ML  SOLN  COMPARISON:  None.  FINDINGS: Lower Chest: No acute findings.  Hepatobiliary: No hepatic masses identified. 1.5 cm cyst seen in the anterior left lobe. Gallbladder is unremarkable. No evidence of biliary ductal dilatation.  Pancreas:  No mass or inflammatory changes.  Spleen: Within normal limits  in size and appearance.  Adrenals/Urinary Tract: No masses identified. Several tiny renal cysts noted bilaterally. No evidence of ureteral calculi or hydronephrosis.  Stomach/Bowel: On intraluminal mass is seen in the right: Just distal to the ileocecal valve which measures 4.6 x 2.4 cm on image 40/5. This is consistent with the known primary colon carcinoma. No evidence of obstruction, inflammatory process or abnormal fluid collections. Diverticulosis is seen mainly  involving the sigmoid colon, however there is no evidence of diverticulitis.  Vascular/Lymphatic: No pathologically enlarged lymph nodes. No abdominal aortic aneurysm. Aortic atherosclerosis noted.  Reproductive:  No mass or other significant abnormality.  Other:  None.  Musculoskeletal:  No suspicious bone lesions identified.  IMPRESSION: 4.6 cm intraluminal mass in the right colon, consistent with known primary colon carcinoma.  No evidence of metastatic disease within the abdomen or pelvis.  Sigmoid diverticulosis, without radiographic evidence of diverticulitis.  Aortic Atherosclerosis (ICD10-I70.0).   Electronically Signed   By: Marlaine Hind M.D.   On: 03/14/2020 11:20  Assessment & Plan:  AVYA FLAVELL is a 69 y.o. female with a newly diagnosed colon cancer. She is otherwise healthy and active.   -Discussed laparoscopic extended right hemicolectomy to ensure hepatic flexure polyps removed -Discussed risk of bleeding, infection, anastomosis, anastomotic leak, injury to other organs and ureter and need for an open procedure.  -Discussed preop colon preparation and also hospital course.   Colon Preparation:  Buy from the Store: Miralax bottle [phone]).  Gatorade 64 oz (not red). Dulcolax tablets.   The Day Prior to Surgery: Take 4 ducolax tablets at 7am with water. Drink plenty of clear liquids all day to avoid dehydration, no solid food.    Mix the bottle of Miralax and 64 oz of  Gatorade and drink this mixture starting at 10am. Drink it gradually over the next few hours, 8 ounces every 15-30 minutes until it is gone. Finish this by 2pm.  Take 2 neomycin 500mg  tablets and 2 metronidazole 500mg  tablets at 2 pm. Take 2 neomycin 500mg  tablets and 2 metronidazole 500mg  tablets at 3pm. Take 2 neomycin 500mg  tablets and 2 metronidazole 500mg  tablets at 10pm.    Do not eat or drink anything after midnight the night before your surgery.  Do not eat or drink anything that morning, and take medications as instructed by the hospital staff on your preoperative visit.   Discussed COVID test preop.   All questions were answered to the satisfaction of the patient and family.   Virl Cagey 03/24/2020, 10:19 AM

## 2020-03-22 NOTE — Progress Notes (Signed)
Labs are better continue plan and follow up

## 2020-03-22 NOTE — Patient Instructions (Addendum)
Colon Preparation: Buy from the Store: Miralax bottle [phone]).  Gatorade 64 oz (not red). Dulcolax tablets.   The Day Prior to Surgery: Take 4 ducolax tablets at 7am with water. Drink plenty of clear liquids all day to avoid dehydration, no solid food the day prior to surgery.    Mix the bottle of Miralax and 64 oz of Gatorade and drink this mixture starting at 10am. Drink it gradually over the next few hours, 8 ounces every 15-30 minutes until it is gone. Finish this by 2pm.  Take 2 neomycin 500mg  tablets and 2 metronidazole 500mg  tablets at 2 pm. Take 2 neomycin 500mg  tablets and 2 metronidazole 500mg  tablets at 3pm. Take 2 neomycin 500mg  tablets and 2 metronidazole 500mg  tablets at 10pm.    Do not eat or drink anything that morning, and take medications as instructed by the hospital staff on your preoperative visit.    Laparoscopic Colectomy Laparoscopic colectomy is surgery to remove part or all of the large intestine (colon). This procedure may be used to treat several conditions, including:  Inflammation and infection of the colon (diverticulitis).  Tumors or masses in the colon.  Inflammatory bowel disease, such as Crohn disease or ulcerative colitis. Colectomy is an option when symptoms cannot be controlled with medicines.  Bleeding from the colon that cannot be controlled by another method.  Blockage or obstruction of the colon. Tell a health care provider about:  Any allergies you have.  All medicines you are taking, including vitamins, herbs, eye drops, creams, and over-the-counter medicines.  Any problems you or family members have had with anesthetic medicines.  Any blood disorders you have.  Any surgeries you have had.  Any medical conditions you have. What are the risks? Generally, this is a safe procedure. However, problems may occur, including:  Infection.  Bleeding.  Allergic reactions to medicines or dyes.  Damage to other structures or  organs.  Leaking from where the colon was sewn together.  Future blockage of the small intestines from scar tissue. Another surgery may be needed to repair this.  Needing to convert to an open procedure. Complications such as damage to other organs or excessive bleeding may require the surgeon to convert from a laparoscopic procedure to an open procedure. This involves making a larger incision in the abdomen. Medicines  Ask your health care provider about: ? Changing or stopping your regular medicines. This is especially important if you are taking diabetes medicines or blood thinners. ? Taking medicines such as aspirin and ibuprofen. These medicines can thin your blood. Do not take these medicines before your procedure if your health care provider instructs you not to.  You may be given antibiotic medicine to clean out bacteria from your colon. Follow the directions carefully and take the medicine at the correct time. General instructions  You may be prescribed an oral bowel prep to clean out your colon in preparation for the surgery: ? Follow instructions from your health care provider about how to do this. ? Do not eat or drink anything else after you have started the bowel prep, unless your health care provider tells you it is safe to do so.  Do not use any products that contain nicotine or tobacco, such as cigarettes and e-cigarettes. If you need help quitting, ask your health care provider. What happens during the procedure?  To reduce your risk of infection: ? Your health care team will wash or sanitize their hands. ? Your skin will be washed with soap.  An IV tube will be inserted into one of your veins to deliver fluid and medication.  You will be given one of the following: ? A medicine to help you relax (sedative). ? A medicine to make you fall asleep (general anesthetic).  Small monitors will be connected to your body. They will be used to check your heart, blood  pressure, and oxygen level.  A breathing tube may be placed into your lungs during the procedure.  A thin, flexible tube (catheter) will be placed into your bladder to drain urine.  A tube may be placed through your nose and into your stomach to drain stomach fluids (nasogastric tube, or NG tube).  Your abdomen will be filled with air so it expands. This gives the surgeon more room to operate and makes your organs easier to see.  Several small cuts (incisions) will be made in your abdomen.  A thin, lighted tube with a tiny camera on the end (laparoscope) will be put through one of the small incisions. The camera on the laparoscope will send a picture to a computer screen in the operating room. This will give the surgeon a good view inside your abdomen.  Hollow tubes will be put through the other small incisions in your abdomen. The tools that are needed for the procedure will be put through these tubes.  Clamps or staples will be put on both ends of the diseased part of the colon.  The part of the intestine between the clamps or staples will be removed.  If possible, the ends of the healthy colon that remain will be stitched (sutured) or stapled together to allow your body to pass waste (stool).  Sometimes, the remaining colon cannot be stitched back together. If this is the case, a colostomy will be needed. If you need a colostomy: ? An opening to the outside of your body (stoma) will be made through your abdomen. ? The end of your colon will be brought to the opening. It will be stitched to the skin. ? A bag will be attached to the opening. Stool will drain into this removable bag. ? The colostomy may be temporary or permanent.  The incisions from the colectomy will be closed with sutures or staples. The procedure may vary among health care providers and hospitals. What happens after the procedure?  Your blood pressure, heart rate, breathing rate, and blood oxygen level will be  monitored until the medicines you were given have worn off.  You will receive fluids through an IV tube until your bowels start to work properly.  Once your bowels are working again, you will be given clear liquids first and then solid food as tolerated.  You will be given medicines to control your pain and nausea, if needed.  Do not drive for 24 hours if you were given a sedative. This information is not intended to replace advice given to you by your health care provider. Make sure you discuss any questions you have with your health care provider. Document Revised: 07/17/2017 Document Reviewed: 05/05/2016 Elsevier Patient Education  2020 [employer].

## 2020-03-24 NOTE — H&P (Signed)
[facility] History and Physical  Reason for Referral: Colon cancer  Referring Physician: Dr. Laural Golden   Chief Complaint    New Patient (Initial Visit)      Sara Coffey is a 69 y.o. female.  HPI: Sara Coffey is a 69 yo who has a history of recently diagnosed colon cancer and unresected polyps in the ascending colon and hepatic flexure. She had a normal screening colonoscopy and this was found. She had her prior colonoscopy in 2012 when she had a sessile serrated polyp.  She reports some issues with hemorrhoids and minor bleeding but major bleeding.   She denies any abdominal pain, nausea, vomiting, and has no family history of colon cancer.   She has this vague history last year of concern for TIA which she said was ultimately thought to be a transient global amnesia and not a stroke. She has never had a heart attack or other cardiovascular event. She does mud runs and is active and has no chest pain or SOB.   Past Medical History:  Diagnosis Date  . Amnesia 04/17/2019  . Hemorrhoid 09/28/2013  . Hyperlipidemia   . Osteoporosis   . Syncope and collapse   . TGA (transient global amnesia) 04/18/2019  . TIA (transient ischemic attack) 04/17/2019  . Tobacco abuse     Past Surgical History:  Procedure Laterality Date  . BIOPSY  03/02/2020   Procedure: BIOPSY;  Surgeon: Rogene Houston, MD;  [facility]: [facility];  Service: Endoscopy;;  ascending colon mass  . COLONOSCOPY WITH PROPOFOL N/A 03/02/2020   Procedure: COLONOSCOPY WITH PROPOFOL;  Surgeon: Rogene Houston, MD;  Location: [facility];  Service: Endoscopy;  Laterality: N/A;  1055  . KNEE SX    . left eye surgery     . POLYPECTOMY  03/02/2020   Procedure: POLYPECTOMY;  Surgeon: Rogene Houston, MD;  [facility]: [facility];  Service: Endoscopy;;  . TUBAL LIGATION      Family History  Problem Relation Age of Onset  . Heart disease Father   . Hypertension Father   . Diabetes Father   . Hyperlipidemia  Brother   . Breast cancer Neg Hx     Social History   Tobacco Use  . Smoking status: Current Every Day Smoker    Packs/day: 1.00    Years: 40.00    Pack years: 40.00    Types: Cigarettes  . Smokeless tobacco: Never Used  Vaping Use  . Vaping Use: Never used  Substance Use Topics  . Alcohol use: No  . Drug use: No    Medications: I have reviewed the patient's current medications. Allergies as of 03/22/2020   No Known Allergies     Medication List       Accurate as of March 22, 2020 11:59 PM. If you have any questions, ask your nurse or doctor.        acetaminophen 500 MG tablet Commonly known as: TYLENOL Take 500 mg by mouth every 6 (six) hours as needed for headache.   calcium carbonate 500 MG chewable tablet Commonly known as: TUMS - dosed in mg elemental calcium Chew 1 tablet by mouth as needed for indigestion or heartburn.   Calcium-Vitamin D 600-400 MG-UNIT Tabs Take 1 tablet by mouth daily.   Dulcolax 5 MG EC tablet Generic drug: bisacodyl Take 4 tablets (20 mg total) by mouth once for 1 dose. Take at 7am the day prior to surgery. Started by: Virl Cagey, MD  Fish Oil 1000 MG Caps Take 2,000 mg by mouth in the morning and at bedtime.   melatonin 5 MG Tabs Take 5 mg by mouth as needed (sleep).   metroNIDAZOLE 500 MG tablet Commonly known as: Flagyl Take 2 tablets (1,000 mg total) by mouth as directed. Started by: Virl Cagey, MD   neomycin 500 MG tablet Commonly known as: MYCIFRADIN Take 2 tablets (1,000 mg total) by mouth as directed. Take 2 neomycin 500mg  tablets at 2pm, 3pm and 10 pm. Started by: Virl Cagey, MD   rosuvastatin 5 MG tablet Commonly known as: Crestor Take 1 tablet (5 mg total) by mouth daily.        ROS:  A comprehensive review of systems was negative except for: Gastrointestinal: positive for minor hemorrhoids   Blood pressure 114/74, pulse 71, temperature 98.6 F (37 C), temperature source Oral, resp.  rate 16, height 5\' 3"  (1.6 m), weight 131 lb (59.4 kg), SpO2 96 %. Physical Exam Vitals reviewed.  Constitutional:      Appearance: She is normal weight.  HENT:     Head: Normocephalic and atraumatic.     Nose: Nose normal.     Mouth/Throat:     Mouth: Mucous membranes are moist.  Eyes:     Extraocular Movements: Extraocular movements intact.     Pupils: Pupils are equal, round, and reactive to light.  Cardiovascular:     Rate and Rhythm: Normal rate and regular rhythm.  Pulmonary:     Effort: Pulmonary effort is normal.     Breath sounds: Normal breath sounds.  Abdominal:     General: There is no distension.     Palpations: Abdomen is soft.     Tenderness: There is no abdominal tenderness.  Musculoskeletal:        General: No swelling. Normal range of motion.     Cervical back: Normal range of motion.  Skin:    General: Skin is warm and dry.  Neurological:     General: No focal deficit present.     Mental Status: She is alert and oriented to person, place, and time.  Psychiatric:        Mood and Affect: Mood normal.        Behavior: Behavior normal.        Thought Content: Thought content normal.        Judgment: Judgment normal.     Results: CT personally reviewed- mass in the ascending colon, no signs of metastatic disease  CLINICAL DATA:  Recently diagnosed colon carcinoma by colonoscopy. Staging.  EXAM: CT ABDOMEN AND PELVIS WITH CONTRAST  TECHNIQUE: Multidetector CT imaging of the abdomen and pelvis was performed using the standard protocol following bolus administration of intravenous contrast.  CONTRAST:  123mL OMNIPAQUE IOHEXOL 300 MG/ML  SOLN  COMPARISON:  None.  FINDINGS: Lower Chest: No acute findings.  Hepatobiliary: No hepatic masses identified. 1.5 cm cyst seen in the anterior left lobe. Gallbladder is unremarkable. No evidence of biliary ductal dilatation.  Pancreas:  No mass or inflammatory changes.  Spleen: Within normal limits  in size and appearance.  Adrenals/Urinary Tract: No masses identified. Several tiny renal cysts noted bilaterally. No evidence of ureteral calculi or hydronephrosis.  Stomach/Bowel: On intraluminal mass is seen in the right: Just distal to the ileocecal valve which measures 4.6 x 2.4 cm on image 40/5. This is consistent with the known primary colon carcinoma. No evidence of obstruction, inflammatory process or abnormal fluid collections. Diverticulosis is seen mainly  involving the sigmoid colon, however there is no evidence of diverticulitis.  Vascular/Lymphatic: No pathologically enlarged lymph nodes. No abdominal aortic aneurysm. Aortic atherosclerosis noted.  Reproductive:  No mass or other significant abnormality.  Other:  None.  Musculoskeletal:  No suspicious bone lesions identified.  IMPRESSION: 4.6 cm intraluminal mass in the right colon, consistent with known primary colon carcinoma.  No evidence of metastatic disease within the abdomen or pelvis.  Sigmoid diverticulosis, without radiographic evidence of diverticulitis.  Aortic Atherosclerosis (ICD10-I70.0).   Electronically Signed   By: Marlaine Hind M.D.   On: 03/14/2020 11:20  Assessment & Plan:  ANDJELA WICKES is a 70 y.o. female with a newly diagnosed colon cancer. She is otherwise healthy and active.   -Discussed laparoscopic extended right hemicolectomy to ensure hepatic flexure polyps removed -Discussed risk of bleeding, infection, anastomosis, anastomotic leak, injury to other organs and ureter and need for an open procedure.  -Discussed preop colon preparation and also hospital course.   Colon Preparation:  Buy from the Store: Miralax bottle [phone]).  Gatorade 64 oz (not red). Dulcolax tablets.   The Day Prior to Surgery: Take 4 ducolax tablets at 7am with water. Drink plenty of clear liquids all day to avoid dehydration, no solid food.    Mix the bottle of Miralax and 64 oz of  Gatorade and drink this mixture starting at 10am. Drink it gradually over the next few hours, 8 ounces every 15-30 minutes until it is gone. Finish this by 2pm.  Take 2 neomycin 500mg  tablets and 2 metronidazole 500mg  tablets at 2 pm. Take 2 neomycin 500mg  tablets and 2 metronidazole 500mg  tablets at 3pm. Take 2 neomycin 500mg  tablets and 2 metronidazole 500mg  tablets at 10pm.    Do not eat or drink anything after midnight the night before your surgery.  Do not eat or drink anything that morning, and take medications as instructed by the hospital staff on your preoperative visit.   Discussed COVID test preop.   All questions were answered to the satisfaction of the patient and family.   Virl Cagey 03/24/2020, 10:19 AM

## 2020-03-26 ENCOUNTER — Telehealth: Payer: Self-pay | Admitting: Nurse Practitioner

## 2020-03-26 NOTE — Telephone Encounter (Signed)
Appointment scheduled.

## 2020-03-26 NOTE — Telephone Encounter (Signed)
Call placed to patient.   Reports that she has increased pain and urinary frequency x4 days.  Denies odor or blood to urine.   Please advise.

## 2020-03-26 NOTE — Telephone Encounter (Signed)
Make apt for tomorrow please.

## 2020-03-26 NOTE — Telephone Encounter (Signed)
Does patient need to return for another UA, or do you want to treat again?

## 2020-03-26 NOTE — Telephone Encounter (Signed)
I do not see recent UA. What are her sxs?

## 2020-03-26 NOTE — Telephone Encounter (Signed)
CB#[phone] Pt still has UTI hasn't clear up need another antibody call in the pharmcay [id number] [place], [place] - [address]

## 2020-03-27 ENCOUNTER — Other Ambulatory Visit: Payer: Self-pay

## 2020-03-27 ENCOUNTER — Ambulatory Visit (INDEPENDENT_AMBULATORY_CARE_PROVIDER_SITE_OTHER): Payer: Medicare HMO | Admitting: Nurse Practitioner

## 2020-03-27 ENCOUNTER — Encounter: Payer: Self-pay | Admitting: Nurse Practitioner

## 2020-03-27 VITALS — BP 110/72 | HR 75 | Temp 97.5°F | Ht 63.0 in | Wt 131.0 lb

## 2020-03-27 DIAGNOSIS — R319 Hematuria, unspecified: Secondary | ICD-10-CM | POA: Diagnosis not present

## 2020-03-27 DIAGNOSIS — R399 Unspecified symptoms and signs involving the genitourinary system: Secondary | ICD-10-CM | POA: Diagnosis not present

## 2020-03-27 DIAGNOSIS — N390 Urinary tract infection, site not specified: Secondary | ICD-10-CM

## 2020-03-27 LAB — URINALYSIS, ROUTINE W REFLEX MICROSCOPIC
Bilirubin Urine: NEGATIVE
Glucose, UA: NEGATIVE
Ketones, ur: NEGATIVE
Nitrite: NEGATIVE
Protein, ur: NEGATIVE
Specific Gravity, Urine: 1.01 (ref 1.001–1.03)
pH: 6.5 (ref 5.0–8.0)

## 2020-03-27 LAB — MICROSCOPIC MESSAGE

## 2020-03-27 MED ORDER — CIPROFLOXACIN HCL 250 MG PO TABS: 250.0000 mg | ORAL_TABLET | Freq: Two times a day (BID) | ORAL | 0 refills | Status: DC

## 2020-03-27 NOTE — Patient Instructions (Signed)
May continue AZO OTC  Drink plenty of water  Take medication as prescribed completing it.   Follow up for non resolving, new, or worsening sxs as discussed.

## 2020-03-27 NOTE — Progress Notes (Signed)
Established Patient Office Visit  Subjective:  Patient ID: Sara Coffey, female    DOB: 01-Dec-1950  Age: 69 y.o. MRN: [id number]  CC:  Chief Complaint  Patient presents with  . pain and pressure w/ urination    frequent urination  . appt for colon surgery    in 2 weeks     HPI Sara Coffey is a 69 year old female presenting to the clinic for sxs of urinary pelvic pressure pain and urinary frequency x4 days. She denies odor or blood to urine. She has tried Azo without resolution of sxs. No fever, chills, general body aches, cva pain/tenderness.   Past Medical History:  Diagnosis Date  . Amnesia 04/17/2019  . Hemorrhoid 09/28/2013  . Hyperlipidemia   . Osteoporosis   . Syncope and collapse   . TGA (transient global amnesia) 04/18/2019  . TIA (transient ischemic attack) 04/17/2019  . Tobacco abuse     Past Surgical History:  Procedure Laterality Date  . BIOPSY  03/02/2020   Procedure: BIOPSY;  Surgeon: Rogene Houston, MD;  [facility];  Service: Endoscopy;;  ascending colon mass  . COLONOSCOPY WITH PROPOFOL N/A 03/02/2020   Procedure: COLONOSCOPY WITH PROPOFOL;  Surgeon: Rogene Houston, MD;  [facility];  Service: Endoscopy;  Laterality: N/A;  1055  . KNEE SX    . left eye surgery     . POLYPECTOMY  03/02/2020   Procedure: POLYPECTOMY;  Surgeon: Rogene Houston, MD;  [facility];  Service: Endoscopy;;  . TUBAL LIGATION      Family History  Problem Relation Age of Onset  . Heart disease Father   . Hypertension Father   . Diabetes Father   . Hyperlipidemia Brother   . Breast cancer Neg Hx     Social History   Socioeconomic History  . Marital status: Married    Spouse name: Not on file  . Number of children: Not on file  . Years of education: Not on file  . Highest education level: Not on file  Occupational History  . Not on file  Tobacco Use  . Smoking status: Current Every Day Smoker    Packs/day: 1.00    Years:  40.00    Pack years: 40.00    Types: Cigarettes  . Smokeless tobacco: Never Used  Vaping Use  . Vaping Use: Never used  Substance and Sexual Activity  . Alcohol use: No  . Drug use: No  . Sexual activity: Not Currently    Birth control/protection: Surgical, Post-menopausal    Comment: tubal  Other Topics Concern  . Not on file  Social History Narrative  . Not on file   Social Determinants of Health   Financial Resource Strain:   . Difficulty of Paying Living Expenses:   Food Insecurity:   . Worried About Charity fundraiser in the Last Year:   . Arboriculturist in the Last Year:   Transportation Needs:   . Film/video editor (Medical):   Marland Kitchen Lack of Transportation (Non-Medical):   Physical Activity:   . Days of Exercise per Week:   . Minutes of Exercise per Session:   Stress:   . Feeling of Stress :   Social Connections:   . Frequency of Communication with Friends and Family:   . Frequency of Social Gatherings with Friends and Family:   . Attends Religious Services:   . Active Member of Clubs or Organizations:   .  Attends Archivist Meetings:   Marland Kitchen Marital Status:   Intimate Partner Violence:   . Fear of Current or Ex-Partner:   . Emotionally Abused:   Marland Kitchen Physically Abused:   . Sexually Abused:     Outpatient Medications Prior to Visit  Medication Sig Dispense Refill  . acetaminophen (TYLENOL) 500 MG tablet Take 500 mg by mouth every 6 (six) hours as needed for headache.    . Calcium Carb-Cholecalciferol (CALCIUM-VITAMIN D) 600-400 MG-UNIT TABS Take 1 tablet by mouth daily.    . calcium carbonate (TUMS - DOSED IN MG ELEMENTAL CALCIUM) 500 MG chewable tablet Chew 1 tablet by mouth as needed for indigestion or heartburn.    . Melatonin 5 MG TABS Take 5 mg by mouth as needed (sleep).    . metroNIDAZOLE (FLAGYL) 500 MG tablet Take 2 tablets (1,000 mg total) by mouth as directed. 6 tablet 0  . neomycin (MYCIFRADIN) 500 MG tablet Take 2 tablets (1,000 mg total)  by mouth as directed. Take 2 neomycin 500mg  tablets at 2pm, 3pm and 10 pm. 6 tablet 0  . Omega-3 Fatty Acids (FISH OIL) 1000 MG CAPS Take 2,000 mg by mouth in the morning and at bedtime.    . rosuvastatin (CRESTOR) 5 MG tablet Take 1 tablet (5 mg total) by mouth daily. 30 tablet 6   No facility-administered medications prior to visit.    No Known Allergies  ROS Review of Systems  All other systems reviewed and are negative.     Objective:    Physical Exam Vitals and nursing note reviewed.  Constitutional:      General: She is not in acute distress.    Appearance: Normal appearance. She is not ill-appearing, toxic-appearing or diaphoretic.  HENT:     Head: Normocephalic.  Eyes:     Extraocular Movements: Extraocular movements intact.     Conjunctiva/sclera: Conjunctivae normal.     Pupils: Pupils are equal, round, and reactive to light.  Cardiovascular:     Rate and Rhythm: Normal rate.  Pulmonary:     Effort: Pulmonary effort is normal.  Abdominal:     General: There is no distension.     Tenderness: There is no abdominal tenderness. There is no right CVA tenderness, left CVA tenderness or guarding.  Musculoskeletal:     Cervical back: Normal range of motion and neck supple.     Right lower leg: No edema.     Left lower leg: No edema.  Skin:    General: Skin is warm and dry.     Coloration: Skin is not jaundiced or pale.  Neurological:     General: No focal deficit present.     Mental Status: She is alert and oriented to person, place, and time.  Psychiatric:        Attention and Perception: Attention and perception normal.        Mood and Affect: Mood and affect normal.        Speech: Speech normal.        Behavior: Behavior normal.        Thought Content: Thought content normal.        Cognition and Memory: Cognition normal.        Judgment: Judgment normal.     BP 110/72   Pulse 75   Temp (!) 97.5 F (36.4 C)   Ht 5\' 3"  (1.6 m)   Wt 131 lb (59.4 kg)    SpO2 99%   BMI 23.21 kg/m  Wt Readings from Last 3 Encounters:  03/27/20 131 lb (59.4 kg)  03/22/20 131 lb (59.4 kg)  03/19/20 131 lb 9.6 oz (59.7 kg)     Health Maintenance Due  Topic Date Due  . Hepatitis C Screening  Never done  . TETANUS/TDAP  Never done  . INFLUENZA VACCINE  03/18/2020    There are no preventive care reminders to display for this patient.  Lab Results  Component Value Date   TSH 1.656 09/28/2013   Lab Results  Component Value Date   WBC 5.9 09/19/2019   HGB 12.6 09/19/2019   HCT 38.0 09/19/2019   MCV 94.1 09/19/2019   PLT 283 09/19/2019   Lab Results  Component Value Date   NA 139 09/19/2019   K 4.3 09/19/2019   CO2 26 09/19/2019   GLUCOSE 88 09/19/2019   BUN 11 09/19/2019   CREATININE 0.70 03/14/2020   BILITOT 0.3 09/19/2019   ALKPHOS 66 04/17/2019   AST 16 09/19/2019   ALT 10 09/19/2019   PROT 6.8 09/19/2019   ALBUMIN 4.2 04/17/2019   CALCIUM 9.6 09/19/2019   ANIONGAP 13 04/17/2019   Lab Results  Component Value Date   CHOL 246 (H) 03/12/2020   Lab Results  Component Value Date   HDL 51 03/12/2020   Lab Results  Component Value Date   LDLCALC 166 (H) 03/12/2020   Lab Results  Component Value Date   TRIG 149 03/12/2020   Lab Results  Component Value Date   CHOLHDL 4.8 03/12/2020   Lab Results  Component Value Date   HGBA1C 5.0 03/12/2020      Assessment & Plan:   Problem List Items Addressed This Visit    None    Visit Diagnoses    UTI symptoms    -  Primary   Relevant Orders   Urinalysis, Routine w reflex microscopic   Urinary tract infection with hematuria, site unspecified       Relevant Medications   ciprofloxacin (CIPRO) 250 MG tablet   Other Relevant Orders   CULTURE, URINE COMPREHENSIVE    May continue AZO OTC  Drink plenty of water  Take medication as prescribed completing it.   Follow up for non resolving, new, or worsening sxs as discussed.  Meds ordered this encounter  Medications  .  ciprofloxacin (CIPRO) 250 MG tablet    Sig: Take 1 tablet (250 mg total) by mouth 2 (two) times daily for 7 days.    Dispense:  14 tablet    Refill:  0    Follow-up: Return if symptoms worsen or fail to improve.    Annie Main, FNP

## 2020-03-28 LAB — URINE CULTURE
MICRO NUMBER:: 10808377
SPECIMEN QUALITY:: ADEQUATE

## 2020-04-04 ENCOUNTER — Telehealth: Payer: Self-pay

## 2020-04-04 NOTE — Telephone Encounter (Signed)
Patient's daughter, Jonelle Sidle called in today stating her mother appears to have had another transient global amnesia event after breaking down on the side of the road. Her daughter states she does not have any stroke symptoms, however her provider advised to take her to the ER for evaluation. She verbalizes understanding.

## 2020-04-05 MED ORDER — ENSURE PRE-SURGERY PO LIQD
296.0000 mL | Freq: Once | ORAL | Status: DC
  Filled 2020-04-05: qty 296

## 2020-04-05 MED ORDER — ENSURE PRE-SURGERY PO LIQD
592.0000 mL | Freq: Once | ORAL | Status: DC
  Filled 2020-04-05: qty 592

## 2020-04-05 NOTE — Patient Instructions (Signed)
Sara Coffey  04/05/2020     @PREFPERIOPPHARMACY @   Your procedure is scheduled on 04/09/2020.  Report to [facility] at  0730  A.M.  Call this number if you have problems the morning of surgery:  [phone]   Remember:  The night before your surgery, right before bed, drink 2 carb loading drinks. You may have clear liquids until 0530 am. You must be finished with your 3rd carb loading drink at 0600. Then nothing to eat or drink after that.                   Take these medicines the morning of surgery with A SIP OF WATER None    Do not wear jewelry, make-up or nail polish.  Do not wear lotions, powders, or perfumes. Please wear deodorant and brush your teeth.  Do not shave 48 hours prior to surgery.  Men may shave face and neck.  Do not bring valuables to the hospital.  [facility] is not responsible for any belongings or valuables.  Contacts, dentures or bridgework may not be worn into surgery.  Leave your suitcase in the car.  After surgery it may be brought to your room.  For patients admitted to the hospital, discharge time will be determined by your treatment team.  Patients discharged the day of surgery will not be allowed to drive home.   Name and phone number of your driver:   family Special instructions:  DO NOT smoke the morning of your procedure.  Please read over the following fact sheets that you were given. Pain Booklet, Coughing and Deep Breathing, Blood Transfusion Information, Surgical Site Infection Prevention, Anesthesia Post-op Instructions and Care and Recovery After Surgery       Open Colectomy, Care After This sheet gives you information about how to care for yourself after your procedure. Your health care provider may also give you more specific instructions. If you have problems or questions, contact your health care provider. What can I expect after the procedure? After the procedure, it is common to have:  Pain in your  abdomen, especially along your incision.  Tiredness. Your energy level will return to normal over the next several weeks.  Constipation.  Nausea.  Difficulty urinating. Follow these instructions at home: Activity  You may be able to return to most of your normal activities within 1-2 weeks, such as working, walking up stairs, and sexual activity.  Avoid activities that require a lot of energy for 4-6 weeks after surgery, such as running, climbing, and lifting heavy objects. Ask your health care provider what activities are safe for you.  Take rest breaks during the day as needed.  Do not drive for 1-2 weeks or until your health care provider says that it is safe.  Do not drive or use heavy machinery while taking prescription pain medicines.  Do not lift anything that is heavier than 10 lb (4.3 kg) until your health care provider says that it is safe. Incision care   Follow instructions from your health care provider about how to take care of your incision. Make sure you: ? Wash your hands with soap and water before you change your bandage (dressing). If soap and water are not available, use hand sanitizer. ? Change your dressing as told by your health care provider. ? Leave stitches (sutures) or staples in place. These skin closures may need to stay in place for 2 weeks  or longer.  Avoid wearing tight clothing around your incision.  Protect your incision area from the sun.  Check your incision area every day for signs of infection. Check for: ? More redness, swelling, or pain. ? More fluid or blood. ? Warmth. ? Pus or a bad smell. General instructions  Do not take baths, swim, or use a hot tub until your health care provider approves. Ask your health care provider when you may shower.  Take over-the-counter and prescription medicines, including stool softeners, only as told by your health care provider.  Eat a low-fat and low-fiber diet for the first 4 weeks after  surgery.  Keep all follow-up visits as told by your health care provider. This is important. Contact a health care provider if:  You have more redness, swelling, or pain around your incision.  You have more fluid or blood coming from your incision.  Your incision feels warm to the touch.  You have pus or a bad smell coming from your incision.  You have a fever or chills.  You do not have a bowel movement 2-3 days after surgery.  You cannot eat or drink for 24 hours or more.  You have persistent nausea and vomiting.  You have abdominal pain that gets worse and does not get better with medicine. Get help right away if:  You have chest pain.  You have shortness of breath.  You have pain or swelling in your legs.  Your incision breaks open after your sutures or staples have been removed.  You have bleeding from the rectum. This information is not intended to replace advice given to you by your health care provider. Make sure you discuss any questions you have with your health care provider. Document Revised: 07/17/2017 Document Reviewed: 05/05/2016 Elsevier Patient Education  [place].  Laparoscopic Colectomy, Care After This sheet gives you information about how to care for yourself after your procedure. Your health care provider may also give you more specific instructions. If you have problems or questions, contact your health care provider. What can I expect after the procedure? After your procedure, it is common to have the following:  Pain in your abdomen, especially in the incision areas. You will be given medicine to control the pain.  Tiredness. This is a normal part of the recovery process. Your energy level will return to normal over the next several weeks.  Changes in your bowel movements, such as constipation or needing to go more often. Talk with your health care provider about how to manage this. Follow these instructions at home: Medicines  Take  over-the-counter and prescription medicines only as told by your health care provider.  Do not drive or use heavy machinery while taking prescription pain medicine.  Do not drink alcohol while taking prescription pain medicine.  If you were prescribed an antibiotic medicine, use it as told by your health care provider. Do not stop using the antibiotic even if you start to feel better. Incision care   Follow instructions from your health care provider about how to take care of your incision areas. Make sure you: ? Keep your incisions clean and dry. ? Wash your hands with soap and water before and after applying medicine to the areas, and before and after changing your bandage (dressing). If soap and water are not available, use hand sanitizer. ? Change your dressing as told by your health care provider. ? Leave stitches (sutures), skin glue, or adhesive strips in place. These skin  closures may need to stay in place for 2 weeks or longer. If adhesive strip edges start to loosen and curl up, you may trim the loose edges. Do not remove adhesive strips completely unless your health care provider tells you to do that.  Do not wear tight clothing over the incisions. Tight clothing may rub and irritate the incision areas, which may cause the incisions to open.  Do not take baths, swim, or use a hot tub until your health care provider approves. Ask your health care provider if you can take showers. You may only be allowed to take sponge baths for bathing.  Check your incision area every day for signs of infection. Check for: ? More redness, swelling, or pain. ? More fluid or blood. ? Warmth. ? Pus or a bad smell. Activity  Avoid lifting anything that is heavier than 10 lb (4.5 kg) for 2 weeks or until your health care provider says it is okay.  You may resume normal activities as told by your health care provider. Ask your health care provider what activities are safe for you.  Take rest breaks  during the day as needed. Eating and drinking  Follow instructions from your health care provider about what you can eat after surgery.  To prevent or treat constipation while you are taking prescription pain medicine, your health care provider may recommend that you: ? Drink enough fluid to keep your urine clear or pale yellow. ? Take over-the-counter or prescription medicines. ? Eat foods that are high in fiber, such as fresh fruits and vegetables, whole grains, and beans. ? Limit foods that are high in fat and processed sugars, such as fried and sweet foods. General instructions  Ask your health care provider when you will need an appointment to get your sutures or staples removed.  Keep all follow-up visits as told by your health care provider. This is important. Contact a health care provider if:  You have more redness, swelling, or pain around your incisions.  You have more fluid or blood coming from the incisions.  Your incisions feel warm to the touch.  You have pus or a bad smell coming from your incisions or your dressing.  You have a fever.  You have an incision that breaks open (edges not staying together) after sutures or staples have been removed. Get help right away if:  You develop a rash.  You have chest pain or difficulty breathing.  You have pain or swelling in your legs.  You feel light-headed or you faint.  Your abdomen swells (becomes distended).  You have nausea or vomiting.  You have blood in your stool (feces). This information is not intended to replace advice given to you by your health care provider. Make sure you discuss any questions you have with your health care provider. Document Revised: 04/23/2018 Document Reviewed: 05/05/2016 Elsevier Patient Education  2020 [place] Anesthesia, Adult, Care After This sheet gives you information about how to care for yourself after your procedure. Your health care provider may also  give you more specific instructions. If you have problems or questions, contact your health care provider. What can I expect after the procedure? After the procedure, the following side effects are common:  Pain or discomfort at the IV site.  Nausea.  Vomiting.  Sore throat.  Trouble concentrating.  Feeling cold or chills.  Weak or tired.  Sleepiness and fatigue.  Soreness and body aches. These side effects can affect parts of  the body that were not involved in surgery. Follow these instructions at home:  For at least 24 hours after the procedure:  Have a responsible adult stay with you. It is important to have someone help care for you until you are awake and alert.  Rest as needed.  Do not: ? Participate in activities in which you could fall or become injured. ? Drive. ? Use heavy machinery. ? Drink alcohol. ? Take sleeping pills or medicines that cause drowsiness. ? Make important decisions or sign legal documents. ? Take care of children on your own. Eating and drinking  Follow any instructions from your health care provider about eating or drinking restrictions.  When you feel hungry, start by eating small amounts of foods that are soft and easy to digest (bland), such as toast. Gradually return to your regular diet.  Drink enough fluid to keep your urine pale yellow.  If you vomit, rehydrate by drinking water, juice, or clear broth. General instructions  If you have sleep apnea, surgery and certain medicines can increase your risk for breathing problems. Follow instructions from your health care provider about wearing your sleep device: ? Anytime you are sleeping, including during daytime naps. ? While taking prescription pain medicines, sleeping medicines, or medicines that make you drowsy.  Return to your normal activities as told by your health care provider. Ask your health care provider what activities are safe for you.  Take over-the-counter and  prescription medicines only as told by your health care provider.  If you smoke, do not smoke without supervision.  Keep all follow-up visits as told by your health care provider. This is important. Contact a health care provider if:  You have nausea or vomiting that does not get better with medicine.  You cannot eat or drink without vomiting.  You have pain that does not get better with medicine.  You are unable to pass urine.  You develop a skin rash.  You have a fever.  You have redness around your IV site that gets worse. Get help right away if:  You have difficulty breathing.  You have chest pain.  You have blood in your urine or stool, or you vomit blood. Summary  After the procedure, it is common to have a sore throat or nausea. It is also common to feel tired.  Have a responsible adult stay with you for the first 24 hours after general anesthesia. It is important to have someone help care for you until you are awake and alert.  When you feel hungry, start by eating small amounts of foods that are soft and easy to digest (bland), such as toast. Gradually return to your regular diet.  Drink enough fluid to keep your urine pale yellow.  Return to your normal activities as told by your health care provider. Ask your health care provider what activities are safe for you. This information is not intended to replace advice given to you by your health care provider. Make sure you discuss any questions you have with your health care provider. Document Revised: 08/07/2017 Document Reviewed: 03/20/2017 Elsevier Patient Education  [place]. How to Use Chlorhexidine for Bathing Chlorhexidine gluconate (CHG) is a germ-killing (antiseptic) solution that is used to clean the skin. It can get rid of the bacteria that normally live on the skin and can keep them away for about 24 hours. To clean your skin with CHG, you may be given:  A CHG solution to use in the shower  or  as part of a sponge bath.  A prepackaged cloth that contains CHG. Cleaning your skin with CHG may help lower the risk for infection:  While you are staying in the intensive care unit of the hospital.  If you have a vascular access, such as a central line, to provide short-term or long-term access to your veins.  If you have a catheter to drain urine from your bladder.  If you are on a ventilator. A ventilator is a machine that helps you breathe by moving air in and out of your lungs.  After surgery. What are the risks? Risks of using CHG include:  A skin reaction.  Hearing loss, if CHG gets in your ears.  Eye injury, if CHG gets in your eyes and is not rinsed out.  The CHG product catching fire. Make sure that you avoid smoking and flames after applying CHG to your skin. Do not use CHG:  If you have a chlorhexidine allergy or have previously reacted to chlorhexidine.  On babies younger than 67 months of age. How to use CHG solution  Use CHG only as told by your health care provider, and follow the instructions on the label.  Use the full amount of CHG as directed. Usually, this is one bottle. During a shower Follow these steps when using CHG solution during a shower (unless your health care provider gives you different instructions): 1. Start the shower. 2. Use your normal soap and shampoo to wash your face and hair. 3. Turn off the shower or move out of the shower stream. 4. Pour the CHG onto a clean washcloth. Do not use any type of brush or rough-edged sponge. 5. Starting at your neck, lather your body down to your toes. Make sure you follow these instructions: ? If you will be having surgery, pay special attention to the part of your body where you will be having surgery. Scrub this area for at least 1 minute. ? Do not use CHG on your head or face. If the solution gets into your ears or eyes, rinse them well with water. ? Avoid your genital area. ? Avoid any areas of  skin that have broken skin, cuts, or scrapes. ? Scrub your back and under your arms. Make sure to wash skin folds. 6. Let the lather sit on your skin for 1-2 minutes or as long as told by your health care provider. 7. Thoroughly rinse your entire body in the shower. Make sure that all body creases and crevices are rinsed well. 8. Dry off with a clean towel. Do not put any substances on your body afterward--such as powder, lotion, or perfume--unless you are told to do so by your health care provider. Only use lotions that are recommended by the manufacturer. 9. Put on clean clothes or pajamas. 10. If it is the night before your surgery, sleep in clean sheets.  During a sponge bath Follow these steps when using CHG solution during a sponge bath (unless your health care provider gives you different instructions): 1. Use your normal soap and shampoo to wash your face and hair. 2. Pour the CHG onto a clean washcloth. 3. Starting at your neck, lather your body down to your toes. Make sure you follow these instructions: ? If you will be having surgery, pay special attention to the part of your body where you will be having surgery. Scrub this area for at least 1 minute. ? Do not use CHG on your head or face.  If the solution gets into your ears or eyes, rinse them well with water. ? Avoid your genital area. ? Avoid any areas of skin that have broken skin, cuts, or scrapes. ? Scrub your back and under your arms. Make sure to wash skin folds. 4. Let the lather sit on your skin for 1-2 minutes or as long as told by your health care provider. 5. Using a different clean, wet washcloth, thoroughly rinse your entire body. Make sure that all body creases and crevices are rinsed well. 6. Dry off with a clean towel. Do not put any substances on your body afterward--such as powder, lotion, or perfume--unless you are told to do so by your health care provider. Only use lotions that are recommended by the  manufacturer. 7. Put on clean clothes or pajamas. 8. If it is the night before your surgery, sleep in clean sheets. How to use CHG prepackaged cloths  Only use CHG cloths as told by your health care provider, and follow the instructions on the label.  Use the CHG cloth on clean, dry skin.  Do not use the CHG cloth on your head or face unless your health care provider tells you to.  When washing with the CHG cloth: ? Avoid your genital area. ? Avoid any areas of skin that have broken skin, cuts, or scrapes. Before surgery Follow these steps when using a CHG cloth to clean before surgery (unless your health care provider gives you different instructions): 1. Using the CHG cloth, vigorously scrub the part of your body where you will be having surgery. Scrub using a back-and-forth motion for 3 minutes. The area on your body should be completely wet with CHG when you are done scrubbing. 2. Do not rinse. Discard the cloth and let the area air-dry. Do not put any substances on the area afterward, such as powder, lotion, or perfume. 3. Put on clean clothes or pajamas. 4. If it is the night before your surgery, sleep in clean sheets.  For general bathing Follow these steps when using CHG cloths for general bathing (unless your health care provider gives you different instructions). 1. Use a separate CHG cloth for each area of your body. Make sure you wash between any folds of skin and between your fingers and toes. Wash your body in the following order, switching to a new cloth after each step: ? The front of your neck, shoulders, and chest. ? Both of your arms, under your arms, and your hands. ? Your stomach and groin area, avoiding the genitals. ? Your right leg and foot. ? Your left leg and foot. ? The back of your neck, your back, and your buttocks. 2. Do not rinse. Discard the cloth and let the area air-dry. Do not put any substances on your body afterward--such as powder, lotion, or  perfume--unless you are told to do so by your health care provider. Only use lotions that are recommended by the manufacturer. 3. Put on clean clothes or pajamas. Contact a health care provider if:  Your skin gets irritated after scrubbing.  You have questions about using your solution or cloth. Get help right away if:  Your eyes become very red or swollen.  Your eyes itch badly.  Your skin itches badly and is red or swollen.  Your hearing changes.  You have trouble seeing.  You have swelling or tingling in your mouth or throat.  You have trouble breathing.  You swallow any chlorhexidine. Summary  Chlorhexidine gluconate (CHG)  is a germ-killing (antiseptic) solution that is used to clean the skin. Cleaning your skin with CHG may help to lower your risk for infection.  You may be given CHG to use for bathing. It may be in a bottle or in a prepackaged cloth to use on your skin. Carefully follow your health care provider's instructions and the instructions on the product label.  Do not use CHG if you have a chlorhexidine allergy.  Contact your health care provider if your skin gets irritated after scrubbing. This information is not intended to replace advice given to you by your health care provider. Make sure you discuss any questions you have with your health care provider. Document Revised: 10/21/2018 Document Reviewed: 07/02/2017 Elsevier Patient Education  [place].

## 2020-04-06 ENCOUNTER — Encounter (HOSPITAL_COMMUNITY): Payer: Self-pay

## 2020-04-06 ENCOUNTER — Other Ambulatory Visit: Payer: Self-pay

## 2020-04-06 ENCOUNTER — Other Ambulatory Visit (HOSPITAL_COMMUNITY)
Admission: RE | Admit: 2020-04-06 | Discharge: 2020-04-06 | Disposition: A | Discharge status: Home / Self Care | Payer: Medicare HMO | Source: Ambulatory Visit | Attending: General Surgery | Admitting: General Surgery

## 2020-04-06 ENCOUNTER — Encounter (HOSPITAL_COMMUNITY)
Admission: RE | Admit: 2020-04-06 | Discharge: 2020-04-06 | Disposition: A | Discharge status: Home / Self Care | Payer: Medicare HMO | Source: Ambulatory Visit | Attending: General Surgery | Admitting: General Surgery

## 2020-04-06 DIAGNOSIS — Z20822 Contact with and (suspected) exposure to covid-19: Secondary | ICD-10-CM | POA: Diagnosis not present

## 2020-04-06 DIAGNOSIS — Z01818 Encounter for other preprocedural examination: Secondary | ICD-10-CM | POA: Diagnosis not present

## 2020-04-06 LAB — CBC WITH DIFFERENTIAL/PLATELET
Abs Immature Granulocytes: 0.01 10*3/uL (ref 0.00–0.07)
Basophils Absolute: 0.1 10*3/uL (ref 0.0–0.1)
Basophils Relative: 2 %
Eosinophils Absolute: 0.3 10*3/uL (ref 0.0–0.5)
Eosinophils Relative: 6 %
HCT: 35.4 % — ABNORMAL LOW (ref 36.0–46.0)
Hemoglobin: 11.2 g/dL — ABNORMAL LOW (ref 12.0–15.0)
Immature Granulocytes: 0 %
Lymphocytes Relative: 33 %
Lymphs Abs: 1.7 10*3/uL (ref 0.7–4.0)
MCH: 29.9 pg (ref 26.0–34.0)
MCHC: 31.6 g/dL (ref 30.0–36.0)
MCV: 94.7 fL (ref 80.0–100.0)
Monocytes Absolute: 0.4 10*3/uL (ref 0.1–1.0)
Monocytes Relative: 8 %
Neutro Abs: 2.7 10*3/uL (ref 1.7–7.7)
Neutrophils Relative %: 51 %
Platelets: 359 10*3/uL (ref 150–400)
RBC: 3.74 MIL/uL — ABNORMAL LOW (ref 3.87–5.11)
RDW: 13.6 % (ref 11.5–15.5)
WBC: 5.2 10*3/uL (ref 4.0–10.5)
nRBC: 0 % (ref 0.0–0.2)

## 2020-04-06 LAB — BASIC METABOLIC PANEL
Anion gap: 10 (ref 5–15)
BUN: 12 mg/dL (ref 8–23)
CO2: 25 mmol/L (ref 22–32)
Calcium: 9.5 mg/dL (ref 8.9–10.3)
Chloride: 106 mmol/L (ref 98–111)
Creatinine, Ser: 0.68 mg/dL (ref 0.44–1.00)
GFR calc Af Amer: >60 mL/min (ref >60)
GFR calc non Af Amer: >60 mL/min (ref >60)
Glucose, Bld: 59 mg/dL — ABNORMAL LOW (ref 70–99)
Potassium: 3.7 mmol/L (ref 3.5–5.1)
Sodium: 141 mmol/L (ref 135–145)

## 2020-04-06 LAB — SARS CORONAVIRUS 2 (TAT 6-24 HRS): SARS Coronavirus 2: NEGATIVE

## 2020-04-06 LAB — HEMOGLOBIN A1C
Hgb A1c MFr Bld: 5.2 % (ref 4.8–5.6)
Mean Plasma Glucose: 102.54 mg/dL

## 2020-04-06 LAB — TYPE AND SCREEN
ABO/RH(D): O NEG
Antibody Screen: NEGATIVE

## 2020-04-06 LAB — PREPARE RBC (CROSSMATCH)

## 2020-04-07 LAB — CEA: CEA: 3.6 ng/mL (ref 0.0–4.7)

## 2020-04-09 ENCOUNTER — Telehealth: Payer: Self-pay

## 2020-04-09 ENCOUNTER — Inpatient Hospital Stay (HOSPITAL_COMMUNITY): Payer: Medicare HMO | Admitting: Anesthesiology

## 2020-04-09 ENCOUNTER — Encounter (HOSPITAL_COMMUNITY)
Admission: RE | Disposition: A | Discharge status: Home / Self Care | Payer: Self-pay | Source: Home / Self Care | Attending: General Surgery

## 2020-04-09 ENCOUNTER — Other Ambulatory Visit: Payer: Self-pay

## 2020-04-09 ENCOUNTER — Inpatient Hospital Stay (HOSPITAL_COMMUNITY)
Admission: RE | Admit: 2020-04-09 | Discharge: 2020-04-12 | DRG: 330 | Disposition: A | Discharge status: Home / Self Care | Payer: Medicare HMO | Attending: General Surgery | Admitting: General Surgery

## 2020-04-09 ENCOUNTER — Encounter (HOSPITAL_COMMUNITY): Payer: Self-pay | Admitting: General Surgery

## 2020-04-09 DIAGNOSIS — D62 Acute posthemorrhagic anemia: Secondary | ICD-10-CM

## 2020-04-09 DIAGNOSIS — I700 Atherosclerosis of aorta: Secondary | ICD-10-CM | POA: Diagnosis present

## 2020-04-09 DIAGNOSIS — M810 Age-related osteoporosis without current pathological fracture: Secondary | ICD-10-CM | POA: Diagnosis not present

## 2020-04-09 DIAGNOSIS — K5730 Diverticulosis of large intestine without perforation or abscess without bleeding: Secondary | ICD-10-CM | POA: Diagnosis present

## 2020-04-09 DIAGNOSIS — Z833 Family history of diabetes mellitus: Secondary | ICD-10-CM

## 2020-04-09 DIAGNOSIS — K649 Unspecified hemorrhoids: Secondary | ICD-10-CM | POA: Diagnosis present

## 2020-04-09 DIAGNOSIS — Z8673 Personal history of transient ischemic attack (TIA), and cerebral infarction without residual deficits: Secondary | ICD-10-CM

## 2020-04-09 DIAGNOSIS — Z83438 Family history of other disorder of lipoprotein metabolism and other lipidemia: Secondary | ICD-10-CM

## 2020-04-09 DIAGNOSIS — K660 Peritoneal adhesions (postprocedural) (postinfection): Secondary | ICD-10-CM | POA: Diagnosis present

## 2020-04-09 DIAGNOSIS — Q438 Other specified congenital malformations of intestine: Secondary | ICD-10-CM | POA: Diagnosis not present

## 2020-04-09 DIAGNOSIS — D122 Benign neoplasm of ascending colon: Secondary | ICD-10-CM | POA: Diagnosis not present

## 2020-04-09 DIAGNOSIS — K921 Melena: Secondary | ICD-10-CM | POA: Diagnosis present

## 2020-04-09 DIAGNOSIS — R69 Illness, unspecified: Secondary | ICD-10-CM | POA: Diagnosis not present

## 2020-04-09 DIAGNOSIS — Z8719 Personal history of other diseases of the digestive system: Secondary | ICD-10-CM

## 2020-04-09 DIAGNOSIS — F17210 Nicotine dependence, cigarettes, uncomplicated: Secondary | ICD-10-CM | POA: Diagnosis present

## 2020-04-09 DIAGNOSIS — G454 Transient global amnesia: Secondary | ICD-10-CM | POA: Diagnosis present

## 2020-04-09 DIAGNOSIS — E785 Hyperlipidemia, unspecified: Secondary | ICD-10-CM | POA: Diagnosis not present

## 2020-04-09 DIAGNOSIS — Z8249 Family history of ischemic heart disease and other diseases of the circulatory system: Secondary | ICD-10-CM | POA: Diagnosis not present

## 2020-04-09 DIAGNOSIS — C182 Malignant neoplasm of ascending colon: Secondary | ICD-10-CM | POA: Diagnosis not present

## 2020-04-09 DIAGNOSIS — E876 Hypokalemia: Secondary | ICD-10-CM | POA: Diagnosis not present

## 2020-04-09 HISTORY — PX: LAPAROSCOPIC PARTIAL COLECTOMY: SHX5907

## 2020-04-09 SURGERY — LAPAROSCOPIC PARTIAL COLECTOMY
Anesthesia: General | Site: Abdomen

## 2020-04-09 MED ORDER — KETOROLAC TROMETHAMINE 15 MG/ML IJ SOLN
INTRAMUSCULAR | Status: AC
  Filled 2020-04-09: qty 1

## 2020-04-09 MED ORDER — ZOLPIDEM TARTRATE 5 MG PO TABS: 5.0000 mg | ORAL_TABLET | Freq: Every evening | ORAL | Status: DC | PRN

## 2020-04-09 MED ORDER — LACTATED RINGERS IV SOLN: INTRAVENOUS | Status: DC

## 2020-04-09 MED ORDER — CHLORHEXIDINE GLUCONATE 0.12 % MT SOLN
15.0000 mL | Freq: Once | OROMUCOSAL | Status: AC
  Administered 2020-04-09: 15 mL via OROMUCOSAL

## 2020-04-09 MED ORDER — PHENYLEPHRINE HCL (PRESSORS) 10 MG/ML IV SOLN
INTRAVENOUS | Status: DC | PRN
  Administered 2020-04-09 (×5): 80 ug via INTRAVENOUS

## 2020-04-09 MED ORDER — ALVIMOPAN 12 MG PO CAPS
12.0000 mg | ORAL_CAPSULE | ORAL | Status: AC
  Administered 2020-04-09: 12 mg via ORAL

## 2020-04-09 MED ORDER — ONDANSETRON HCL 4 MG/2ML IJ SOLN
4.0000 mg | Freq: Once | INTRAMUSCULAR | Status: AC | PRN
  Administered 2020-04-09: 4 mg via INTRAVENOUS

## 2020-04-09 MED ORDER — ENOXAPARIN SODIUM 40 MG/0.4ML ~~LOC~~ SOLN
40.0000 mg | SUBCUTANEOUS | Status: DC
  Administered 2020-04-10 – 2020-04-11 (×2): 40 mg via SUBCUTANEOUS
  Filled 2020-04-09 (×2): qty 0.4

## 2020-04-09 MED ORDER — SUGAMMADEX SODIUM 500 MG/5ML IV SOLN
INTRAVENOUS | Status: DC | PRN
  Administered 2020-04-09: 200 mg via INTRAVENOUS

## 2020-04-09 MED ORDER — LIDOCAINE 2% (20 MG/ML) 5 ML SYRINGE
INTRAMUSCULAR | Status: AC
  Filled 2020-04-09: qty 5

## 2020-04-09 MED ORDER — DIPHENHYDRAMINE HCL 12.5 MG/5ML PO ELIX: 12.5000 mg | ORAL_SOLUTION | Freq: Four times a day (QID) | ORAL | Status: DC | PRN

## 2020-04-09 MED ORDER — DEXAMETHASONE SODIUM PHOSPHATE 10 MG/ML IJ SOLN
INTRAMUSCULAR | Status: DC | PRN
  Administered 2020-04-09: 4 mg via INTRAVENOUS

## 2020-04-09 MED ORDER — METOPROLOL TARTRATE 5 MG/5ML IV SOLN: 5.0000 mg | Freq: Four times a day (QID) | INTRAVENOUS | Status: DC | PRN

## 2020-04-09 MED ORDER — PROPOFOL 10 MG/ML IV BOLUS
INTRAVENOUS | Status: AC
  Filled 2020-04-09: qty 20

## 2020-04-09 MED ORDER — ENSURE SURGERY PO LIQD
237.0000 mL | Freq: Two times a day (BID) | ORAL | Status: DC
  Filled 2020-04-09 (×8): qty 237

## 2020-04-09 MED ORDER — DEXAMETHASONE SODIUM PHOSPHATE 10 MG/ML IJ SOLN
INTRAMUSCULAR | Status: AC
  Filled 2020-04-09: qty 1

## 2020-04-09 MED ORDER — PHENYLEPHRINE 40 MCG/ML (10ML) SYRINGE FOR IV PUSH (FOR BLOOD PRESSURE SUPPORT)
PREFILLED_SYRINGE | INTRAVENOUS | Status: AC
  Filled 2020-04-09: qty 10

## 2020-04-09 MED ORDER — ACETAMINOPHEN 500 MG PO TABS
1000.0000 mg | ORAL_TABLET | Freq: Four times a day (QID) | ORAL | Status: DC
  Administered 2020-04-09 – 2020-04-11 (×9): 1000 mg via ORAL
  Filled 2020-04-09 (×12): qty 2

## 2020-04-09 MED ORDER — SIMETHICONE 80 MG PO CHEW: 40.0000 mg | CHEWABLE_TABLET | Freq: Four times a day (QID) | ORAL | Status: DC | PRN

## 2020-04-09 MED ORDER — MIDAZOLAM HCL 2 MG/2ML IJ SOLN
INTRAMUSCULAR | Status: AC
  Filled 2020-04-09: qty 2

## 2020-04-09 MED ORDER — BUPIVACAINE LIPOSOME 1.3 % IJ SUSP
INTRAMUSCULAR | Status: AC
  Filled 2020-04-09: qty 20

## 2020-04-09 MED ORDER — CHLORHEXIDINE GLUCONATE CLOTH 2 % EX PADS: 6.0000 | MEDICATED_PAD | Freq: Once | CUTANEOUS | Status: DC

## 2020-04-09 MED ORDER — ALUM & MAG HYDROXIDE-SIMETH 200-200-20 MG/5ML PO SUSP
30.0000 mL | Freq: Four times a day (QID) | ORAL | Status: DC | PRN
  Administered 2020-04-10 (×2): 30 mL via ORAL
  Filled 2020-04-09 (×3): qty 30

## 2020-04-09 MED ORDER — KETOROLAC TROMETHAMINE 15 MG/ML IJ SOLN
15.0000 mg | Freq: Four times a day (QID) | INTRAMUSCULAR | Status: DC
  Administered 2020-04-09 – 2020-04-11 (×9): 15 mg via INTRAVENOUS
  Filled 2020-04-09 (×8): qty 1

## 2020-04-09 MED ORDER — FENTANYL CITRATE (PF) 100 MCG/2ML IJ SOLN
INTRAMUSCULAR | Status: AC
  Filled 2020-04-09: qty 2

## 2020-04-09 MED ORDER — PROPOFOL 10 MG/ML IV BOLUS
INTRAVENOUS | Status: DC | PRN
  Administered 2020-04-09: 150 mg via INTRAVENOUS

## 2020-04-09 MED ORDER — SODIUM CHLORIDE 0.9 % IV SOLN
INTRAVENOUS | Status: AC
  Filled 2020-04-09: qty 2

## 2020-04-09 MED ORDER — SUCCINYLCHOLINE CHLORIDE 20 MG/ML IJ SOLN
INTRAMUSCULAR | Status: DC | PRN
  Administered 2020-04-09: 120 mg via INTRAVENOUS

## 2020-04-09 MED ORDER — ACETAMINOPHEN 500 MG PO TABS: 1000.0000 mg | ORAL_TABLET | ORAL | Status: DC

## 2020-04-09 MED ORDER — FENTANYL CITRATE (PF) 250 MCG/5ML IJ SOLN
INTRAMUSCULAR | Status: AC
  Filled 2020-04-09: qty 5

## 2020-04-09 MED ORDER — ONDANSETRON HCL 4 MG/2ML IJ SOLN
INTRAMUSCULAR | Status: AC
  Filled 2020-04-09: qty 2

## 2020-04-09 MED ORDER — FENTANYL CITRATE (PF) 100 MCG/2ML IJ SOLN
25.0000 ug | INTRAMUSCULAR | Status: DC | PRN
  Administered 2020-04-09 (×3): 50 ug via INTRAVENOUS
  Filled 2020-04-09 (×2): qty 2

## 2020-04-09 MED ORDER — ONDANSETRON HCL 4 MG/2ML IJ SOLN
4.0000 mg | Freq: Four times a day (QID) | INTRAMUSCULAR | Status: DC | PRN
  Filled 2020-04-09: qty 2

## 2020-04-09 MED ORDER — ONDANSETRON HCL 4 MG/2ML IJ SOLN
INTRAMUSCULAR | Status: DC | PRN
  Administered 2020-04-09: 4 mg via INTRAVENOUS

## 2020-04-09 MED ORDER — ROCURONIUM BROMIDE 100 MG/10ML IV SOLN
INTRAVENOUS | Status: DC | PRN
  Administered 2020-04-09: 10 mg via INTRAVENOUS
  Administered 2020-04-09: 40 mg via INTRAVENOUS
  Administered 2020-04-09: 10 mg via INTRAVENOUS

## 2020-04-09 MED ORDER — GLYCOPYRROLATE PF 0.2 MG/ML IJ SOSY
PREFILLED_SYRINGE | INTRAMUSCULAR | Status: AC
  Filled 2020-04-09: qty 1

## 2020-04-09 MED ORDER — ENOXAPARIN SODIUM 40 MG/0.4ML ~~LOC~~ SOLN
40.0000 mg | Freq: Once | SUBCUTANEOUS | Status: AC
  Administered 2020-04-09: 40 mg via SUBCUTANEOUS

## 2020-04-09 MED ORDER — FENTANYL CITRATE (PF) 100 MCG/2ML IJ SOLN
INTRAMUSCULAR | Status: DC | PRN
Start: 2020-04-09 — End: 2020-04-09
  Administered 2020-04-09 (×2): 50 ug via INTRAVENOUS
  Administered 2020-04-09: 100 ug via INTRAVENOUS
  Administered 2020-04-09 (×3): 50 ug via INTRAVENOUS

## 2020-04-09 MED ORDER — ENOXAPARIN SODIUM 40 MG/0.4ML ~~LOC~~ SOLN
SUBCUTANEOUS | Status: AC
  Filled 2020-04-09: qty 0.4

## 2020-04-09 MED ORDER — MORPHINE SULFATE (PF) 2 MG/ML IV SOLN: 2.0000 mg | INTRAVENOUS | Status: DC | PRN

## 2020-04-09 MED ORDER — ORAL CARE MOUTH RINSE: 15.0000 mL | Freq: Once | OROMUCOSAL | Status: AC

## 2020-04-09 MED ORDER — ACETAMINOPHEN 500 MG PO TABS
ORAL_TABLET | ORAL | Status: AC
  Filled 2020-04-09: qty 2

## 2020-04-09 MED ORDER — MIDAZOLAM HCL 2 MG/2ML IJ SOLN
INTRAMUSCULAR | Status: DC | PRN
  Administered 2020-04-09: 2 mg via INTRAVENOUS

## 2020-04-09 MED ORDER — SODIUM CHLORIDE 0.9 % IV SOLN
2.0000 g | INTRAVENOUS | Status: AC
  Administered 2020-04-09: 2 g via INTRAVENOUS

## 2020-04-09 MED ORDER — ONDANSETRON HCL 4 MG PO TABS
4.0000 mg | ORAL_TABLET | Freq: Four times a day (QID) | ORAL | Status: DC | PRN
  Filled 2020-04-09: qty 1

## 2020-04-09 MED ORDER — SODIUM CHLORIDE 0.9 % IV SOLN
2.0000 g | Freq: Two times a day (BID) | INTRAVENOUS | Status: AC
  Administered 2020-04-09 – 2020-04-10 (×2): 2 g via INTRAVENOUS
  Filled 2020-04-09 (×3): qty 2

## 2020-04-09 MED ORDER — ALVIMOPAN 12 MG PO CAPS
ORAL_CAPSULE | ORAL | Status: AC
  Filled 2020-04-09: qty 1

## 2020-04-09 MED ORDER — DIPHENHYDRAMINE HCL 50 MG/ML IJ SOLN: 12.5000 mg | Freq: Four times a day (QID) | INTRAMUSCULAR | Status: DC | PRN

## 2020-04-09 MED ORDER — SUCCINYLCHOLINE CHLORIDE 200 MG/10ML IV SOSY
PREFILLED_SYRINGE | INTRAVENOUS | Status: AC
  Filled 2020-04-09: qty 10

## 2020-04-09 MED ORDER — GLYCOPYRROLATE 0.2 MG/ML IJ SOLN
INTRAMUSCULAR | Status: DC | PRN
  Administered 2020-04-09: .2 mg via INTRAVENOUS

## 2020-04-09 MED ORDER — BUPIVACAINE LIPOSOME 1.3 % IJ SUSP
INTRAMUSCULAR | Status: DC | PRN
  Administered 2020-04-09: 20 mL

## 2020-04-09 MED ORDER — LIDOCAINE HCL (CARDIAC) PF 50 MG/5ML IV SOSY
PREFILLED_SYRINGE | INTRAVENOUS | Status: DC | PRN
  Administered 2020-04-09: 100 mg via INTRAVENOUS

## 2020-04-09 MED ORDER — ALVIMOPAN 12 MG PO CAPS
12.0000 mg | ORAL_CAPSULE | Freq: Two times a day (BID) | ORAL | Status: DC
  Administered 2020-04-10 – 2020-04-11 (×3): 12 mg via ORAL
  Filled 2020-04-09 (×3): qty 1

## 2020-04-09 MED ORDER — OXYCODONE HCL 5 MG PO TABS
5.0000 mg | ORAL_TABLET | ORAL | Status: DC | PRN
  Administered 2020-04-09: 5 mg via ORAL
  Filled 2020-04-09: qty 1

## 2020-04-09 MED ORDER — ROCURONIUM BROMIDE 10 MG/ML (PF) SYRINGE
PREFILLED_SYRINGE | INTRAVENOUS | Status: AC
  Filled 2020-04-09: qty 10

## 2020-04-09 MED ORDER — 0.9 % SODIUM CHLORIDE (POUR BTL) OPTIME
TOPICAL | Status: DC | PRN
  Administered 2020-04-09: 2000 mL
  Administered 2020-04-09: 1000 mL

## 2020-04-09 MED ORDER — GABAPENTIN 300 MG PO CAPS
300.0000 mg | ORAL_CAPSULE | Freq: Two times a day (BID) | ORAL | Status: DC
  Administered 2020-04-09 – 2020-04-12 (×6): 300 mg via ORAL
  Filled 2020-04-09 (×6): qty 1

## 2020-04-09 SURGICAL SUPPLY — 67 items
BAG HAMPER (MISCELLANEOUS) ×2 IMPLANT
CELLS DAT CNTRL 66122 CELL SVR (MISCELLANEOUS) IMPLANT
COVER LIGHT HANDLE STERIS (MISCELLANEOUS) ×4 IMPLANT
COVER WAND RF STERILE (DRAPES) ×2 IMPLANT
DRSG OPSITE POSTOP 4X8 (GAUZE/BANDAGES/DRESSINGS) ×2 IMPLANT
DRSG TEGADERM 2-3/8X2-3/4 SM (GAUZE/BANDAGES/DRESSINGS) ×4 IMPLANT
ELECT REM PT RETURN 9FT ADLT (ELECTROSURGICAL) ×2
ELECTRODE REM PT RTRN 9FT ADLT (ELECTROSURGICAL) ×1 IMPLANT
GAUZE SPONGE 4X4 12PLY STRL LF (GAUZE/BANDAGES/DRESSINGS) ×2 IMPLANT
GLOVE BIO SURGEON STRL SZ 6.5 (GLOVE) ×4 IMPLANT
GLOVE BIOGEL M 7.0 STRL (GLOVE) ×2 IMPLANT
GLOVE BIOGEL M STRL SZ7.5 (GLOVE) ×2 IMPLANT
GLOVE BIOGEL PI IND STRL 6.5 (GLOVE) ×2 IMPLANT
GLOVE BIOGEL PI IND STRL 7.0 (GLOVE) ×6 IMPLANT
GLOVE BIOGEL PI IND STRL 8 (GLOVE) IMPLANT
GLOVE BIOGEL PI INDICATOR 6.5 (GLOVE) ×2
GLOVE BIOGEL PI INDICATOR 7.0 (GLOVE) ×8
GLOVE BIOGEL PI INDICATOR 8 (GLOVE) ×1
GLOVE ECLIPSE 6.0 STRL STRAW (GLOVE) IMPLANT
GLOVE ECLIPSE 6.5 STRL STRAW (GLOVE) ×2 IMPLANT
GLOVE SURG SS PI 7.5 STRL IVOR (GLOVE) ×5 IMPLANT
GOWN STRL REUS W/ TWL LRG LVL3 (GOWN DISPOSABLE) IMPLANT
GOWN STRL REUS W/TWL LRG LVL3 (GOWN DISPOSABLE) ×14 IMPLANT
INST SET LAPROSCOPIC AP (KITS) ×2 IMPLANT
INST SET MAJOR GENERAL (KITS) ×2 IMPLANT
KIT TURNOVER CYSTO (KITS) ×2 IMPLANT
LIGASURE IMPACT 36 18CM CVD LR (INSTRUMENTS) ×1 IMPLANT
LIGASURE LAP ATLAS 10MM 37CM (INSTRUMENTS) ×1 IMPLANT
MANIFOLD NEPTUNE II (INSTRUMENTS) ×2 IMPLANT
NDL HYPO 18GX1.5 BLUNT FILL (NEEDLE) ×1 IMPLANT
NEEDLE HYPO 18GX1.5 BLUNT FILL (NEEDLE) ×2 IMPLANT
NEEDLE HYPO 22GX1.5 SAFETY (NEEDLE) ×2 IMPLANT
NS IRRIG 1000ML POUR BTL (IV SOLUTION) ×6 IMPLANT
PACK COLON (CUSTOM PROCEDURE TRAY) ×2 IMPLANT
PAD ARMBOARD 7.5X6 YLW CONV (MISCELLANEOUS) ×2 IMPLANT
PENCIL SMOKE EVACUATOR COATED (MISCELLANEOUS) ×2 IMPLANT
RELOAD LINEAR CUT PROX 55 BLUE (ENDOMECHANICALS) IMPLANT
RELOAD PROXIMATE 75MM BLUE (ENDOMECHANICALS) ×4 IMPLANT
RELOAD STAPLE 55 3.8 BLU REG (ENDOMECHANICALS) IMPLANT
RELOAD STAPLE 75 3.8 BLU REG (ENDOMECHANICALS) IMPLANT
RETRACTOR WND ALEXIS 18 MED (MISCELLANEOUS) IMPLANT
RETRACTOR WND ALEXIS 25 LRG (MISCELLANEOUS) IMPLANT
RETRACTOR WOUND ALXS 18CM MED (MISCELLANEOUS) IMPLANT
RTRCTR WOUND ALEXIS 18CM MED (MISCELLANEOUS)
RTRCTR WOUND ALEXIS 25CM LRG (MISCELLANEOUS)
RTRCTR WOUND ALEXIS O 18CM MED (MISCELLANEOUS) ×2
SET TUBE SMOKE EVAC HIGH FLOW (TUBING) ×2 IMPLANT
SHEET LAVH (DRAPES) ×2 IMPLANT
SPONGE GAUZE 2X2 8PLY STRL LF (GAUZE/BANDAGES/DRESSINGS) ×4 IMPLANT
SPONGE LAP 18X18 RF (DISPOSABLE) ×4 IMPLANT
STAPLER GUN LINEAR PROX 60 (STAPLE) ×2 IMPLANT
STAPLER PROXIMATE 55 BLUE (STAPLE) IMPLANT
STAPLER PROXIMATE 75MM BLUE (STAPLE) ×1 IMPLANT
STAPLER VISISTAT (STAPLE) ×2 IMPLANT
SUT PDS AB CT VIOLET #0 27IN (SUTURE) ×4 IMPLANT
SUT PROLENE 2 0 SH 30 (SUTURE) IMPLANT
SUT SILK 3 0 SH CR/8 (SUTURE) ×3 IMPLANT
SUT VIC AB 3-0 SH 27 (SUTURE) ×2
SUT VIC AB 3-0 SH 27X BRD (SUTURE) IMPLANT
SYR 10ML LL (SYRINGE) ×2 IMPLANT
SYR 20ML LL LF (SYRINGE) ×4 IMPLANT
TRAY FOLEY MTR SLVR 16FR STAT (SET/KITS/TRAYS/PACK) ×2 IMPLANT
TROCAR ENDO BLADELESS 11MM (ENDOMECHANICALS) ×2 IMPLANT
TROCAR XCEL NON-BLD 5MMX100MML (ENDOMECHANICALS) ×2 IMPLANT
TROCAR XCEL UNIV SLVE 11M 100M (ENDOMECHANICALS) ×2 IMPLANT
WARMER LAPAROSCOPE (MISCELLANEOUS) ×2 IMPLANT
YANKAUER SUCT BULB TIP 10FT TU (MISCELLANEOUS) ×4 IMPLANT

## 2020-04-09 NOTE — Progress Notes (Signed)
Instructed pt how to use incentive spirometer. Teach back demonstrated.

## 2020-04-09 NOTE — Interval H&P Note (Signed)
History and Physical Interval Note:  04/09/2020 8:44 AM  Sara Coffey  has presented today for surgery, with the diagnosis of Colon Cancer.  The various methods of treatment have been discussed with the patient and family. After consideration of risks, benefits and other options for treatment, the patient has consented to  Procedure(s) with comments: LAPAROSCOPIC PARTIAL COLECTOMY (N/A) - Dr. requests time 9:00 as a surgical intervention.  The patient's history has been reviewed, patient examined, no change in status, stable for surgery.  I have reviewed the patient's chart and labs.  Questions were answered to the patient's satisfaction.    No changes. Bowel prep completed.  Virl Cagey

## 2020-04-09 NOTE — Telephone Encounter (Signed)
Patients daughter Sara Coffey called to say her mother had another transient amnesia attack, however they did not go to the ER, it was the same as before so they stayed at home- they wanted Dr.Jenkins to know since the patient is scheduled for surgery on Monday. Left message on patients VM letting her know that office closes early on Friday and I was following up on the call. I assume this was brought to Dr.Jenkins attention prior to her surgery this morning.

## 2020-04-09 NOTE — Progress Notes (Signed)
Abdominal binder placed per Dr. Constance Haw

## 2020-04-09 NOTE — Transfer of Care (Signed)
Immediate Anesthesia Transfer of Care Note  Patient: Sara Coffey  Procedure(s) Performed: LAPAROSCOPIC PARTIAL COLECTOMY (N/A Abdomen)  Patient Location: PACU  Anesthesia Type:General  Level of Consciousness: awake, alert , oriented and patient cooperative  Airway & Oxygen Therapy: Patient Spontanous Breathing and Patient connected to nasal cannula oxygen  Post-op Assessment: Report given to RN, Post -op Vital signs reviewed and stable and Patient moving all extremities  Post vital signs: Reviewed and stable  Last Vitals:  Vitals Value Taken Time  BP 104/60 04/09/20 1101  Temp    Pulse 81 04/09/20 1101  Resp 11 04/09/20 1101  SpO2 98 % 04/09/20 1101  Vitals shown include unvalidated device data.  Last Pain: There were no vitals filed for this visit.       Complications: No complications documented.

## 2020-04-09 NOTE — Progress Notes (Signed)
[facility]  Patient's daughter notified surgery completed. Orders placed for admission.   Note from today from Ms. Childers about a new transient amnesia attack just noted by me. This was not known by me this AM and was not brought to my attention. I do not however think it would change the need for her surgery or would have resulted in canceling as she completed a bowel prep and has cancer.   Will monitor patient closely.   Curlene Labrum, MD [facility] [address] [place], [place] [phone] (office)

## 2020-04-09 NOTE — Anesthesia Preprocedure Evaluation (Signed)
Anesthesia Evaluation  Patient identified by MRN, date of birth, ID band Patient awake    Reviewed: Allergy & Precautions, H&P , NPO status , Patient's Chart, lab work & pertinent test results, reviewed documented beta blocker date and time   Airway Mallampati: II  TM Distance: >3 FB Neck ROM: full    Dental no notable dental hx.    Pulmonary neg pulmonary ROS, Current Smoker,    Pulmonary exam normal breath sounds clear to auscultation       Cardiovascular Exercise Tolerance: Good negative cardio ROS   Rhythm:regular Rate:Normal     Neuro/Psych TIAnegative psych ROS   GI/Hepatic negative GI ROS, Neg liver ROS,   Endo/Other  negative endocrine ROS  Renal/GU negative Renal ROS  negative genitourinary   Musculoskeletal   Abdominal   Peds  Hematology negative hematology ROS (+)   Anesthesia Other Findings   Reproductive/Obstetrics negative OB ROS                             Anesthesia Physical Anesthesia Plan  ASA: III  Anesthesia Plan: General   Post-op Pain Management:    Induction:   PONV Risk Score and Plan:   Airway Management Planned:   Additional Equipment:   Intra-op Plan:   Post-operative Plan:   Informed Consent: I have reviewed the patients History and Physical, chart, labs and discussed the procedure including the risks, benefits and alternatives for the proposed anesthesia with the patient or authorized representative who has indicated his/her understanding and acceptance.     Dental Advisory Given  Plan Discussed with: CRNA  Anesthesia Plan Comments:         Anesthesia Quick Evaluation

## 2020-04-09 NOTE — Anesthesia Postprocedure Evaluation (Signed)
Anesthesia Post Note  Patient: Sara Coffey  Procedure(s) Performed: LAPAROSCOPIC PARTIAL COLECTOMY (N/A Abdomen)  Patient location during evaluation: PACU Anesthesia Type: General Level of consciousness: awake, oriented, awake and alert and patient cooperative Pain management: pain level controlled Vital Signs Assessment: post-procedure vital signs reviewed and stable Respiratory status: spontaneous breathing, nonlabored ventilation, patient connected to nasal cannula oxygen and respiratory function stable Cardiovascular status: blood pressure returned to baseline and stable Postop Assessment: no headache and no backache Anesthetic complications: no   No complications documented.   Last Vitals: There were no vitals filed for this visit.  Last Pain: There were no vitals filed for this visit.               Tacy Learn

## 2020-04-09 NOTE — Anesthesia Procedure Notes (Signed)
Procedure Name: Intubation Performed by: Tacy Learn, CRNA Pre-anesthesia Checklist: Patient identified, Emergency Drugs available, Suction available, Patient being monitored and Timeout performed Patient Re-evaluated:Patient Re-evaluated prior to induction Oxygen Delivery Method: Circle system utilized Induction Type: IV induction Laryngoscope Size: Miller and 2 Grade View: Grade I Tube size: 7.0 mm Number of attempts: 1 Airway Equipment and Method: Stylet Placement Confirmation: ETT inserted through vocal cords under direct vision,  positive ETCO2,  CO2 detector and breath sounds checked- equal and bilateral Secured at: 21 cm Tube secured with: Tape Dental Injury: Teeth and Oropharynx as per pre-operative assessment

## 2020-04-09 NOTE — Op Note (Signed)
[facility] Operative Note  04/09/20  Preoperative Diagnosis: Ascending colon cancer    Postoperative Diagnosis: Same   Procedure(s) Performed:  Laparoscopic extended right colectomy; extracorporeal primary side to side anastomosis    Surgeon: Lanell Matar. Constance Haw, MD   Assistants: Aviva Signs, MD     Anesthesia: General endotracheal   Anesthesiologist: Louann Sjogren, MD    Specimens:  Right colon   Estimated Blood Loss: Minimal   Blood Replacement: None    Complications: None   Wound Class: Clean Contaminated    Operative Indications:    Findings: Redundant colon, ascending colon cancer palpated and colon opened revealing cancer and additional hepatic flexure polyps that Dr. Chauncy Passy had left    Procedure: The patient was taken to the operating room and placed supine. General endotracheal anesthesia was induced.  A foley catheter was placed and the left arm tucked and all pressure points padded. Intravenous antibiotics were administered per protocol.  An orogastric tube positioned to decompress the stomach. The abdomen was prepared and draped in the usual sterile fashion.  A JCAHO approved timeout was performed.   An incision was made in the infraumbilical area and a towel clip was used to elevate the abdomen and a Veress needle was inserted. Confirmation of intraperitoneal location was done with the saline drop test and low insufflation pressures. A 11 mm trocar was placed under direct visualization with the zero degree scope, and on entry there were no signs of injury from the Veress or trocar.  Additional 5 mm port was placed under direct visualization in the lower abdomen, and 11 mm trocar was placed under direct visualization in the left lower quadrant. The patient was placed in right side up position with some Trendelenburg, and the abdomen was inspected. There were no signs of disease in the peritoneum or liver. There were some adhesions between the liver  and anterior abdominal wall.  There were adhesions from the right colon to the right lateral side wall. The right colon and transverse colon were very redundant with a long mesentery.    Using a combination of scissors and cautery, the adhesion between the cecum and right lateral side were taken down. This was continued up the white line of Toldt to the hepatic flexure. At the hepatic flexure the redundant colon was retracted inferiorly, and the Ligasure was used to take down the hepatic flexure.  Taking care to protect the duodenum inferiorly.  The omentum attached to the colon as divided and included with the specimen. Gentle medial to lateral sweeping was done to mobilize the right colon off the retroperitoneum. The right ureter was identified and protected.  The duodenum was also identified and protected.  The colon was very redundant and would be able to be extracted easily and an extracorporeal anastomosis be performed.  Given the known additional hepatic flexure polyps that were seen on colonoscopy an extended right hemicolectomy would need to be performed.  From here pneumoperitoneum was released, and the infraumbilical incision was extended superiorly.  A small wound protector was placed and the trocars were removed.  The right colon was eviscerated easily into the field.  A 75 mm linear cutting stapler was used to transect the terminal ileum, and the distal point of resection distal to the middle colic for an extended right hemicolectomy.  The mesentery was taken with a Ligasure, and the duodenum was protected the entire time. Sufficient sampling of lymph nodes were obtained by getting as close to the base of  the ileocolic as possible.    The two ends were then placed in anatomic alignment without twisting the bowel.  Towels were placed and enterotomies were made at the antimesenteric border.  Using a 75 mm linear cutting stapler a side to side anastomosis was performed. The common enterotomy was  closed with a TA 60 stapler.  Silk 3-0 sutures were placed in the crotch of the linear stapler, and Lembert 3-0 Silk suture were placed over the TA staple line.  The mesentery was closed with 3-0 Vicryl running suture, and omentum was tacked with a 3-0 Silk suture over the anastomosis for further protection. The bowel was placed back into the abdomen, and irrigation was performed.  The wound protector was removed. Hemostasis was confirmed.   The entire team changed gown and gloves and new closing equipment was used.  The midline extraction site was closed with 0 PDS suture in the standard fashion.  The port sites and midline skin were closed with staples and sterile dressings were applied.   Dr. Arnoldo Morale was assisting throughout the procedure and was present for the critical portions of the case.   Final inspection revealed acceptable hemostasis. All counts were correct at the end of the case. The patient was awakened from anesthesia and extubated without complication.  The patient went to the PACU in stable condition.   Curlene Labrum, MD [facility] [address] [place], [place] [address] [phone] (office)

## 2020-04-10 ENCOUNTER — Encounter (HOSPITAL_COMMUNITY): Payer: Self-pay | Admitting: General Surgery

## 2020-04-10 LAB — MAGNESIUM: Magnesium: 1.7 mg/dL (ref 1.7–2.4)

## 2020-04-10 LAB — CBC WITH DIFFERENTIAL/PLATELET
Abs Immature Granulocytes: 0.02 10*3/uL (ref 0.00–0.07)
Basophils Absolute: 0 10*3/uL (ref 0.0–0.1)
Basophils Relative: 1 %
Eosinophils Absolute: 0 10*3/uL (ref 0.0–0.5)
Eosinophils Relative: 0 %
HCT: 31.5 % — ABNORMAL LOW (ref 36.0–46.0)
Hemoglobin: 10 g/dL — ABNORMAL LOW (ref 12.0–15.0)
Immature Granulocytes: 0 %
Lymphocytes Relative: 28 %
Lymphs Abs: 2.3 10*3/uL (ref 0.7–4.0)
MCH: 29.6 pg (ref 26.0–34.0)
MCHC: 31.7 g/dL (ref 30.0–36.0)
MCV: 93.2 fL (ref 80.0–100.0)
Monocytes Absolute: 0.7 10*3/uL (ref 0.1–1.0)
Monocytes Relative: 8 %
Neutro Abs: 5.1 10*3/uL (ref 1.7–7.7)
Neutrophils Relative %: 63 %
Platelets: 329 10*3/uL (ref 150–400)
RBC: 3.38 MIL/uL — ABNORMAL LOW (ref 3.87–5.11)
RDW: 13.9 % (ref 11.5–15.5)
WBC: 8.2 10*3/uL (ref 4.0–10.5)
nRBC: 0 % (ref 0.0–0.2)

## 2020-04-10 LAB — BASIC METABOLIC PANEL
Anion gap: 8 (ref 5–15)
BUN: 9 mg/dL (ref 8–23)
CO2: 29 mmol/L (ref 22–32)
Calcium: 8.9 mg/dL (ref 8.9–10.3)
Chloride: 100 mmol/L (ref 98–111)
Creatinine, Ser: 0.82 mg/dL (ref 0.44–1.00)
GFR calc Af Amer: >60 mL/min (ref >60)
GFR calc non Af Amer: >60 mL/min (ref >60)
Glucose, Bld: 105 mg/dL — ABNORMAL HIGH (ref 70–99)
Potassium: 3.4 mmol/L — ABNORMAL LOW (ref 3.5–5.1)
Sodium: 137 mmol/L (ref 135–145)

## 2020-04-10 LAB — PHOSPHORUS: Phosphorus: 2.8 mg/dL (ref 2.5–4.6)

## 2020-04-10 MED ORDER — MAGNESIUM SULFATE 2 GM/50ML IV SOLN
2.0000 g | Freq: Once | INTRAVENOUS | Status: AC
  Administered 2020-04-10: 2 g via INTRAVENOUS
  Filled 2020-04-10: qty 50

## 2020-04-10 MED ORDER — POTASSIUM CHLORIDE CRYS ER 20 MEQ PO TBCR
40.0000 meq | EXTENDED_RELEASE_TABLET | Freq: Two times a day (BID) | ORAL | Status: AC
  Administered 2020-04-10 (×2): 40 meq via ORAL
  Filled 2020-04-10 (×2): qty 2

## 2020-04-10 NOTE — Plan of Care (Signed)

## 2020-04-10 NOTE — Progress Notes (Signed)
1 Day Post-Op  Subjective: Patient was interviewed at bedside. Reports that overall her body is doing well and she has been able to walk some since procedure. Mentioned today that her aunt and grandmother (on mother's side) have a history of colon cancer and had to have resections as well. States that the tramadol she received has helped with pain if she has had any, and reports having no pain in the abdominal region. Reports that she will likely get tested for Lynch Syndrome per PCP. She did report today however her mood has "been off" and she has felt sad given everything that has occurred to her.   Objective: Vital signs in last 24 hours: Temp:  [98.3 F (36.8 C)-99.2 F (37.3 C)] 99.2 F (37.3 C) (08/24 0556) Pulse Rate:  [63-88] 81 (08/24 0556) Resp:  [8-20] 20 (08/24 0556) BP: (94-135)/(54-96) 94/69 (08/24 0556) SpO2:  [96 %-100 %] 96 % (08/24 0556) Weight:  [59 kg-65 kg] 65 kg (08/24 0600) Last BM Date: 04/08/20  Intake/Output from previous day: 08/23 0701 - 08/24 0700 In: 3764.2 [P.O.:120; I.V.:3444.2; IV Piggyback:200] Out: 1525 [NOIBB:0488; Blood:50] Intake/Output this shift: No intake/output data recorded.  Physical Exam: General appearance: alert and oriented CV: RRR, no murmurs, rubs, or gallops PULM: CTA B, no wheezing, rhonchi or crackles ABD: Soft, covered with bandages but was not tender to palpation   Lab Results:  Recent Labs    04/10/20 0732  WBC 8.2  HGB 10.0*  HCT 31.5*  PLT 329   BMET Recent Labs    04/10/20 0732  NA 137  K 3.4*  CL 100  CO2 29  GLUCOSE 105*  BUN 9  CREATININE 0.82  CALCIUM 8.9   PT/INR No results for input(s): LABPROT, INR in the last 72 hours.  Studies/Results: No results found.  Anti-infectives: Anti-infectives (From admission, onward)   Start     Dose/Rate Route Frequency Ordered Stop   04/09/20 2000  cefoTEtan (CEFOTAN) 2 g in sodium chloride 0.9 % 100 mL IVPB        2 g 200 mL/hr over 30 Minutes Intravenous  Every 12 hours 04/09/20 1849 04/10/20 1111   04/09/20 0800  cefoTEtan (CEFOTAN) 2 g in sodium chloride 0.9 % 100 mL IVPB        2 g 200 mL/hr over 30 Minutes Intravenous On call to O.R. 04/09/20 0746 04/09/20 0933      Assessment/Plan: s/p Procedure(s): LAPAROSCOPIC PARTIAL COLECTOMY d/c foley  LOS: 1 day   Patient has been stable s/p for a laparoscopic partial colectomy for a ascending colon cancer. Today will plan to remove the foley, continue with a clear diet, team has encouraged ambulation and patient has tolerated well. Patient's was found to be slightly hypokalemic at 3.4 and was replenished accordingly. Continue to monitor patient closely. Can also check Mg2+ tomorrow given the level seen today being borderline at 1.7 and this hypokalemia seen today. Can re-evaluate patient's mood tomorrow as well. Plan: --Foley out --Clear diet --Continue ambulation as able to tolerate --Off monitor --ERAS-multi module pain control --Check CBC w/diff + BMP  --Check Mg2+   Shade Flood 04/10/2020

## 2020-04-11 LAB — BASIC METABOLIC PANEL
Anion gap: 5 (ref 5–15)
BUN: 10 mg/dL (ref 8–23)
CO2: 23 mmol/L (ref 22–32)
Calcium: 7.9 mg/dL — ABNORMAL LOW (ref 8.9–10.3)
Chloride: 108 mmol/L (ref 98–111)
Creatinine, Ser: 0.67 mg/dL (ref 0.44–1.00)
GFR calc Af Amer: >60 mL/min (ref >60)
GFR calc non Af Amer: >60 mL/min (ref >60)
Glucose, Bld: 113 mg/dL — ABNORMAL HIGH (ref 70–99)
Potassium: 4.1 mmol/L (ref 3.5–5.1)
Sodium: 136 mmol/L (ref 135–145)

## 2020-04-11 LAB — CBC WITH DIFFERENTIAL/PLATELET
Abs Immature Granulocytes: 0.02 10*3/uL (ref 0.00–0.07)
Basophils Absolute: 0.1 10*3/uL (ref 0.0–0.1)
Basophils Relative: 1 %
Eosinophils Absolute: 0.1 10*3/uL (ref 0.0–0.5)
Eosinophils Relative: 2 %
HCT: 24.4 % — ABNORMAL LOW (ref 36.0–46.0)
Hemoglobin: 7.8 g/dL — ABNORMAL LOW (ref 12.0–15.0)
Immature Granulocytes: 0 %
Lymphocytes Relative: 32 %
Lymphs Abs: 1.9 10*3/uL (ref 0.7–4.0)
MCH: 30.4 pg (ref 26.0–34.0)
MCHC: 32 g/dL (ref 30.0–36.0)
MCV: 94.9 fL (ref 80.0–100.0)
Monocytes Absolute: 0.4 10*3/uL (ref 0.1–1.0)
Monocytes Relative: 7 %
Neutro Abs: 3.3 10*3/uL (ref 1.7–7.7)
Neutrophils Relative %: 58 %
Platelets: 258 10*3/uL (ref 150–400)
RBC: 2.57 MIL/uL — ABNORMAL LOW (ref 3.87–5.11)
RDW: 13.8 % (ref 11.5–15.5)
WBC: 5.8 10*3/uL (ref 4.0–10.5)
nRBC: 0 % (ref 0.0–0.2)

## 2020-04-11 MED ORDER — IBUPROFEN 800 MG PO TABS
800.0000 mg | ORAL_TABLET | Freq: Four times a day (QID) | ORAL | Status: DC
  Administered 2020-04-11 – 2020-04-12 (×4): 800 mg via ORAL
  Filled 2020-04-11 (×4): qty 1

## 2020-04-11 NOTE — Progress Notes (Addendum)
2 Days Post-Op   I was present with the medical student for this service. I personally verified the history of present illness, performed the physical exam, and made the plan for this encounter. I have verified the medical student's documentation and made modifications where appropriately. I have personally documented in my own words a brief history, physical, and plan below.     Looking great. Walking halls and has on clothes. Says she is not having pain. No narcotics taken yesterday. Had BM that was bloody and last was not. H&H has drifted.  Soft, nondistended, honeycomb in place and no erythema or staining MAE Normal work breathing  Ibuprofen in place of toradol now Continue tylenol scheduled too Soft diet Repeat labs in AM, H&H drifting and had some bloody BM but no active signs of bleeding at this time, will hold AM lovenox tomorrow until we get the labs back  SCDs, lovenox  Curlene Labrum, MD [facility] [place], [place] [address] [phone] (office)    Subjective: Patient was sitting comfortably in chair crocheting beside window. States she is still sad, hungry and really wants food. Has tolerated liquids well, has had plenty of chicken broth she reports. Had 2 cups of juice, 2 cups of coffee, and 1/2 a coke since yesterday. States she ambulates regularly around the room and demonstrated it to me. Reports taking TUMS regularly for the "acid" and has taken 1-2 since yesterday. Showed me her incentive spirometry and it was at 2000 mL and she blew it again to demonstrate, states that she uses it regularly throughout the day. Denies N/V and dizziness.   Objective: Vital signs in last 24 hours: Temp:  [98.6 F (37 C)-98.9 F (37.2 C)] 98.8 F (37.1 C) (08/25 0526) Pulse Rate:  [69-78] 78 (08/25 0526) Resp:  [16-20] 18 (08/25 0526) BP: (105-120)/(62-70) 116/70 (08/25 0526) SpO2:  [95 %-100 %] 98 % (08/25 0526) Weight:  [64.5 kg]  64.5 kg (08/25 0526) Last BM Date: 04/08/20  Intake/Output from previous day: 08/24 0701 - 08/25 0700 In: -  Out: 1 [Urine:1] Intake/Output this shift: No intake/output data recorded.  General appearance: alert, cooperative and no distress Resp: clear to auscultation bilaterally Cardio: regular rate and rhythm, S1, S2 normal, no murmur, click, rub or gallop GI: Soft, no tenderness on palpation, area covered with bandages  Lab Results:  Recent Labs    04/10/20 0732 04/11/20 0628  WBC 8.2 5.8  HGB 10.0* 7.8*  HCT 31.5* 24.4*  PLT 329 258   BMET Recent Labs    04/10/20 0732 04/11/20 0628  NA 137 136  K 3.4* 4.1  CL 100 108  CO2 29 23  GLUCOSE 105* 113*  BUN 9 10  CREATININE 0.82 0.67  CALCIUM 8.9 7.9*   PT/INR No results for input(s): LABPROT, INR in the last 72 hours.  Studies/Results: No results found.  Anti-infectives: Anti-infectives (From admission, onward)   Start     Dose/Rate Route Frequency Ordered Stop   04/09/20 2000  cefoTEtan (CEFOTAN) 2 g in sodium chloride 0.9 % 100 mL IVPB        2 g 200 mL/hr over 30 Minutes Intravenous Every 12 hours 04/09/20 1849 04/10/20 1111   04/09/20 0800  cefoTEtan (CEFOTAN) 2 g in sodium chloride 0.9 % 100 mL IVPB        2 g 200 mL/hr over 30 Minutes Intravenous On call to O.R. 04/09/20 0737 04/09/20 0933      Assessment/Plan: s/p  Procedure(s): LAPAROSCOPIC PARTIAL COLECTOMY Advance diet  LOS: 2 days  Remains stable s/p laparoscopic partial colectomy. Hb levels have decreased, 10.0-->7.8, likely dilutional due to IVF, no clear signs of bleeding and asymptomatic at this time. Continue monitoring daily labs. Tolerated liquids well, anticipate advancing diet to soft foods tomorrow.  Plan: --Continue ambulation --Pain control on ERAS --SCDs, levenox --Check CBC w/diff + BMP in AM --Advance diet orders for tomorrow   Shade Flood 04/11/2020

## 2020-04-11 NOTE — Plan of Care (Signed)

## 2020-04-12 DIAGNOSIS — D62 Acute posthemorrhagic anemia: Secondary | ICD-10-CM

## 2020-04-12 LAB — BASIC METABOLIC PANEL
Anion gap: 6 (ref 5–15)
BUN: 9 mg/dL (ref 8–23)
CO2: 24 mmol/L (ref 22–32)
Calcium: 8.2 mg/dL — ABNORMAL LOW (ref 8.9–10.3)
Chloride: 107 mmol/L (ref 98–111)
Creatinine, Ser: 0.57 mg/dL (ref 0.44–1.00)
GFR calc Af Amer: >60 mL/min (ref >60)
GFR calc non Af Amer: >60 mL/min (ref >60)
Glucose, Bld: 88 mg/dL (ref 70–99)
Potassium: 3.9 mmol/L (ref 3.5–5.1)
Sodium: 137 mmol/L (ref 135–145)

## 2020-04-12 LAB — CBC WITH DIFFERENTIAL/PLATELET
Abs Immature Granulocytes: 0.01 10*3/uL (ref 0.00–0.07)
Basophils Absolute: 0.1 10*3/uL (ref 0.0–0.1)
Basophils Relative: 1 %
Eosinophils Absolute: 0.6 10*3/uL — ABNORMAL HIGH (ref 0.0–0.5)
Eosinophils Relative: 10 %
HCT: 22.8 % — ABNORMAL LOW (ref 36.0–46.0)
Hemoglobin: 7.2 g/dL — ABNORMAL LOW (ref 12.0–15.0)
Immature Granulocytes: 0 %
Lymphocytes Relative: 30 %
Lymphs Abs: 1.7 10*3/uL (ref 0.7–4.0)
MCH: 29.8 pg (ref 26.0–34.0)
MCHC: 31.6 g/dL (ref 30.0–36.0)
MCV: 94.2 fL (ref 80.0–100.0)
Monocytes Absolute: 0.4 10*3/uL (ref 0.1–1.0)
Monocytes Relative: 7 %
Neutro Abs: 2.8 10*3/uL (ref 1.7–7.7)
Neutrophils Relative %: 52 %
Platelets: 265 10*3/uL (ref 150–400)
RBC: 2.42 MIL/uL — ABNORMAL LOW (ref 3.87–5.11)
RDW: 13.6 % (ref 11.5–15.5)
WBC: 5.5 10*3/uL (ref 4.0–10.5)
nRBC: 0 % (ref 0.0–0.2)

## 2020-04-12 LAB — HEMOGLOBIN AND HEMATOCRIT, BLOOD
HCT: 23.7 % — ABNORMAL LOW (ref 36.0–46.0)
Hemoglobin: 7.7 g/dL — ABNORMAL LOW (ref 12.0–15.0)

## 2020-04-12 LAB — MAGNESIUM: Magnesium: 1.8 mg/dL (ref 1.7–2.4)

## 2020-04-12 MED ORDER — DOCUSATE SODIUM 100 MG PO CAPS: 100.0000 mg | ORAL_CAPSULE | Freq: Two times a day (BID) | ORAL | 2 refills | Status: DC | PRN

## 2020-04-12 MED ORDER — OXYCODONE HCL 5 MG PO TABS: 5.0000 mg | ORAL_TABLET | ORAL | 0 refills | Status: DC | PRN

## 2020-04-12 MED ORDER — ONDANSETRON HCL 4 MG PO TABS
4.0000 mg | ORAL_TABLET | Freq: Four times a day (QID) | ORAL | 0 refills | Status: DC | PRN
Start: 2020-04-12 — End: 2020-04-25

## 2020-04-12 NOTE — Progress Notes (Addendum)
3 Days Post-Op   I was present with the medical student for this service. I personally verified the history of present illness, performed the physical exam, and made the plan for this encounter. I have verified the medical student's documentation and made modifications where appropriately. I have personally documented in my own words a brief history, physical, and plan below.     Doing well. Tolerating diet. Having Bms. Pain controlled. H&H Stable on repeat. See discharge summary.  Curlene Labrum, MD [facility] [place], [place] [address] [address] [phone] (office)    Subjective: Patient interviewed and was sitting comfortably in chair. Reports minor pain in the lower middle quadrant. Has used Tyelnol as needed. Has not passed gas. Has reported quite a few BMs, stating they had to change the bedside commode once or twice. Stated yesterday there was a tinge of blood in her stools but not as bad as before. Reports no blood today. This morning she reported mild diarrhea. Reports clear UOP. Patient states she ate a full meal yesterday of real food, consisting of pot roast and other soft food items. This morning she was about to eat her eggs, toast, and other soft food items. Has continued to ambulate well. Was in good spirits today.   Objective: Vital signs in last 24 hours: Temp:  [98.7 F (37.1 C)] 98.7 F (37.1 C) (08/26 0443) Pulse Rate:  [83-94] 83 (08/26 0443) Resp:  [16] 16 (08/26 0443) BP: (101-108)/(56-67) 101/56 (08/26 0443) SpO2:  [98 %-99 %] 99 % (08/26 0443) Weight:  [65 kg] 65 kg (08/26 0500) Last BM Date: 04/11/20  Intake/Output from previous day: 08/25 0701 - 08/26 0700 In: 885 [P.O.:560] Out: 2050 [Urine:350; Stool:1700] Intake/Output this shift: No intake/output data recorded.  Physical Exam: GEN: NAD, sitting comfortably in chair  CV: RRR, no murmurs, rubs, or gallops PULM: CTA B, no wheezing, rhonchi or crackles ABD:  Soft, mildly tender to palpation in the lower middle quandrant, and had bandages wrapped around   Lab Results:  Recent Labs    04/10/20 0732 04/11/20 0628  WBC 8.2 5.8  HGB 10.0* 7.8*  HCT 31.5* 24.4*  PLT 329 258   BMET Recent Labs    04/10/20 0732 04/11/20 0628  NA 137 136  K 3.4* 4.1  CL 100 108  CO2 29 23  GLUCOSE 105* 113*  BUN 9 10  CREATININE 0.82 0.67  CALCIUM 8.9 7.9*   PT/INR No results for input(s): LABPROT, INR in the last 72 hours.  Studies/Results: No results found.  Anti-infectives: Anti-infectives (From admission, onward)   Start     Dose/Rate Route Frequency Ordered Stop   04/09/20 2000  cefoTEtan (CEFOTAN) 2 g in sodium chloride 0.9 % 100 mL IVPB        2 g 200 mL/hr over 30 Minutes Intravenous Every 12 hours 04/09/20 1849 04/10/20 1111   04/09/20 0800  cefoTEtan (CEFOTAN) 2 g in sodium chloride 0.9 % 100 mL IVPB        2 g 200 mL/hr over 30 Minutes Intravenous On call to O.R. 04/09/20 0746 04/09/20 0933        Assessment/Plan: s/p Procedure(s): LAPAROSCOPIC PARTIAL COLECTOMY Advance diet Plan for discharge tomorrow  LOS: 3 days  Remains stable s/p laparoscopic partial colectomy. Continue monitoring daily labs. Most recent Hb levels continues downtrend 10-->7.8->now 7.2. Currently no active signs of overt bleeding, this may suggest internal blood loss based off of stool findings. Could be dilutional because of IVF  or potentially from the lovenox she was recieving. Will hold lovenox for the time being and recheck labs tomorrow. Has tolerated soft foods well, can anticipate discharge for tomorrow or Saturday if no other issues present.  Plan: --Hold lovenox --Continue ambulation  --Continue tylenol as scheduled --Check labs CBC + BMP in AM  --Continue soft diet  Shade Flood 04/12/2020

## 2020-04-12 NOTE — Discharge Instructions (Signed)
Discharge Abdominal Surgery Instructions:  Common Complaints: Pain at the incision site is common. This will improve with time. Take your pain medications as described below. Some nausea is common and poor appetite. The main goal is to stay hydrated the first few days after surgery.   Diet/ Activity: Take a multivitamin that has iron in it for your anemia. Do not start full iron supplementation yet due to risk of constipation. We will plan to recheck your blood counts in a few weeks.  Diet as tolerated. You have started and tolerated a diet in the hospital, and should continue to increase what you are able to eat.   You may not have a large appetite, but it is important to stay hydrated. Drink 64 ounces of water a day. Your appetite will return with time.  Keep a dry dressing in place over your staples daily or as needed. Some minor pink/ blood tinged drainage is expected. This will stop in a few days after surgery.  Shower per your regular routine daily.  Do not take hot showers. Take warm showers that are less than 10 minutes. Pat the incision dry. Wear an abdominal binder daily with activity. You do not have to wear this while sleeping or sitting.  Rest and listen to your body, but do not remain in bed all day.  Walk everyday for at least 15-20 minutes. Deep cough and move around every 1-2 hours in the first few days after surgery.  Do not lift > 10 lbs, perform excessive bending, pushing, pulling, squatting for 6-8 weeks after surgery.  The activity restrictions and the abdominal binder are to prevent hernia formation at your incision while you are healing.  Do not place lotions or balms on your incision unless instructed to specifically by Dr. Constance Haw.   Pain Expectations and Narcotics: -After surgery you will have pain associated with your incisions and this is normal. The pain is muscular and nerve pain, and will get better with time. -You are encouraged and expected to take non narcotic  medications like tylenol and ibuprofen (when able) to treat pain as multiple modalities can aid with pain treatment. -Narcotics are only used when pain is severe or there is breakthrough pain. -You are not expected to have a pain score of 0 after surgery, as we cannot prevent pain. A pain score of 3-4 that allows you to be functional, move, walk, and tolerate some activity is the goal. The pain will continue to improve over the days after surgery and is dependent on your surgery. -Due to [place] law, we are only able to give a certain amount of pain medication to treat post operative pain, and we only give additional narcotics on a patient by patient basis.  -For most laparoscopic surgery, studies have shown that the majority of patients only need 10-15 narcotic pills, and for open surgeries most patients only need 15-20.   -Having appropriate expectations of pain and knowledge of pain management with non narcotics is important as we do not want anyone to become addicted to narcotic pain medication.  -Using ice packs in the first 48 hours and heating pads after 48 hours, wearing an abdominal binder (when recommended), and using over the counter medications are all ways to help with pain management.   -Simple acts like meditation and mindfulness practices after surgery can also help with pain control and research has proven the benefit of these practices.  Medication: Take tylenol and ibuprofen as needed for pain control, alternating every  4-6 hours.  Example:  Tylenol 1080m @ 6am, 12noon, 6pm, 139mnight (Do not exceed 400067mf tylenol a day). Ibuprofen 800m76m9am, 3pm, 9pm, 3am (Do not exceed 3600mg57mibuprofen a day).  Take Roxicodone for breakthrough pain every 4 hours.  Take Colace for constipation related to narcotic pain medication. If you do not have a bowel movement in 2 days, take Miralax over the counter.  Drink plenty of water to also prevent constipation.   Contact Information: If you  have questions or concerns, please call our office, [phone]- Thursday 8AM-5PM and Friday 8AM-12Noon.  If it is after hours or on the weekend, please call [facility] Main [facility], [phone] ask to speak to the surgeon on call for Dr. BridgConstance HawnnieMountain [facility] Surgery CenterAnemia  Anemia is a condition in which you do not have enough red blood cells or hemoglobin. Hemoglobin is a substance in red blood cells that carries oxygen. When you do not have enough red blood cells or hemoglobin (are anemic), your body cannot get enough oxygen and your organs may not work properly. As a result, you may feel very tired or have other problems. What are the causes? Common causes of anemia include:  Excessive bleeding. Anemia can be caused by excessive bleeding inside or outside the body, including bleeding from the intestine or from periods in women.  POST OPERATIVE ANEMIA   Poor nutrition.  Long-lasting (chronic) kidney, thyroid, and liver disease.  Bone marrow disorders.  Cancer and treatments for cancer.  HIV (human immunodeficiency virus) and AIDS (acquired immunodeficiency syndrome).  Treatments for HIV and AIDS.  Spleen problems.  Blood disorders.  Infections, medicines, and autoimmune disorders that destroy red blood cells. What are the signs or symptoms? Symptoms of this condition include:  Minor weakness.  Dizziness.  Headache.  Feeling heartbeats that are irregular or faster than normal (palpitations).  Shortness of breath, especially with exercise.  Paleness.  Cold sensitivity.  Indigestion.  Nausea.  Difficulty sleeping.  Difficulty concentrating. Symptoms may occur suddenly or develop slowly. If your anemia is mild, you may not have symptoms. How is this diagnosed? This condition is diagnosed based on:  Blood tests.  Your medical history.  A physical exam.  Bone marrow biopsy. Your health care provider may also check your stool (feces) for blood and  may do additional testing to look for the cause of your bleeding. You may also have other tests, including:  Imaging tests, such as a CT scan or MRI.  Endoscopy.  Colonoscopy. How is this treated? Treatment for this condition depends on the cause. If you continue to lose a lot of blood, you may need to be treated at a hospital. Treatment may include:  Taking supplements of iron, vitamin B12, Q76folic acid.  Taking a hormone medicine (erythropoietin) that can help to stimulate red blood cell growth.  Having a blood transfusion. This may be needed if you lose a lot of blood.  Making changes to your diet.  Having surgery to remove your spleen. Follow these instructions at home:  Take over-the-counter and prescription medicines only as told by your health care provider.  Take supplements only as told by your health care provider.  Follow any diet instructions that you were given.  Keep all follow-up visits as told by your health care provider. This is important. Contact a health care provider if:  You develop new bleeding anywhere in the body. Get help right away if:  You are very  weak.  You are short of breath.  You have pain in your abdomen or chest.  You are dizzy or feel faint.  You have trouble concentrating.  You have bloody or black, tarry stools.  You vomit repeatedly or you vomit up blood. Summary  Anemia is a condition in which you do not have enough red blood cells or enough of a substance in your red blood cells that carries oxygen (hemoglobin).  Symptoms may occur suddenly or develop slowly.  If your anemia is mild, you may not have symptoms.  This condition is diagnosed with blood tests as well as a medical history and physical exam. Other tests may be needed.  Treatment for this condition depends on the cause of the anemia. This information is not intended to replace advice given to you by your health care provider. Make sure you discuss any  questions you have with your health care provider. Document Revised: 07/17/2017 Document Reviewed: 09/05/2016 Elsevier Patient Education  [place].  Laparoscopic Colectomy, Care After This sheet gives you information about how to care for yourself after your procedure. Your health care provider may also give you more specific instructions. If you have problems or questions, contact your health care provider. What can I expect after the procedure? After your procedure, it is common to have the following:  Pain in your abdomen, especially in the incision areas. You will be given medicine to control the pain.  Tiredness. This is a normal part of the recovery process. Your energy level will return to normal over the next several weeks.  Changes in your bowel movements, such as constipation or needing to go more often. Talk with your health care provider about how to manage this. Follow these instructions at home: Medicines  Take over-the-counter and prescription medicines only as told by your health care provider.  Do not drive or use heavy machinery while taking prescription pain medicine.  Do not drink alcohol while taking prescription pain medicine.  If you were prescribed an antibiotic medicine, use it as told by your health care provider. Do not stop using the antibiotic even if you start to feel better. Incision care   Follow instructions from your health care provider about how to take care of your incision areas. Make sure you: ? Keep your incisions clean and dry. ? Wash your hands with soap and water before and after applying medicine to the areas, and before and after changing your bandage (dressing). If soap and water are not available, use hand sanitizer. ? Change your dressing as told by your health care provider. ? Leave stitches (sutures), skin glue, or adhesive strips in place. These skin closures may need to stay in place for 2 weeks or longer. If adhesive strip  edges start to loosen and curl up, you may trim the loose edges. Do not remove adhesive strips completely unless your health care provider tells you to do that.  Do not wear tight clothing over the incisions. Tight clothing may rub and irritate the incision areas, which may cause the incisions to open.  Do not take baths, swim, or use a hot tub until your health care provider approves. Ask your health care provider if you can take showers. You may only be allowed to take sponge baths for bathing.  Check your incision area every day for signs of infection. Check for: ? More redness, swelling, or pain. ? More fluid or blood. ? Warmth. ? Pus or a bad smell. Activity  Avoid lifting anything that is heavier than 10 lb (4.5 kg) until your health care provider says it is okay.  You may resume normal activities as told by your health care provider. Ask your health care provider what activities are safe for you.  Take rest breaks during the day as needed. Eating and drinking  Follow instructions from your health care provider about what you can eat after surgery.  To prevent or treat constipation while you are taking prescription pain medicine, your health care provider may recommend that you: ? Drink enough fluid to keep your urine clear or pale yellow. ? Take over-the-counter or prescription medicines. ? Eat foods that are high in fiber, such as fresh fruits and vegetables, whole grains, and beans. ? Limit foods that are high in fat and processed sugars, such as fried and sweet foods. General instructions  Ask your health care provider when you will need an appointment to get your sutures or staples removed.  Keep all follow-up visits as told by your health care provider. This is important. Contact a health care provider if:  You have more redness, swelling, or pain around your incisions.  You have more fluid or blood coming from the incisions.  Your incisions feel warm to the  touch.  You have pus or a bad smell coming from your incisions or your dressing.  You have a fever.  You have an incision that breaks open (edges not staying together) after sutures or staples have been removed. Get help right away if:  You develop a rash.  You have chest pain or difficulty breathing.  You have pain or swelling in your legs.  You feel light-headed or you faint.  Your abdomen swells (becomes distended).  You have nausea or vomiting.  You have blood in your stool (feces). This information is not intended to replace advice given to you by your health care provider. Make sure you discuss any questions you have with your health care provider. Document Revised: 04/23/2018 Document Reviewed: 05/05/2016 Elsevier Patient Education  [place].

## 2020-04-12 NOTE — Discharge Summary (Signed)
Physician Discharge Summary  Patient ID: Sara Coffey MRN: [id number] DOB/AGE: [id number] 67 y.o.  Admit date: 04/09/2020 Discharge date: 04/12/2020  Admission Diagnoses: Ascending colon cancer   Discharge Diagnoses:  Principal Problem:   Malignant neoplasm of ascending colon ([place]) Active Problems:   Cancer of ascending colon ([place])   Postoperative anemia due to acute blood loss   Discharged Condition: good  Hospital Course:  Sara Coffey is a 69 yo with a history of ascending colon cancer who came to get a laparoscopic right hemicolectomy. She did well post op and had her pain controlled with ERAS protocol. She was ambulating, tolerating a diet, and had good urine output. She was able to tolerate a diet and did have some bowel movements. Her first BMs were bloody and her H&H did drop including acute post operative anemia. This had stabilized out with three H&H that were in the 7.2-7.8 range. She had no further bloody stools and had no complaints of dizziness or weakness at discharge. I recommended multivitamin to help with post operative anemia due to risk of constipation with iron supplementation at this time.   Consults: None  Significant Diagnostic Studies:  Results for Sara Coffey (MRN [id number]) as of 04/12/2020 15:55  Ref. Range 04/11/2020 06:28 04/12/2020 07:12 04/12/2020 13:45  Sodium Latest Ref Range: 135 - 145 mmol/L 136 137   Potassium Latest Ref Range: 3.5 - 5.1 mmol/L 4.1 3.9   Chloride Latest Ref Range: 98 - 111 mmol/L 108 107   CO2 Latest Ref Range: 22 - 32 mmol/L 23 24   Glucose Latest Ref Range: 70 - 99 mg/dL 113 (H) 88   BUN Latest Ref Range: 8 - 23 mg/dL 10 9   Creatinine Latest Ref Range: 0.44 - 1.00 mg/dL 0.67 0.57   Calcium Latest Ref Range: 8.9 - 10.3 mg/dL 7.9 (L) 8.2 (L)   Anion gap Latest Ref Range: 5 - '15  5 6   ' Magnesium Latest Ref Range: 1.7 - 2.4 mg/dL  1.8   GFR, Est Non African American Latest Ref Range: >60 mL/min >60 >60   GFR, Est African  American Latest Ref Range: >60 mL/min >60 >60   WBC Latest Ref Range: 4.0 - 10.5 K/uL 5.8 5.5   RBC Latest Ref Range: 3.87 - 5.11 MIL/uL 2.57 (L) 2.42 (L)   Hemoglobin Latest Ref Range: 12.0 - 15.0 g/dL 7.8 (L) 7.2 (L) 7.7 (L)  HCT Latest Ref Range: 36 - 46 % 24.4 (L) 22.8 (L) 23.7 (L)  MCV Latest Ref Range: 80.0 - 100.0 fL 94.9 94.2   MCH Latest Ref Range: 26.0 - 34.0 pg 30.4 29.8   MCHC Latest Ref Range: 30.0 - 36.0 g/dL 32.0 31.6   RDW Latest Ref Range: 11.5 - 15.5 % 13.8 13.6   Platelets Latest Ref Range: 150 - 400 K/uL 258 265   nRBC Latest Ref Range: 0.0 - 0.2 % 0.0 0.0    Treatments: Laparoscopic extended right hemicolectomy 8/223/2021   Pathology: FINAL MICROSCOPIC DIAGNOSIS:   A. RIGHT COLON, RESECTION:  - Adenocarcinoma, moderately differentiated, 3.5 cm  - Separate focus of invasive adenocarcinoma, 0.4 cm, arising in a  tubular adenoma with high-grade dysplasia  - No carcinoma identified in thirty-two lymph nodes (0/32)  - Sessile serrated polyp(s)  - Benign appendix  - Margins uninvolved by carcinoma  - See oncology table and comment below   ONCOLOGY TABLE:    COLON AND RECTUM: Resection, Including Transanal Disk Excision of  Rectal Neoplasms  Procedure: Right hemicolectomy  Tumor Site: Ascending colon  Tumor Size: 3.5 cm  Multiple primary sites: Present  Macroscopic Tumor Perforation: Not identified  Histologic Type: Adenocarcinoma  Histologic Grade: G2, moderately differentiated  Tumor Extension: Invades through muscularis propria into pericolorectal  tissue  Margins: Uninvolved by tumor  Treatment Effect: No known presurgical therapy  Lymphovascular Invasion: Not identified  Perineural Invasion: Not identified  Tumor Deposits: Not identified  Regional Lymph Nodes:    Number of Lymph Nodes Involved: 0    Number of Lymph Nodes Examined: 32  Pathologic Stage Classification (pTNM, AJCC 8th Edition): mpT3, pN0  Ancillary Studies: MMR / MSI  testing will be ordered.  Representative Tumor Block: A3  Comments: There is a separate focus of invasive carcinoma arising in a  tubular adenoma with high-grade dysplasia (0.4 cm).   (v4.2.0.0)   GROSS DESCRIPTION:   Specimen: Right colon segment including attached appendix and portion of  terminal ileum, received in formalin.  Specimen integrity: Previously opened by clinician  Specimen length: There are 5 cm of terminal ileum and right colon is 32  cm from cecum to distal margin.  Mesorectal intactness: Not applicable  Tumor location: Proximal ascending colon, predominantly lateral wall.  Tumor size: 3.5 cm in length (base of 3 cm in length) and 4 cm in width  tan-pink firm sessile mass with fungating periphery. Mass is up to 1.1  cm thick.  Percent of bowel circumference involved: 50%  Tumor distance to margins:            Proximal: 6 cm            Distal: 23 cm            Radial (posterior ascending): 0.3 cm to the  pericolonic soft tissue margin (margin inked black).  Macroscopic extent of tumor invasion: The tumor involves full-thickness  of the wall.  Total presumed lymph nodes: Found are 34 possible lymph nodes which  range from 0.2 to 1.1 cm.  Extramural satellite tumor nodules: None  Mucosal polyp(s): There are several tan-pink sessile polyps scattered  throughout the remaining ascending colon, which are up to 2.5 cm in  greatest dimension.  Additional findings: The appendix is 4.8 cm in length, ranges in  diameter from 0.5 to 0.8 cm, is a tan-pink smooth serosa and  unremarkable cut surfaces.  Block summary:  Block 1 = proximal margin  Block 2 = distal margin  Blocks 3-5 = mass including nearest radial margin (margin inked black)  Block 6 = section of mass and possible nearest serosa  Block 7 = tissue for molecular testing  Blocks 8, 9 = largest of additional polyps in ascending colon  Blocks 10-12 = sections of additional  polyps in ascending colon  Block 13 = appendix  Block 14 = 5 possible nodes  Block 15 = 5 possible nodes  Block 16 = 4 possible nodes  Block 17 = 4 possible nodes  Block 18 = 4 possible nodes  Block 19 = 4 possible nodes  Block 20 = 4 possible nodes  Block 21 = 4 possible nodes   Discharge Exam: Blood pressure (!) 101/56, pulse 83, temperature 98.7 F (37.1 C), temperature source Oral, resp. rate 16, height '5\' 4"'  (1.626 m), weight 65 kg, SpO2 99 %. General appearance: alert, cooperative and no distress Resp: normal work of breathing GI: soft, nondistended, appropriately tender, midline site with bruising, staples in place, no erythema or drainage, port sites with staples and no  erythema or drainage Extremities: extremities normal, atraumatic, no cyanosis or edema  Disposition: Discharge disposition: 01-Home or Self Care       Discharge Instructions    Call MD for:  difficulty breathing, headache or visual disturbances   Complete by: As directed    Call MD for:  extreme fatigue   Complete by: As directed    Call MD for:  persistant dizziness or light-headedness   Complete by: As directed    Call MD for:  persistant nausea and vomiting   Complete by: As directed    Call MD for:  redness, tenderness, or signs of infection (pain, swelling, redness, odor or green/yellow discharge around incision site)   Complete by: As directed    Call MD for:  severe uncontrolled pain   Complete by: As directed    Call MD for:  temperature >100.4   Complete by: As directed    Increase activity slowly   Complete by: As directed      Allergies as of 04/12/2020   No Known Allergies     Medication List    TAKE these medications   acetaminophen 500 MG tablet Commonly known as: TYLENOL Take 1,000 mg by mouth every 6 (six) hours as needed for headache.   calcium carbonate 500 MG chewable tablet Commonly known as: TUMS - dosed in mg elemental calcium Chew 1 tablet by mouth as needed for  indigestion or heartburn.   Calcium-Vitamin D 600-400 MG-UNIT Tabs Take 1 tablet by mouth daily.   docusate sodium 100 MG capsule Commonly known as: Colace Take 1 capsule (100 mg total) by mouth 2 (two) times daily as needed for mild constipation.   Fish Oil 1000 MG Caps Take 2,000 mg by mouth in the morning and at bedtime.   melatonin 5 MG Tabs Take 5 mg by mouth at bedtime as needed (sleep).   ondansetron 4 MG tablet Commonly known as: ZOFRAN Take 1 tablet (4 mg total) by mouth every 6 (six) hours as needed for nausea.   oxyCODONE 5 MG immediate release tablet Commonly known as: Oxy IR/ROXICODONE Take 1 tablet (5 mg total) by mouth every 4 (four) hours as needed for severe pain or breakthrough pain.   rosuvastatin 5 MG tablet Commonly known as: Crestor Take 1 tablet (5 mg total) by mouth daily.       Follow-up Information    Virl Cagey, MD Follow up on 04/19/2020.   Specialty: General Surgery Why: staple removal and check up  Contact information: [address] [place] [address] [phone]               Signed: Virl Cagey 04/12/2020, 3:58 PM

## 2020-04-19 ENCOUNTER — Other Ambulatory Visit (HOSPITAL_COMMUNITY)
Admission: RE | Admit: 2020-04-19 | Discharge: 2020-04-19 | Disposition: A | Discharge status: Home / Self Care | Payer: Medicare HMO | Source: Ambulatory Visit | Attending: General Surgery | Admitting: General Surgery

## 2020-04-19 ENCOUNTER — Encounter: Payer: Self-pay | Admitting: General Surgery

## 2020-04-19 ENCOUNTER — Ambulatory Visit (INDEPENDENT_AMBULATORY_CARE_PROVIDER_SITE_OTHER): Payer: Medicare HMO | Admitting: General Surgery

## 2020-04-19 ENCOUNTER — Other Ambulatory Visit: Payer: Self-pay

## 2020-04-19 VITALS — BP 111/72 | HR 79 | Temp 98.8°F | Resp 16 | Ht 64.0 in | Wt 129.0 lb

## 2020-04-19 DIAGNOSIS — D62 Acute posthemorrhagic anemia: Secondary | ICD-10-CM

## 2020-04-19 DIAGNOSIS — C182 Malignant neoplasm of ascending colon: Secondary | ICD-10-CM

## 2020-04-19 LAB — CBC WITH DIFFERENTIAL/PLATELET
Abs Immature Granulocytes: 0.02 10*3/uL (ref 0.00–0.07)
Basophils Absolute: 0.1 10*3/uL (ref 0.0–0.1)
Basophils Relative: 1 %
Eosinophils Absolute: 0.4 10*3/uL (ref 0.0–0.5)
Eosinophils Relative: 6 %
HCT: 25.1 % — ABNORMAL LOW (ref 36.0–46.0)
Hemoglobin: 7.9 g/dL — ABNORMAL LOW (ref 12.0–15.0)
Immature Granulocytes: 0 %
Lymphocytes Relative: 37 %
Lymphs Abs: 2.4 10*3/uL (ref 0.7–4.0)
MCH: 29.9 pg (ref 26.0–34.0)
MCHC: 31.5 g/dL (ref 30.0–36.0)
MCV: 95.1 fL (ref 80.0–100.0)
Monocytes Absolute: 0.4 10*3/uL (ref 0.1–1.0)
Monocytes Relative: 6 %
Neutro Abs: 3.1 10*3/uL (ref 1.7–7.7)
Neutrophils Relative %: 50 %
Platelets: 523 10*3/uL — ABNORMAL HIGH (ref 150–400)
RBC: 2.64 MIL/uL — ABNORMAL LOW (ref 3.87–5.11)
RDW: 13.8 % (ref 11.5–15.5)
WBC: 6.3 10*3/uL (ref 4.0–10.5)
nRBC: 0 % (ref 0.0–0.2)

## 2020-04-19 NOTE — Progress Notes (Signed)
[facility] Note   HPI:  69 y.o. Female presents to clinic for post-op follow-up evaluation after laparoscopic right extended colectomy. Doing well and feeling good.   Review of Systems:  Tolerating diet Over counter for pain control Regular Bms Some minor dizziness with standing  All other review of systems: otherwise negative   Vital Signs:  BP 111/72   Pulse 79   Temp 98.8 F (37.1 C) (Oral)   Resp 16   Ht 5\' 4"  (1.626 m)   Wt 129 lb (58.5 kg)   SpO2 99%   BMI 22.14 kg/m    Physical Exam:  Physical Exam Vitals reviewed.  Cardiovascular:     Rate and Rhythm: Normal rate.  Pulmonary:     Effort: Pulmonary effort is normal.  Abdominal:     General: There is no distension.     Palpations: Abdomen is soft.     Tenderness: There is no abdominal tenderness.     Hernia: No hernia is present.     Comments: Staples removed from midline extraction site and port sites, steri strips placed, no erythema or drainage.   Skin:    General: Skin is warm.  Neurological:     General: No focal deficit present.     Mental Status: She is alert and oriented to person, place, and time.     Laboratory studies: CBC    Component Value Date/Time   WBC 6.3 04/19/2020 1429   RBC 2.64 (L) 04/19/2020 1429   HGB 7.9 (L) 04/19/2020 1429   HCT 25.1 (L) 04/19/2020 1429   PLT 523 (H) 04/19/2020 1429   MCV 95.1 04/19/2020 1429   MCH 29.9 04/19/2020 1429   MCHC 31.5 04/19/2020 1429   RDW 13.8 04/19/2020 1429   LYMPHSABS 2.4 04/19/2020 1429   MONOABS 0.4 04/19/2020 1429   EOSABS 0.4 04/19/2020 1429   BASOSABS 0.1 04/19/2020 1429     Assessment:  69 y.o. yo Female s/p laparoscopic extended right hemicolectomy. Doing well. Had post operative anemia and repeat labs improving slightly.   Plan:  - Keep stool regular  - Ok to start on some over the counter iron and vitamin c for anemia, make sure not to get constipated  - No heavy lifting > 10 lbs, excessive bending,  pushing, pulling, or squatting for 6-8 weeks after surgery.  - Seeing oncology to discuss cancer and results of path  Future Appointments  Date Time Provider [place]  05/24/2020  1:00 PM Virl Cagey, MD [facility] None  09/19/2020 11:15 AM Annie Main, FNP [facility] None    All of the above recommendations were discussed with the patient and patient's family, and all of patient's and family's questions were answered to their expressed satisfaction.  Curlene Labrum, MD [facility] [address] [place], [place] [address] [phone] (office)

## 2020-04-19 NOTE — Patient Instructions (Signed)
Diet as tolerated.  No heavy lifting > 10 lbs, excessive bending, pushing, pulling, or squatting for 6-8 weeks after surgery.   Referral to Oncology. CBC to check your blood counts.

## 2020-04-24 ENCOUNTER — Encounter (HOSPITAL_COMMUNITY): Payer: Self-pay

## 2020-04-25 ENCOUNTER — Ambulatory Visit (INDEPENDENT_AMBULATORY_CARE_PROVIDER_SITE_OTHER): Payer: Medicare HMO | Admitting: Nurse Practitioner

## 2020-04-25 ENCOUNTER — Other Ambulatory Visit: Payer: Self-pay

## 2020-04-25 VITALS — BP 102/70 | HR 80 | Temp 97.6°F | Resp 18 | Wt 129.2 lb

## 2020-04-25 DIAGNOSIS — R399 Unspecified symptoms and signs involving the genitourinary system: Secondary | ICD-10-CM | POA: Diagnosis not present

## 2020-04-25 DIAGNOSIS — N390 Urinary tract infection, site not specified: Secondary | ICD-10-CM

## 2020-04-25 DIAGNOSIS — R319 Hematuria, unspecified: Secondary | ICD-10-CM

## 2020-04-25 LAB — URINALYSIS, ROUTINE W REFLEX MICROSCOPIC
Bilirubin Urine: NEGATIVE
Glucose, UA: NEGATIVE
Hyaline Cast: NONE SEEN /LPF
Ketones, ur: NEGATIVE
Nitrite: NEGATIVE
Protein, ur: NEGATIVE
Specific Gravity, Urine: 1.02 (ref 1.001–1.03)
pH: 7 (ref 5.0–8.0)

## 2020-04-25 LAB — MICROSCOPIC MESSAGE

## 2020-04-25 MED ORDER — CIPROFLOXACIN HCL 250 MG PO TABS: 250.0000 mg | ORAL_TABLET | Freq: Two times a day (BID) | ORAL | 0 refills | Status: AC

## 2020-04-25 NOTE — Progress Notes (Signed)
Subjective:    Sara Coffey is a 69 y.o. female who complains of burning with urination and frequency for 1 days.  Patient also complains of no other sxs, no fever, chills, CVA pain. Patient denies back pain, congestion, cough, fever, headache, rhinitis, sorethroat, stomach ache and vaginal discharge.  Patient does have a history of recurrent UTI.  Patient does not have a history of pyelonephritis. The following portions of the patient's history were reviewed and updated as appropriate: allergies, current medications, past family history, past medical history, past social history, past surgical history and problem list. Review of Systems Pertinent items noted in HPI and remainder of comprehensive ROS otherwise negative.    Objective:    BP 102/70 (BP Location: Left Arm, Patient Position: Sitting, Cuff Size: Normal)   Pulse 80   Temp 97.6 F (36.4 C) (Temporal)   Resp 18   Wt 129 lb 3.2 oz (58.6 kg)   SpO2 98%   BMI 22.18 kg/m  General: alert, cooperative, appears stated age and no distress  Abdomen: soft, non-tender, without masses or organomegaly in the epigastrium  Back: back muscles are normal  GU: defer exam   Laboratory:  Urine dipstick shows 1+ for hemoglobin and 3+ for leukocyte esterase.   Micro exam: not done.    Assessment:   UTI symptoms - Plan: Urinalysis, Routine w reflex microscopic  Urinary tract infection with hematuria, site unspecified - Plan: ciprofloxacin (CIPRO) 250 MG tablet, Urine Culture    Plan: Plan:    1. Medications: ciprofloxacin 2. Maintain adequate hydration 3. Follow up if symptoms not improving, and prn.

## 2020-04-27 ENCOUNTER — Encounter (HOSPITAL_COMMUNITY): Payer: Self-pay

## 2020-04-27 LAB — URINE CULTURE
MICRO NUMBER:: 10924210
SPECIMEN QUALITY:: ADEQUATE

## 2020-04-27 NOTE — Progress Notes (Signed)
I have attempted to place an introductory phone call to this patient. Unable to reach patient at this time. I have left a message with my contact information.

## 2020-05-01 ENCOUNTER — Inpatient Hospital Stay (HOSPITAL_COMMUNITY): Payer: Medicare HMO

## 2020-05-01 ENCOUNTER — Encounter (HOSPITAL_COMMUNITY): Payer: Self-pay | Admitting: Hematology

## 2020-05-01 ENCOUNTER — Other Ambulatory Visit: Payer: Self-pay

## 2020-05-01 ENCOUNTER — Inpatient Hospital Stay (HOSPITAL_COMMUNITY): Payer: Medicare HMO | Attending: Hematology | Admitting: Hematology

## 2020-05-01 VITALS — BP 90/57 | HR 77 | Resp 16 | Ht 62.75 in | Wt 129.8 lb

## 2020-05-01 DIAGNOSIS — Z800 Family history of malignant neoplasm of digestive organs: Secondary | ICD-10-CM | POA: Diagnosis not present

## 2020-05-01 DIAGNOSIS — C189 Malignant neoplasm of colon, unspecified: Secondary | ICD-10-CM

## 2020-05-01 DIAGNOSIS — M818 Other osteoporosis without current pathological fracture: Secondary | ICD-10-CM | POA: Insufficient documentation

## 2020-05-01 DIAGNOSIS — Z79899 Other long term (current) drug therapy: Secondary | ICD-10-CM | POA: Insufficient documentation

## 2020-05-01 DIAGNOSIS — R69 Illness, unspecified: Secondary | ICD-10-CM | POA: Diagnosis not present

## 2020-05-01 DIAGNOSIS — D649 Anemia, unspecified: Secondary | ICD-10-CM | POA: Insufficient documentation

## 2020-05-01 DIAGNOSIS — C182 Malignant neoplasm of ascending colon: Secondary | ICD-10-CM

## 2020-05-01 DIAGNOSIS — F17210 Nicotine dependence, cigarettes, uncomplicated: Secondary | ICD-10-CM | POA: Diagnosis not present

## 2020-05-01 DIAGNOSIS — Z8673 Personal history of transient ischemic attack (TIA), and cerebral infarction without residual deficits: Secondary | ICD-10-CM | POA: Diagnosis not present

## 2020-05-01 LAB — CBC WITH DIFFERENTIAL/PLATELET
Abs Immature Granulocytes: 0.01 10*3/uL (ref 0.00–0.07)
Basophils Absolute: 0 10*3/uL (ref 0.0–0.1)
Basophils Relative: 1 %
Eosinophils Absolute: 0.3 10*3/uL (ref 0.0–0.5)
Eosinophils Relative: 5 %
HCT: 31 % — ABNORMAL LOW (ref 36.0–46.0)
Hemoglobin: 9.4 g/dL — ABNORMAL LOW (ref 12.0–15.0)
Immature Granulocytes: 0 %
Lymphocytes Relative: 36 %
Lymphs Abs: 1.8 10*3/uL (ref 0.7–4.0)
MCH: 29.7 pg (ref 26.0–34.0)
MCHC: 30.3 g/dL (ref 30.0–36.0)
MCV: 98.1 fL (ref 80.0–100.0)
Monocytes Absolute: 0.3 10*3/uL (ref 0.1–1.0)
Monocytes Relative: 7 %
Neutro Abs: 2.5 10*3/uL (ref 1.7–7.7)
Neutrophils Relative %: 51 %
Platelets: 411 10*3/uL — ABNORMAL HIGH (ref 150–400)
RBC: 3.16 MIL/uL — ABNORMAL LOW (ref 3.87–5.11)
RDW: 14.8 % (ref 11.5–15.5)
WBC: 5 10*3/uL (ref 4.0–10.5)
nRBC: 0 % (ref 0.0–0.2)

## 2020-05-01 LAB — FOLATE: Folate: 16.8 ng/mL (ref >5.9)

## 2020-05-01 LAB — VITAMIN B12: Vitamin B-12: 165 pg/mL — ABNORMAL LOW (ref 180–914)

## 2020-05-01 LAB — IRON AND TIBC
Iron: 18 ug/dL — ABNORMAL LOW (ref 28–170)
Saturation Ratios: 4 % — ABNORMAL LOW (ref 10.4–31.8)
TIBC: 464 ug/dL — ABNORMAL HIGH (ref 250–450)
UIBC: 446 ug/dL

## 2020-05-01 LAB — FERRITIN: Ferritin: 24 ng/mL (ref 11–307)

## 2020-05-01 NOTE — Progress Notes (Signed)
Sara Coffey, [place] [address]   CLINIC:  Medical Oncology/Hematology  CONSULT NOTE  Patient Care Team: Annie Main, FNP as PCP - General (Family Medicine) Dishmon, Garwin Brothers, RN as Oncology Nurse Navigator (Oncology)  CHIEF COMPLAINTS/PURPOSE OF CONSULTATION:  Evaluation of colon cancer  HISTORY OF PRESENTING ILLNESS:  Sara Coffey 69 y.o. female is here because of evaluation of colon cancer, at the request of Dr. Curlene Labrum. She had a laparoscopic partial colectomy on 8/23.  Today she is accompanied by her daughter, Sara Coffey. She reports that her surgery went well. She denies having any hematochezia before the procedure, though she does have hemorrhoids which occasionally bleeds. 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 She denies having any unexpected weight loss before the procedure. She is taking 1 tablet of iron daily and tolerating it well. Her appetite is returned to normal when she left the hospital.  Her brother had prostate cancer; another brother had several types of cancer; PGF had throat cancer; maternal aunt had stomach cancer. 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 She continues smoking 1 PPD for 50 years.  MEDICAL HISTORY:  Past Medical History:  Diagnosis Date  . Amnesia 04/17/2019  . Hemorrhoid 09/28/2013  . Hyperlipidemia   . Osteoporosis   . Syncope and collapse   . TGA (transient global amnesia) 04/18/2019  . TIA (transient ischemic attack) 04/17/2019  . Tobacco abuse     SURGICAL HISTORY: Past Surgical History:  Procedure Laterality Date  . BIOPSY  03/02/2020   Procedure: BIOPSY;  Surgeon: Rogene Houston, MD;  Location: [facility];  Service: Endoscopy;;  ascending colon mass  . COLONOSCOPY WITH PROPOFOL N/A 03/02/2020   Procedure: COLONOSCOPY WITH PROPOFOL;  Surgeon: Rogene Houston, MD;  Location: [facility];  Service:  Endoscopy;  Laterality: N/A;  1055  . KNEE SX    . LAPAROSCOPIC PARTIAL COLECTOMY N/A 04/09/2020   Procedure: LAPAROSCOPIC PARTIAL COLECTOMY;  Surgeon: Virl Cagey, MD;  Location: [facility];  Service: General;  Laterality: N/A;  . left eye surgery     . POLYPECTOMY  03/02/2020   Procedure: POLYPECTOMY;  Surgeon: Rogene Houston, MD;  Location: [facility];  Service: Endoscopy;;  . TUBAL LIGATION      SOCIAL HISTORY: Social History   Socioeconomic History  . Marital status: Married    Spouse name: Not on file  . Number of children: 3  . Years of education: Not on file  . Highest education level: Not on file  Occupational History  . Occupation: retired  Tobacco Use  . Smoking status: Current Every Day Smoker    Packs/day: 1.00    Years: 40.00    Pack years: 40.00    Types: Cigarettes  . Smokeless tobacco: Never Used  Vaping Use  . Vaping Use: Never used  Substance and Sexual Activity  . Alcohol use: No  . Drug use: No  . Sexual activity: Not Currently    Birth control/protection: Surgical, Post-menopausal    Comment: tubal  Other Topics Concern  . Not on file  Social History Narrative  . Not on file   Social Determinants of Health   Financial Resource Strain: Low Risk   . Difficulty of Paying Living Expenses: Not hard at all  Food Insecurity: No Food Insecurity  . Worried About Charity fundraiser  in the Last Year: Never true  . Ran Out of Food in the Last Year: Never true  Transportation Needs: No Transportation Needs  . Lack of Transportation (Medical): No  . Lack of Transportation (Non-Medical): No  Physical Activity: Inactive  . Days of Exercise per Week: 0 days  . Minutes of Exercise per Session: 0 min  Stress: No Stress Concern Present  . Feeling of Stress : Only a little  Social Connections: Moderately Isolated  . Frequency of Communication with Friends and Family: More than three times a week  . Frequency of Social Gatherings with Friends and  Family: Once a week  . Attends Religious Services: Never  . Active Member of Clubs or Organizations: No  . Attends Archivist Meetings: Never  . Marital Status: Married  Human resources officer Violence: Not At Risk  . Fear of Current or Ex-Partner: No  . Emotionally Abused: No  . Physically Abused: No  . Sexually Abused: No    FAMILY HISTORY: Family History  Problem Relation Age of Onset  . Heart disease Father   . Hypertension Father   . Diabetes Father   . Cancer Brother   . Healthy Son   . Healthy Son   . Healthy Daughter   . Ulcers Mother   . Kidney failure Maternal Grandfather   . Throat cancer Paternal Grandfather   . Cancer Brother   . Breast cancer Neg Hx     ALLERGIES:  has No Known Allergies.  MEDICATIONS:  Current Outpatient Medications  Medication Sig Dispense Refill  . acetaminophen (TYLENOL) 500 MG tablet Take 1,000 mg by mouth every 6 (six) hours as needed for headache.     . Calcium Carb-Cholecalciferol (CALCIUM-VITAMIN D) 600-400 MG-UNIT TABS Take 1 tablet by mouth daily.    . calcium carbonate (TUMS - DOSED IN MG ELEMENTAL CALCIUM) 500 MG chewable tablet Chew 1 tablet by mouth as needed for indigestion or heartburn.    . ciprofloxacin (CIPRO) 250 MG tablet Take 1 tablet (250 mg total) by mouth 2 (two) times daily for 7 days. 14 tablet 0  . Ferrous Sulfate (IRON) 325 (65 Fe) MG TABS Take 1 tablet by mouth daily.    . Melatonin 5 MG TABS Take 5 mg by mouth at bedtime as needed (sleep).     . Omega-3 Fatty Acids (FISH OIL) 1000 MG CAPS Take 2,000 mg by mouth in the morning and at bedtime.    . rosuvastatin (CRESTOR) 5 MG tablet Take 1 tablet (5 mg total) by mouth daily. 30 tablet 6   No current facility-administered medications for this visit.    REVIEW OF SYSTEMS:   Review of Systems  Constitutional: Positive for fatigue (mild). Negative for appetite change.  Gastrointestinal: Negative for blood in stool.  Genitourinary: Positive for difficulty  urinating (recent UTI's).   Musculoskeletal: Positive for myalgias (soreness).  Psychiatric/Behavioral: Positive for depression. The patient is nervous/anxious.   All other systems reviewed and are negative.    PHYSICAL EXAMINATION: ECOG PERFORMANCE STATUS: 0 - Asymptomatic  Vitals:   05/01/20 1354  BP: (!) 90/57  Pulse: 77  Resp: 16  SpO2: 99%   Filed Weights   05/01/20 1354  Weight: 129 lb 12.8 oz (58.9 kg)   Physical Exam Vitals reviewed.  Constitutional:      Appearance: Normal appearance.  Cardiovascular:     Rate and Rhythm: Normal rate and regular rhythm.     Pulses: Normal pulses.     Heart sounds: Normal  heart sounds.  Pulmonary:     Effort: Pulmonary effort is normal.     Breath sounds: Normal breath sounds.  Abdominal:     Palpations: Abdomen is soft. There is no hepatomegaly, splenomegaly or mass.     Tenderness: There is no abdominal tenderness.     Hernia: No hernia is present.     Comments: Laparoscopic incisions healing nicely  Lymphadenopathy:     Cervical: No cervical adenopathy.     Upper Body:     Right upper body: No supraclavicular adenopathy.     Left upper body: No supraclavicular adenopathy.  Neurological:     General: No focal deficit present.     Mental Status: She is alert and oriented to person, place, and time.  Psychiatric:        Mood and Affect: Mood normal.        Behavior: Behavior normal.      LABORATORY DATA:  I have reviewed the data as listed CBC Latest Ref Rng & Units 04/19/2020 04/12/2020 04/12/2020  WBC 4.0 - 10.5 K/uL 6.3 - 5.5  Hemoglobin 12.0 - 15.0 g/dL 7.9(L) 7.7(L) 7.2(L)  Hematocrit 36 - 46 % 25.1(L) 23.7(L) 22.8(L)  Platelets 150 - 400 K/uL 523(H) - 265   CMP Latest Ref Rng & Units 04/12/2020 04/11/2020 04/10/2020  Glucose 70 - 99 mg/dL 88 113(H) 105(H)  BUN 8 - 23 mg/dL '9 10 9  ' Creatinine 0.44 - 1.00 mg/dL 0.57 0.67 0.82  Sodium 135 - 145 mmol/L 137 136 137  Potassium 3.5 - 5.1 mmol/L 3.9 4.1 3.4(L)    Chloride 98 - 111 mmol/L 107 108 100  CO2 22 - 32 mmol/L '24 23 29  ' Calcium 8.9 - 10.3 mg/dL 8.2(L) 7.9(L) 8.9  Total Protein 6.1 - 8.1 g/dL - - -  Total Bilirubin 0.2 - 1.2 mg/dL - - -  Alkaline Phos 38 - 126 U/L - - -  AST 10 - 35 U/L - - -  ALT 6 - 29 U/L - - -   Surgical pathology ([id number]) on 04/09/2020: Right colon resection: adenocarcinoma, moderately differentiated, grade 2.  RADIOGRAPHIC STUDIES: I have personally reviewed the radiological images as listed and agreed with the findings in the report. No results found.  ASSESSMENT:  1.  Stage II (T3N0) right colon adenocarcinoma: -Colonoscopy on 03/02/2020 biopsy of the ascending colon mass consistent with adenocarcinoma.  Sessile serrated polyp in the hepatic flexure, tubular adenoma in the hepatic, transverse and proximal colon.  Tubular adenoma in the rectum. -CTAP on 03/14/2020 shows 4.6 cm intraluminal mass in the right colon consistent with known colon cancer.  No evidence of metastatic disease in the abdomen or pelvis. -Preoperative CEA on 04/06/2020 was 3.6. -Right hemicolectomy on 04/09/2020 with pathology showing 3.5 cm moderately differentiated adenocarcinoma, separate focus of invasive adenocarcinoma 0.5 cm arising in a tubular adenoma with high-grade dysplasia, 0/32 lymph nodes involved, no perforation, no lymphovascular/perineural invasion, margins negative, PT3PN0. -MMR shows loss of nuclear expression of MLH1 and PMS2.  MSI was high.  2.  Social/family history: -She is a retired Psychiatric nurse. -Current active smoker, 1 pack/day for 87 years. -One brother had prostate cancer.  Another brother had multiple different type of cancers, known to the patient.  Paternal grandfather had throat cancer and a maternal aunt had stomach cancer.  PLAN:  1.  Stage II (T3N0) right colon adenocarcinoma: -I have discussed the findings on the CT scan and the pathology report in detail. -I have recommended a CT scan  of the  chest with contrast to complete staging work-up. -He does not require any adjuvant chemotherapy due to the lack of high risk features and noninvolvement of lymph nodes. -I have recommended surveillance once every 3 months with CEA level and every 6 months with a CT scan of the abdomen and pelvis for the first 2 years.  She will also have a colonoscopy 1 year from the last. -Given the loss of nuclear expression of MLH1 and PMS2, I have recommended testing for BRAF and MLH1 hypermethylation test. -We will see her back in a couple of weeks to discuss results.  If she does not have BRAF mutation or MLH1 hyper methylation, she will be referred for genetic testing.  2.  Normocytic anemia: -Hemoglobin on 04/19/2020 was 7.9 with MCV 95.1. -Patient started taking iron tablet daily for the last 3 weeks.  She is tolerating it reasonably well. -We will check for other nutritional deficiencies.    All questions were answered. The patient knows to call the clinic with any problems, questions or concerns.  Derek Jack, MD, 05/01/20 2:15 PM  [place] [phone]   I, Milinda Antis, am acting as a scribe for Dr. Sanda Linger.  I, Derek Jack MD, have reviewed the above documentation for accuracy and completeness, and I agree with the above.

## 2020-05-01 NOTE — Patient Instructions (Signed)
[place] at [facility] Discharge Instructions     Thank you for choosing [place] at [facility] to provide your oncology and hematology care.  To afford each patient quality time with our provider, please arrive at least 15 minutes before your scheduled appointment time.   If you have a lab appointment with the [facility] please come in thru the Main Entrance and check in at the main information desk.  You need to re-schedule your appointment should you arrive 10 or more minutes late.  We strive to give you quality time with our providers, and arriving late affects you and other patients whose appointments are after yours.  Also, if you no show three or more times for appointments you may be dismissed from the clinic at the providers discretion.     Again, thank you for choosing [facility]  Our hope is that these requests will decrease the amount of time that you wait before being seen by our physicians.       _____________________________________________________________  Should you have questions after your visit to [facility], please contact our office at [phone] and follow the prompts.  Our office hours are 8:00 a.m. and 4:30 p.m. Monday - Friday.  Please note that voicemails left after 4:00 p.m. may not be returned until the following business day.  We are closed weekends and major holidays.  You do have access to a nurse 24-7, just call the main number to the clinic [phone] and do not press any options, hold on the line and a nurse will answer the phone.    For prescription refill requests, have your pharmacy contact our office and allow 72 hours.    Due to Covid, you will need to wear a mask upon entering the hospital. If you do not have a mask, a mask will be given to you at the Main Entrance upon arrival. For doctor visits, patients may have 1 support person age 63 or older with them. For  treatment visits, patients can not have anyone with them due to social distancing guidelines and our immunocompromised population.

## 2020-05-02 LAB — PROTEIN ELECTROPHORESIS, SERUM
A/G Ratio: 1.3 (ref 0.7–1.7)
Albumin ELP: 3.8 g/dL (ref 2.9–4.4)
Alpha-1-Globulin: 0.3 g/dL (ref 0.0–0.4)
Alpha-2-Globulin: 0.9 g/dL (ref 0.4–1.0)
Beta Globulin: 1.1 g/dL (ref 0.7–1.3)
Gamma Globulin: 0.7 g/dL (ref 0.4–1.8)
Globulin, Total: 2.9 g/dL (ref 2.2–3.9)
Total Protein ELP: 6.7 g/dL (ref 6.0–8.5)

## 2020-05-04 LAB — METHYLMALONIC ACID, SERUM: Methylmalonic Acid, Quantitative: 212 nmol/L (ref 0–378)

## 2020-05-06 LAB — COPPER, SERUM: Copper: 142 ug/dL (ref 80–158)

## 2020-05-11 ENCOUNTER — Encounter (HOSPITAL_COMMUNITY): Payer: Self-pay | Admitting: Hematology

## 2020-05-14 ENCOUNTER — Encounter (HOSPITAL_COMMUNITY): Payer: Self-pay | Admitting: Hematology

## 2020-05-15 ENCOUNTER — Ambulatory Visit (HOSPITAL_COMMUNITY)
Admission: RE | Admit: 2020-05-15 | Discharge: 2020-05-15 | Disposition: A | Discharge status: Home / Self Care | Payer: Medicare HMO | Source: Ambulatory Visit | Attending: Hematology | Admitting: Hematology

## 2020-05-15 ENCOUNTER — Other Ambulatory Visit: Payer: Self-pay

## 2020-05-15 DIAGNOSIS — Z9049 Acquired absence of other specified parts of digestive tract: Secondary | ICD-10-CM | POA: Diagnosis not present

## 2020-05-15 DIAGNOSIS — J984 Other disorders of lung: Secondary | ICD-10-CM | POA: Diagnosis not present

## 2020-05-15 DIAGNOSIS — I2510 Atherosclerotic heart disease of native coronary artery without angina pectoris: Secondary | ICD-10-CM | POA: Diagnosis not present

## 2020-05-15 DIAGNOSIS — I700 Atherosclerosis of aorta: Secondary | ICD-10-CM | POA: Diagnosis not present

## 2020-05-15 DIAGNOSIS — C189 Malignant neoplasm of colon, unspecified: Secondary | ICD-10-CM | POA: Insufficient documentation

## 2020-05-15 LAB — POCT I-STAT CREATININE: Creatinine, Ser: 0.8 mg/dL (ref 0.44–1.00)

## 2020-05-15 MED ORDER — IOHEXOL 300 MG/ML  SOLN
75.0000 mL | Freq: Once | INTRAMUSCULAR | Status: AC | PRN
  Administered 2020-05-15: 75 mL via INTRAVENOUS

## 2020-05-16 LAB — SURGICAL PATHOLOGY

## 2020-05-17 ENCOUNTER — Other Ambulatory Visit: Payer: Self-pay

## 2020-05-17 ENCOUNTER — Inpatient Hospital Stay (HOSPITAL_BASED_OUTPATIENT_CLINIC_OR_DEPARTMENT_OTHER): Payer: Medicare HMO | Admitting: Hematology

## 2020-05-17 VITALS — BP 125/77 | HR 65 | Temp 96.9°F | Resp 18 | Wt 131.2 lb

## 2020-05-17 DIAGNOSIS — C189 Malignant neoplasm of colon, unspecified: Secondary | ICD-10-CM | POA: Diagnosis not present

## 2020-05-17 DIAGNOSIS — C182 Malignant neoplasm of ascending colon: Secondary | ICD-10-CM | POA: Diagnosis not present

## 2020-05-17 NOTE — Patient Instructions (Signed)
[place] at [facility] Discharge Instructions  You were seen today by Dr. Delton Coombes. He went over your recent scans and results; your cancer most likely arose from a sporadic mutation, not a familial inheritance. Make sure to take 1 mg of vitamin B12 daily, whether independently or as part of the multivitamin. Dr. Delton Coombes will see you back in 3 months for labs and follow up.   Thank you for choosing [place] at [facility] to provide your oncology and hematology care.  To afford each patient quality time with our provider, please arrive at least 15 minutes before your scheduled appointment time.   If you have a lab appointment with the [facility] please come in thru the Main Entrance and check in at the main information desk  You need to re-schedule your appointment should you arrive 10 or more minutes late.  We strive to give you quality time with our providers, and arriving late affects you and other patients whose appointments are after yours.  Also, if you no show three or more times for appointments you may be dismissed from the clinic at the providers discretion.     Again, thank you for choosing [facility].  Our hope is that these requests will decrease the amount of time that you wait before being seen by our physicians.       _____________________________________________________________  Should you have questions after your visit to [facility], please contact our office at [phone] between the hours of 8:00 a.m. and 4:30 p.m.  Voicemails left after 4:00 p.m. will not be returned until the following business day.  For prescription refill requests, have your pharmacy contact our office and allow 72 hours.    Cancer Center Support Programs:   > Cancer Support Group  2nd Tuesday of the month 1pm-2pm, Journey Room

## 2020-05-17 NOTE — Progress Notes (Signed)
[place], [place] [address]   CLINIC:  Medical Oncology/Hematology  PCP:  Annie Main, FNP None None   REASON FOR VISIT:  Follow-up for colon cancer  PRIOR THERAPY: Right hemicolectomy on 04/09/2020  NGS Results: MSI--high; BRAF V600E; MLH1 hyper methylation positive  CURRENT THERAPY: Under work-up  BRIEF ONCOLOGIC HISTORY:  Oncology History   No history exists.    CANCER STAGING: Cancer Staging No matching staging information was found for the patient.  INTERVAL HISTORY:  [name], a 69 y.o. female, returns for routine follow-up of her colon cancer. Kirstyn was last seen on 05/01/2020.  Today she is accompanied by her daughter. She reports feeling well today. She is taking iron tablets and multivitamin daily.   REVIEW OF SYSTEMS:  Review of Systems  Constitutional: Positive for fatigue (75%). Negative for appetite change.  All other systems reviewed and are negative.   PAST MEDICAL/SURGICAL HISTORY:  Past Medical History:  Diagnosis Date  . Amnesia 04/17/2019  . Hemorrhoid 09/28/2013  . Hyperlipidemia   . Osteoporosis   . Syncope and collapse   . TGA (transient global amnesia) 04/18/2019  . TIA (transient ischemic attack) 04/17/2019  . Tobacco abuse    Past Surgical History:  Procedure Laterality Date  . BIOPSY  03/02/2020   Procedure: BIOPSY;  Surgeon: Rogene Houston, MD;  [facility]: [facility];  Service: Endoscopy;;  ascending colon mass  . COLONOSCOPY WITH PROPOFOL N/A 03/02/2020   Procedure: COLONOSCOPY WITH PROPOFOL;  Surgeon: Rogene Houston, MD;  Location: [facility];  Service: Endoscopy;  Laterality: N/A;  1055  . KNEE SX    . LAPAROSCOPIC PARTIAL COLECTOMY N/A 04/09/2020   Procedure: LAPAROSCOPIC PARTIAL COLECTOMY;  Surgeon: Virl Cagey, MD;  Location: [facility];  Service: General;  Laterality: N/A;  . left eye surgery     . POLYPECTOMY  03/02/2020   Procedure: POLYPECTOMY;  Surgeon:  Rogene Houston, MD;  Location: [facility];  Service: Endoscopy;;  . TUBAL LIGATION      SOCIAL HISTORY:  Social History   Socioeconomic History  . Marital status: Married    Spouse name: Not on file  . Number of children: 3  . Years of education: Not on file  . Highest education level: Not on file  Occupational History  . Occupation: retired  Tobacco Use  . Smoking status: Current Every Day Smoker    Packs/day: 1.00    Years: 40.00    Pack years: 40.00    Types: Cigarettes  . Smokeless tobacco: Never Used  Vaping Use  . Vaping Use: Never used  Substance and Sexual Activity  . Alcohol use: No  . Drug use: No  . Sexual activity: Not Currently    Birth control/protection: Surgical, Post-menopausal    Comment: tubal  Other Topics Concern  . Not on file  Social History Narrative  . Not on file   Social Determinants of Health   Financial Resource Strain: Low Risk   . Difficulty of Paying Living Expenses: Not hard at all  Food Insecurity: No Food Insecurity  . Worried About Charity fundraiser in the Last Year: Never true  . Ran Out of Food in the Last Year: Never true  Transportation Needs: No Transportation Needs  . Lack of Transportation (Medical): No  . Lack of Transportation (Non-Medical): No  Physical Activity: Inactive  . Days of Exercise per Week: 0 days  . Minutes  of Exercise per Session: 0 min  Stress: No Stress Concern Present  . Feeling of Stress : Only a little  Social Connections: Moderately Isolated  . Frequency of Communication with Friends and Family: More than three times a week  . Frequency of Social Gatherings with Friends and Family: Once a week  . Attends Religious Services: Never  . Active Member of Clubs or Organizations: No  . Attends Archivist Meetings: Never  . Marital Status: Married  Human resources officer Violence: Not At Risk  . Fear of Current or Ex-Partner: No  . Emotionally Abused: No  . Physically Abused: No  .  Sexually Abused: No    FAMILY HISTORY:  Family History  Problem Relation Age of Onset  . Heart disease Father   . Hypertension Father   . Diabetes Father   . Cancer Brother   . Healthy Son   . Healthy Son   . Healthy Daughter   . Ulcers Mother   . Kidney failure Maternal Grandfather   . Throat cancer Paternal Grandfather   . Cancer Brother   . Breast cancer Neg Hx     CURRENT MEDICATIONS:  Current Outpatient Medications  Medication Sig Dispense Refill  . acetaminophen (TYLENOL) 500 MG tablet Take 1,000 mg by mouth every 6 (six) hours as needed for headache.     . Calcium Carb-Cholecalciferol (CALCIUM-VITAMIN D) 600-400 MG-UNIT TABS Take 1 tablet by mouth daily.    . calcium carbonate (TUMS - DOSED IN MG ELEMENTAL CALCIUM) 500 MG chewable tablet Chew 1 tablet by mouth as needed for indigestion or heartburn.    . Ferrous Sulfate (IRON) 325 (65 Fe) MG TABS Take 1 tablet by mouth daily.    . Melatonin 5 MG TABS Take 5 mg by mouth at bedtime as needed (sleep).     . Omega-3 Fatty Acids (FISH OIL) 1000 MG CAPS Take 2,000 mg by mouth in the morning and at bedtime.    . rosuvastatin (CRESTOR) 5 MG tablet Take 1 tablet (5 mg total) by mouth daily. 30 tablet 6   No current facility-administered medications for this visit.    ALLERGIES:  No Known Allergies  PHYSICAL EXAM:  Performance status (ECOG): 0 - Asymptomatic  Vitals:   05/17/20 1308  BP: 125/77  Pulse: 65  Resp: 18  Temp: (!) 96.9 F (36.1 C)  SpO2: 98%   Wt Readings from Last 3 Encounters:  05/17/20 131 lb 3.2 oz (59.5 kg)  05/01/20 129 lb 12.8 oz (58.9 kg)  04/25/20 129 lb 3.2 oz (58.6 kg)   Physical Exam   LABORATORY DATA:  I have reviewed the labs as listed.  CBC Latest Ref Rng & Units 05/01/2020 04/19/2020 04/12/2020  WBC 4.0 - 10.5 K/uL 5.0 6.3 -  Hemoglobin 12.0 - 15.0 g/dL 9.4(L) 7.9(L) 7.7(L)  Hematocrit 36 - 46 % 31.0(L) 25.1(L) 23.7(L)  Platelets 150 - 400 K/uL 411(H) 523(H) -   CMP Latest Ref Rng &  Units 05/15/2020 04/12/2020 04/11/2020  Glucose 70 - 99 mg/dL - 88 113(H)  BUN 8 - 23 mg/dL - 9 10  Creatinine 0.44 - 1.00 mg/dL 0.80 0.57 0.67  Sodium 135 - 145 mmol/L - 137 136  Potassium 3.5 - 5.1 mmol/L - 3.9 4.1  Chloride 98 - 111 mmol/L - 107 108  CO2 22 - 32 mmol/L - 24 23  Calcium 8.9 - 10.3 mg/dL - 8.2(L) 7.9(L)  Total Protein 6.1 - 8.1 g/dL - - -  Total Bilirubin 0.2 -  1.2 mg/dL - - -  Alkaline Phos 38 - 126 U/L - - -  AST 10 - 35 U/L - - -  ALT 6 - 29 U/L - - -   Lab Results  Component Value Date   TIBC 464 (H) 05/01/2020   FERRITIN 24 05/01/2020   IRONPCTSAT 4 (L) 05/01/2020   Lab Results  Component Value Date   TOTALPROTELP 6.7 05/01/2020   ALBUMINELP 3.8 05/01/2020   A1GS 0.3 05/01/2020   A2GS 0.9 05/01/2020   BETS 1.1 05/01/2020   GAMS 0.7 05/01/2020   MSPIKE Not Observed 05/01/2020   SPEI Comment 05/01/2020    No results found for: KPAFRELGTCHN, LAMBDASER, KAPLAMBRATIO  DIAGNOSTIC IMAGING:  I have independently reviewed the scans and discussed with the patient. CT Chest W Contrast  Result Date: 05/16/2020 CLINICAL DATA:  Recently diagnosed colorectal cancer, status post laparoscopic partial colectomy, chest staging EXAM: CT CHEST WITH CONTRAST TECHNIQUE: Multidetector CT imaging of the chest was performed during intravenous contrast administration. CONTRAST:  65m OMNIPAQUE IOHEXOL 300 MG/ML  SOLN COMPARISON:  CT abdomen pelvis, 03/14/2020 FINDINGS: Cardiovascular: Aortic atherosclerosis. Normal heart size. Scattered coronary artery calcifications. No pericardial effusion. Mediastinum/Nodes: No enlarged mediastinal, hilar, or axillary lymph nodes. Thyroid gland, trachea, and esophagus demonstrate no significant findings. Lungs/Pleura: Mild dependent scarring of the lung bases. 3 mm pulmonary nodule of the dependent right lower lobe (series 4, image 73). 7 mm ground-glass opacity of the dependent superior segment left lower lobe (series 4, image 45). No pleural  effusion or pneumothorax. Upper Abdomen: No acute abnormality. Musculoskeletal: No chest wall mass or suspicious bone lesions identified. IMPRESSION: 1. No specific evidence of metastatic disease in the chest. 2. There is a 3 mm pulmonary nodule of the dependent right lower lobe, nonspecific although likely sequelae of prior infection or inflammation. Attention on follow-up. 3. Nonspecific 7 mm ground-glass opacity of the dependent superior segment left lower lobe, likely infectious or inflammatory although indolent primary lung adenocarcinoma is a differential consideration. Metastatic disease not favored given morphology. Attention on follow-up. 4. Coronary artery disease.  Aortic Atherosclerosis (ICD10-I70.0). Electronically Signed   By: AEddie CandleM.D.   On: 05/16/2020 08:49     ASSESSMENT:  1.  Stage II (T3N0) right colon adenocarcinoma: -Colonoscopy on 03/02/2020 biopsy of the ascending colon mass consistent with adenocarcinoma.  Sessile serrated polyp in the hepatic flexure, tubular adenoma in the hepatic, transverse and proximal colon.  Tubular adenoma in the rectum. -CTAP on 03/14/2020 shows 4.6 cm intraluminal mass in the right colon consistent with known colon cancer.  No evidence of metastatic disease in the abdomen or pelvis. -Preoperative CEA on 04/06/2020 was 3.6. -Right hemicolectomy on 04/09/2020 with pathology showing 3.5 cm moderately differentiated adenocarcinoma, separate focus of invasive adenocarcinoma 0.5 cm arising in a tubular adenoma with high-grade dysplasia, 0/32 lymph nodes involved, no perforation, no lymphovascular/perineural invasion, margins negative, PT3PN0. -MMR shows loss of nuclear expression of MLH1 and PMS2.  MSI was high. -BRAF V600 and MLH hypermethylation positive. -CT chest with contrast on 05/15/2020 with no metastatic disease.  3 mm pulmonary nodule of the dependent right lower lobe and 7 mm groundglass opacity of the dependent superior segment of the left lower  lobe.  2.  Social/family history: -She is a retired wPsychiatric nurse -Current active smoker, 1 pack/day for 576years. -One brother had prostate cancer.  Another brother had multiple different type of cancers, known to the patient.  Paternal grandfather had throat cancer and a maternal aunt had  stomach cancer.   PLAN:  1.  Stage II (T3N0) right colon adenocarcinoma: -We reviewed the genetic testing results.  She has BRAF mutation in MLH1 hyper methylation indicating sporadic nature of the cancer.  No further genetic testing needed. -We reviewed the CT of the chest from 05/15/2020 which did not show any metastatic disease in the chest. -We will plan to repeat labs in 3 months with a CEA.  CT scan will be done every 6 months for first 2 years.  2.  Normocytic anemia: -Hemoglobin improved to 9.4.  Continue iron tablet daily.  B12 is slightly low with normal methylmalonic acid.  Ferritin is 24 with percent saturation of 4.  SPEP is negative. -Recommend B12 1 mg tablet daily.  3.  Lung nodule: -There is a 3 mm pulmonary nodule of the dependent right lower lobe nonspecific.  Nonspecific 7 mm groundglass opacity of the dependent superior segment of left lower lobe likely infectious/inflammatory. -We will plan to repeat CT chest in 6 months.   Orders placed this encounter:  No orders of the defined types were placed in this encounter.    Derek Jack, MD [place] [phone]   I, Milinda Antis, am acting as a scribe for Dr. Sanda Linger.  I, Derek Jack MD, have reviewed the above documentation for accuracy and completeness, and I agree with the above.

## 2020-05-18 DIAGNOSIS — R69 Illness, unspecified: Secondary | ICD-10-CM | POA: Diagnosis not present

## 2020-05-22 ENCOUNTER — Encounter (HOSPITAL_COMMUNITY): Payer: Self-pay

## 2020-05-24 ENCOUNTER — Other Ambulatory Visit: Payer: Self-pay

## 2020-05-24 ENCOUNTER — Ambulatory Visit (INDEPENDENT_AMBULATORY_CARE_PROVIDER_SITE_OTHER): Payer: Medicare HMO | Admitting: General Surgery

## 2020-05-24 ENCOUNTER — Encounter: Payer: Self-pay | Admitting: General Surgery

## 2020-05-24 VITALS — BP 128/56 | HR 74 | Temp 98.4°F | Resp 14 | Ht 64.0 in | Wt 131.0 lb

## 2020-05-24 DIAGNOSIS — C182 Malignant neoplasm of ascending colon: Secondary | ICD-10-CM

## 2020-05-24 NOTE — Progress Notes (Signed)
[facility] Note   HPI:  69 y.o. Female presents to clinic for follow-up evaluation after laparoscopic right hemicolectomy for colon cancer. She is eating well and having good Bms. No complaints. She has been seen Oncology who is following her closely.   Review of Systems:  No fever or chills Minor soreness  All other review of systems: otherwise negative   Vital Signs:  BP (!) 128/56   Pulse 74   Temp 98.4 F (36.9 C) (Oral)   Resp 14   Ht 5\' 4"  (1.626 m)   Wt 131 lb (59.4 kg)   SpO2 94%   BMI 22.49 kg/m    Physical Exam:  Physical Exam Vitals reviewed.  Cardiovascular:     Rate and Rhythm: Normal rate.  Pulmonary:     Effort: Pulmonary effort is normal.  Abdominal:     General: There is no distension.     Palpations: Abdomen is soft.     Tenderness: There is no abdominal tenderness.     Comments: Healing midline extraction site and port sites, induration at incision      Assessment:  69 y.o. yo Female s/p laparoscopic right hemicolectomy for colon cancer. Doing very well. She is 6 weeks out now.  Plan:  - Diet and activity as tolerated   - Follow with Oncology   All of the above recommendations were discussed with the patient and patient's family, and all of patient's and family's questions were answered to their expressed satisfaction.  Curlene Labrum, MD [facility] [address] [place], [place] [address] [phone] (office)

## 2020-07-24 IMAGING — MR MRI HEAD WITHOUT CONTRAST
12 of 13 series · 44 of 48 positions shown · non-contrast
Comparison: Head CT at 9797 hours was negative.

CLINICAL DATA: Suspected transient global amnesia. Symptoms began
earlier today.

EXAM:
MRI HEAD WITHOUT CONTRAST
TECHNIQUE: Multiplanar, multiecho pulse sequences of the brain and surrounding
structures were obtained without intravenous contrast.

[Series 5: DWI · axial · 3.0mm · 0.88mm/px · z∈[-32,+119]mm · 8 of 104 slices shown (1 of 4)]
[im 1/104]
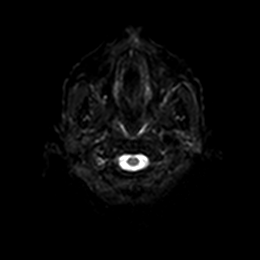
[im 15/104]
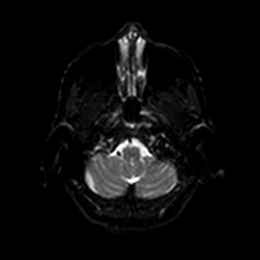
[im 30/104]
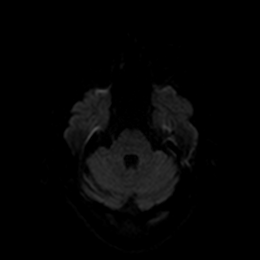
[im 45/104]
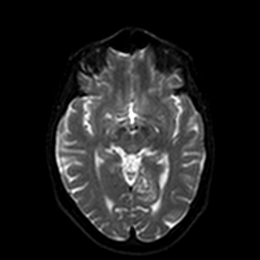
[im 59/104]
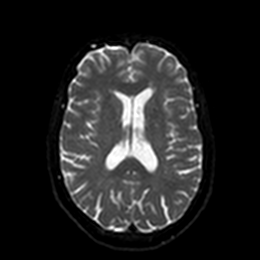
[im 74/104]
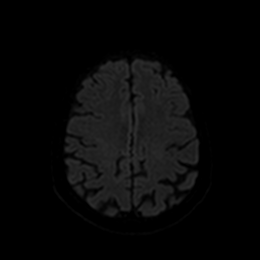
[im 89/104]
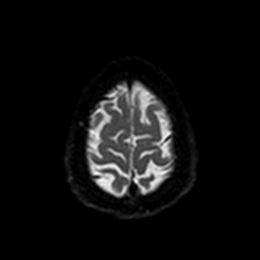
[im 104/104]
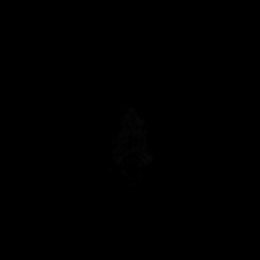

[Series 6: DWI · axial · 3.0mm · 0.88mm/px · z∈[-32,+119]mm · 4 of 52 slices shown (2 of 4)]
[im 1/52]
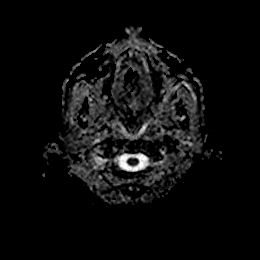
[im 18/52]
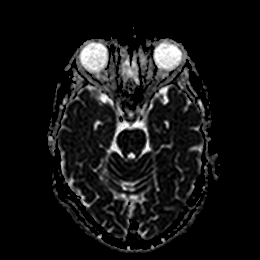
[im 35/52]
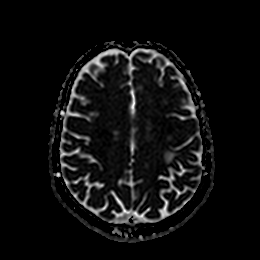
[im 52/52]
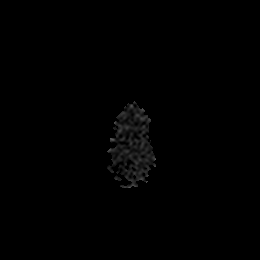

[Series 7: DWI · coronal · 4.0mm · 0.88mm/px · 6 of 78 slices shown (3 of 4)]
[im 1/78]
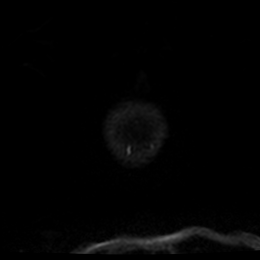
[im 16/78]
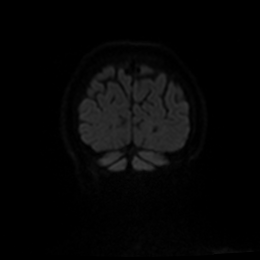
[im 31/78]
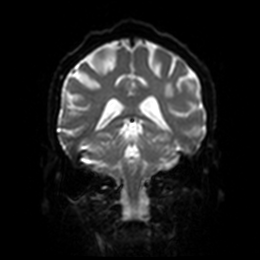
[im 47/78]
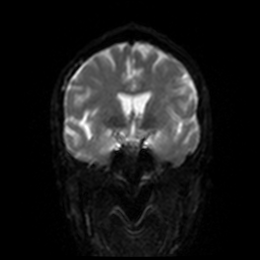
[im 62/78]
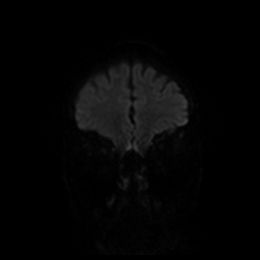
[im 78/78]
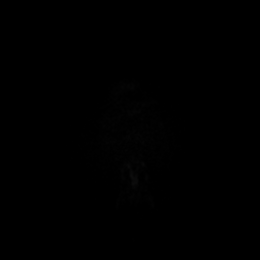

[Series 8: DWI · coronal · 4.0mm · 0.88mm/px · 3 of 39 slices shown (4 of 4)]
[im 1/39]
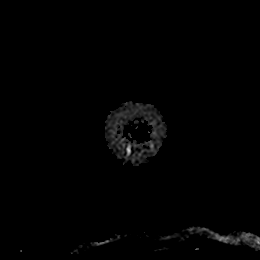
[im 20/39]
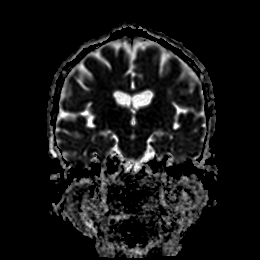
[im 39/39]
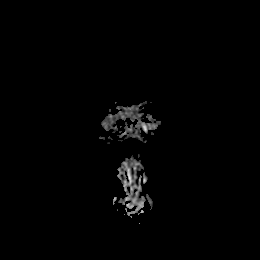

[Series 14: mag_images · axial · 3.0mm · 0.90mm/px · z∈[-36,+115]mm · 4 of 52 slices shown]
[im 1/52]
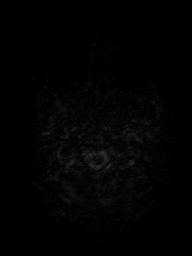
[im 18/52]
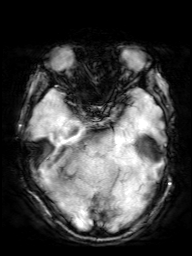
[im 35/52]
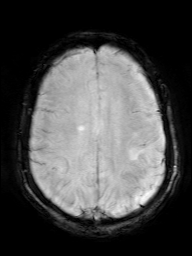
[im 52/52]
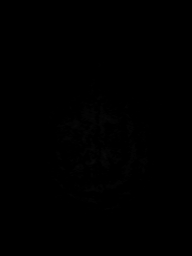

[Series 15: pha_images · axial · 3.0mm · 0.90mm/px · z∈[-27,+115]mm · 4 of 48 slices shown]
[im 1/48]
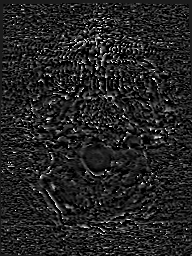
[im 16/48]
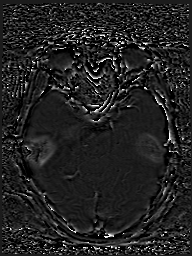
[im 32/48]
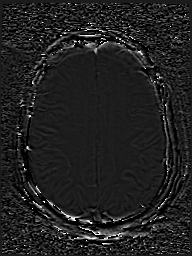
[im 48/48]
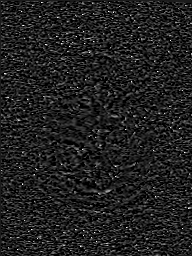

[Series 16: swi_images · axial · 3.0mm · 0.90mm/px · z∈[-36,+115]mm · 4 of 52 slices shown]
[im 1/52]
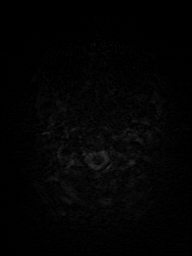
[im 18/52]
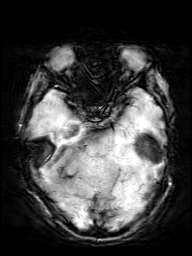
[im 35/52]
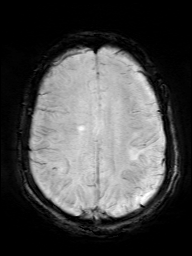
[im 52/52]
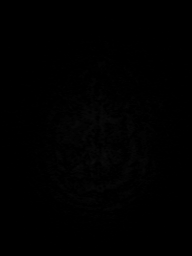

[Series 17: mip_images(sw) · axial · 24.0mm · 0.90mm/px · z∈[-26,+104]mm · 3 of 45 slices shown]
[im 1/45]
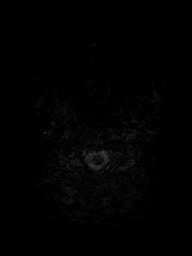
[im 23/45]
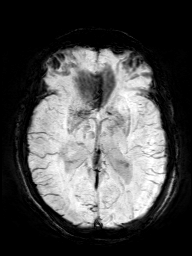
[im 45/45]
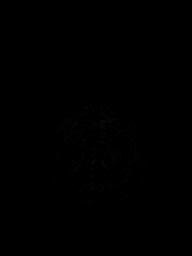

[Series 18: FLAIR · axial · 5.0mm · 0.45mm/px · z∈[-35,+113]mm · 2 of 26 slices shown]
[im 1/26]
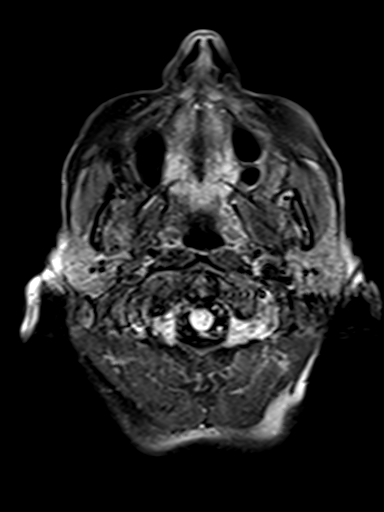
[im 26/26]
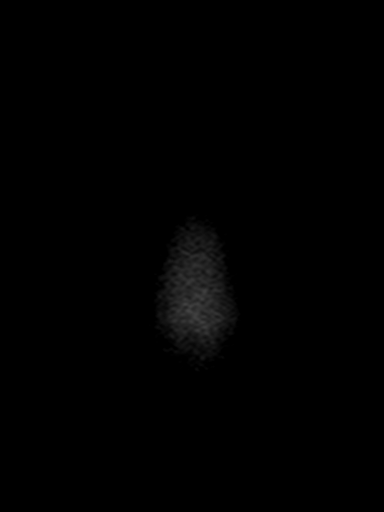

[Series 19: T1 · sagittal · 5.0mm · 0.94mm/px · 2 of 25 slices shown]
[im 1/25]
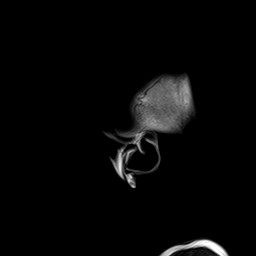
[im 25/25]
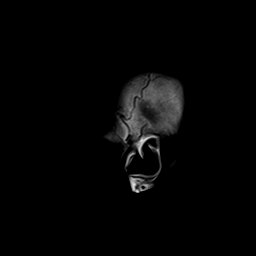

[Series 20: T2 · axial · 5.0mm · 0.90mm/px · z∈[-32,+116]mm · 2 of 26 slices shown (1 of 2)]
[im 1/26]
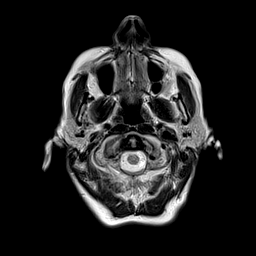
[im 26/26]
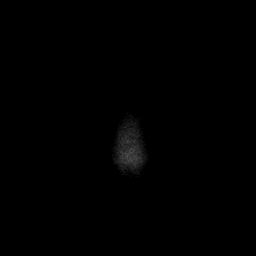

[Series 21: T2 · coronal · 5.0mm · 0.43mm/px · 2 of 32 slices shown (2 of 2)]
[im 1/32]
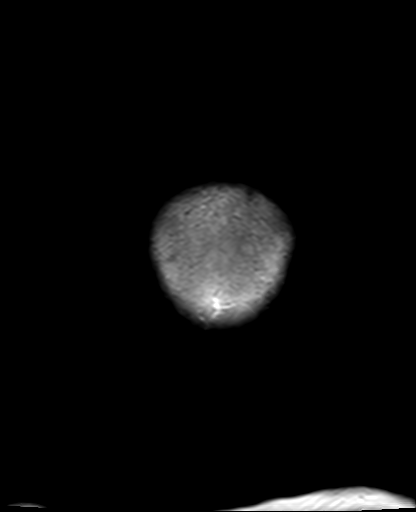
[im 32/32]
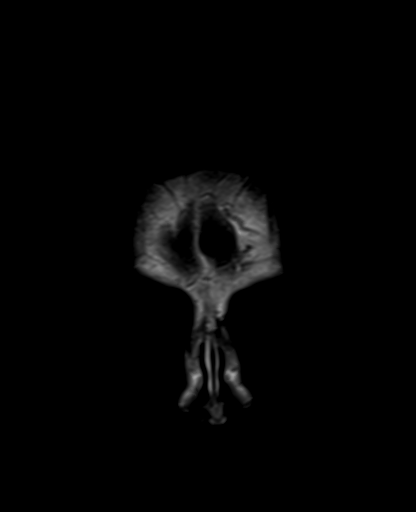

[44 of 48 positions shown; findings below may reference images not displayed]

FINDINGS: Brain: No evidence for acute infarction, hemorrhage, mass lesion,
hydrocephalus, or extra-axial fluid. Mild cerebral and cerebellar
atrophy. Mild to moderate T2 and FLAIR hyperintensities in the white
matter, likely small vessel disease.

Vascular: Normal flow voids.

Skull and upper cervical spine: Normal marrow signal.

Sinuses/Orbits: No acute findings.

Other: Negative.
IMPRESSION: Mild atrophy.  Mild to moderate small vessel disease.

No acute intracranial findings. Specifically no evidence of acute
stroke.

Consistent with the Neurology clinical impression, typically imaging
findings in patients with transient global amnesia are negative.

## 2020-08-16 ENCOUNTER — Other Ambulatory Visit: Payer: Self-pay

## 2020-08-16 ENCOUNTER — Inpatient Hospital Stay (HOSPITAL_COMMUNITY): Payer: Medicare HMO | Attending: Hematology

## 2020-08-16 DIAGNOSIS — C182 Malignant neoplasm of ascending colon: Secondary | ICD-10-CM | POA: Diagnosis not present

## 2020-08-16 DIAGNOSIS — D509 Iron deficiency anemia, unspecified: Secondary | ICD-10-CM | POA: Insufficient documentation

## 2020-08-16 DIAGNOSIS — C189 Malignant neoplasm of colon, unspecified: Secondary | ICD-10-CM

## 2020-08-16 LAB — COMPREHENSIVE METABOLIC PANEL
ALT: 15 U/L (ref 0–44)
AST: 20 U/L (ref 15–41)
Albumin: 4.3 g/dL (ref 3.5–5.0)
Alkaline Phosphatase: 58 U/L (ref 38–126)
Anion gap: 10 (ref 5–15)
BUN: 11 mg/dL (ref 8–23)
CO2: 28 mmol/L (ref 22–32)
Calcium: 9.7 mg/dL (ref 8.9–10.3)
Chloride: 103 mmol/L (ref 98–111)
Creatinine, Ser: 0.83 mg/dL (ref 0.44–1.00)
GFR, Estimated: >60 mL/min (ref >60)
Glucose, Bld: 124 mg/dL — ABNORMAL HIGH (ref 70–99)
Potassium: 4.9 mmol/L (ref 3.5–5.1)
Sodium: 141 mmol/L (ref 135–145)
Total Bilirubin: 0.4 mg/dL (ref 0.3–1.2)
Total Protein: 7.2 g/dL (ref 6.5–8.1)

## 2020-08-16 LAB — IRON AND TIBC
Iron: 83 ug/dL (ref 28–170)
Saturation Ratios: 20 % (ref 10.4–31.8)
TIBC: 415 ug/dL (ref 250–450)
UIBC: 332 ug/dL

## 2020-08-16 LAB — CBC WITH DIFFERENTIAL/PLATELET
Abs Immature Granulocytes: 0.01 10*3/uL (ref 0.00–0.07)
Basophils Absolute: 0.1 10*3/uL (ref 0.0–0.1)
Basophils Relative: 2 %
Eosinophils Absolute: 0.2 10*3/uL (ref 0.0–0.5)
Eosinophils Relative: 4 %
HCT: 42.6 % (ref 36.0–46.0)
Hemoglobin: 13.9 g/dL (ref 12.0–15.0)
Immature Granulocytes: 0 %
Lymphocytes Relative: 43 %
Lymphs Abs: 2.1 10*3/uL (ref 0.7–4.0)
MCH: 30.3 pg (ref 26.0–34.0)
MCHC: 32.6 g/dL (ref 30.0–36.0)
MCV: 92.8 fL (ref 80.0–100.0)
Monocytes Absolute: 0.3 10*3/uL (ref 0.1–1.0)
Monocytes Relative: 6 %
Neutro Abs: 2.3 10*3/uL (ref 1.7–7.7)
Neutrophils Relative %: 45 %
Platelets: 263 10*3/uL (ref 150–400)
RBC: 4.59 MIL/uL (ref 3.87–5.11)
RDW: 13.9 % (ref 11.5–15.5)
WBC: 4.9 10*3/uL (ref 4.0–10.5)
nRBC: 0 % (ref 0.0–0.2)

## 2020-08-16 LAB — FERRITIN: Ferritin: 14 ng/mL (ref 11–307)

## 2020-08-17 LAB — CEA: CEA: 3.1 ng/mL (ref 0.0–4.7)

## 2020-08-23 ENCOUNTER — Inpatient Hospital Stay (HOSPITAL_COMMUNITY): Payer: Medicare HMO | Attending: Hematology | Admitting: Hematology

## 2020-08-23 ENCOUNTER — Other Ambulatory Visit: Payer: Self-pay

## 2020-08-23 VITALS — BP 107/64 | HR 74 | Temp 98.5°F | Resp 18 | Wt 136.0 lb

## 2020-08-23 DIAGNOSIS — M810 Age-related osteoporosis without current pathological fracture: Secondary | ICD-10-CM | POA: Diagnosis not present

## 2020-08-23 DIAGNOSIS — C182 Malignant neoplasm of ascending colon: Secondary | ICD-10-CM | POA: Insufficient documentation

## 2020-08-23 DIAGNOSIS — R918 Other nonspecific abnormal finding of lung field: Secondary | ICD-10-CM | POA: Diagnosis not present

## 2020-08-23 DIAGNOSIS — C189 Malignant neoplasm of colon, unspecified: Secondary | ICD-10-CM | POA: Diagnosis not present

## 2020-08-23 DIAGNOSIS — F17210 Nicotine dependence, cigarettes, uncomplicated: Secondary | ICD-10-CM | POA: Insufficient documentation

## 2020-08-23 DIAGNOSIS — R69 Illness, unspecified: Secondary | ICD-10-CM | POA: Diagnosis not present

## 2020-08-23 NOTE — Progress Notes (Signed)
[address] [place], [place] [address]   CLINIC:  Medical Oncology/Hematology  PCP:  Alycia Rossetti, [place] / [place] [address] [phone]   REASON FOR VISIT:  Follow-up for colon cancer  PRIOR THERAPY: Right hemicolectomy on 04/09/2020  NGS Results: MSI--high; BRAF V600E; MLH1 hyper methylation positive  CURRENT THERAPY: Under work-up  BRIEF ONCOLOGIC HISTORY:  Oncology History  Cancer of ascending colon ([place])  05/10/2020 Genetic Testing   BRAF Mutation Analysis:     05/11/2020 Genetic Testing   MLH1 Promoter Methylation Analysis:       CANCER STAGING: Cancer Staging No matching staging information was found for the patient.  INTERVAL HISTORY:  [name], a 70 y.o. female, returns for routine follow-up of her colon cancer. Sara Coffey was last seen on 05/17/2020.   Today she is accompanied by her daughter and she reports feeling well. She denies having any recent infections, F/C, nosebleeds, hematuria or hematochezia. She is taking iron tablets daily and denies having constipation. She is also taking vitamin B12 daily.   REVIEW OF SYSTEMS:  Review of Systems  Constitutional: Positive for fatigue (75%). Negative for appetite change, chills and fever.  HENT:   Negative for nosebleeds.   Gastrointestinal: Negative for blood in stool and constipation.  Genitourinary: Negative for hematuria.   All other systems reviewed and are negative.   PAST MEDICAL/SURGICAL HISTORY:  Past Medical History:  Diagnosis Date  . Amnesia 04/17/2019  . Hemorrhoid 09/28/2013  . Hyperlipidemia   . Osteoporosis   . Syncope and collapse   . TGA (transient global amnesia) 04/18/2019  . TIA (transient ischemic attack) 04/17/2019  . Tobacco abuse    Past Surgical History:  Procedure Laterality Date  . BIOPSY  03/02/2020   Procedure: BIOPSY;  Surgeon: Rogene Houston, MD;  Location: [facility];  Service: Endoscopy;;  ascending  colon mass  . COLONOSCOPY WITH PROPOFOL N/A 03/02/2020   Procedure: COLONOSCOPY WITH PROPOFOL;  Surgeon: Rogene Houston, MD;  Location: [facility];  Service: Endoscopy;  Laterality: N/A;  1055  . KNEE SX    . LAPAROSCOPIC PARTIAL COLECTOMY N/A 04/09/2020   Procedure: LAPAROSCOPIC PARTIAL COLECTOMY;  Surgeon: Virl Cagey, MD;  Location: [facility];  Service: General;  Laterality: N/A;  . left eye surgery     . POLYPECTOMY  03/02/2020   Procedure: POLYPECTOMY;  Surgeon: Rogene Houston, MD;  Location: [facility];  Service: Endoscopy;;  . TUBAL LIGATION      SOCIAL HISTORY:  Social History   Socioeconomic History  . Marital status: Married    Spouse name: Not on file  . Number of children: 3  . Years of education: Not on file  . Highest education level: Not on file  Occupational History  . Occupation: retired  Tobacco Use  . Smoking status: Current Every Day Smoker    Packs/day: 1.00    Years: 40.00    Pack years: 40.00    Types: Cigarettes  . Smokeless tobacco: Never Used  Vaping Use  . Vaping Use: Never used  Substance and Sexual Activity  . Alcohol use: No  . Drug use: No  . Sexual activity: Not Currently    Birth control/protection: Surgical, Post-menopausal    Comment: tubal  Other Topics Concern  . Not on file  Social History Narrative  . Not on file   Social Determinants of Health   Financial Resource Strain:  Low Risk   . Difficulty of Paying Living Expenses: Not hard at all  Food Insecurity: No Food Insecurity  . Worried About Charity fundraiser in the Last Year: Never true  . Ran Out of Food in the Last Year: Never true  Transportation Needs: No Transportation Needs  . Lack of Transportation (Medical): No  . Lack of Transportation (Non-Medical): No  Physical Activity: Inactive  . Days of Exercise per Week: 0 days  . Minutes of Exercise per Session: 0 min  Stress: No Stress Concern Present  . Feeling of Stress : Only a little  Social  Connections: Moderately Isolated  . Frequency of Communication with Friends and Family: More than three times a week  . Frequency of Social Gatherings with Friends and Family: Once a week  . Attends Religious Services: Never  . Active Member of Clubs or Organizations: No  . Attends Archivist Meetings: Never  . Marital Status: Married  Human resources officer Violence: Not At Risk  . Fear of Current or Ex-Partner: No  . Emotionally Abused: No  . Physically Abused: No  . Sexually Abused: No    FAMILY HISTORY:  Family History  Problem Relation Age of Onset  . Heart disease Father   . Hypertension Father   . Diabetes Father   . Cancer Brother   . Healthy Son   . Healthy Son   . Healthy Daughter   . Ulcers Mother   . Kidney failure Maternal Grandfather   . Throat cancer Paternal Grandfather   . Cancer Brother   . Breast cancer Neg Hx     CURRENT MEDICATIONS:  Current Outpatient Medications  Medication Sig Dispense Refill  . Calcium Carb-Cholecalciferol (CALCIUM-VITAMIN D) 600-400 MG-UNIT TABS Take 1 tablet by mouth daily.    . calcium carbonate (TUMS - DOSED IN MG ELEMENTAL CALCIUM) 500 MG chewable tablet Chew 1 tablet by mouth as needed for indigestion or heartburn.    . Ferrous Sulfate (IRON) 325 (65 Fe) MG TABS Take 1 tablet by mouth daily.    . Melatonin 5 MG TABS Take 5 mg by mouth at bedtime as needed (sleep).     . Omega-3 Fatty Acids (FISH OIL) 1000 MG CAPS Take 2,000 mg by mouth in the morning and at bedtime.    . rosuvastatin (CRESTOR) 5 MG tablet Take 1 tablet (5 mg total) by mouth daily. 30 tablet 6  . Acetaminophen (TYLENOL) 325 MG CAPS  (Patient not taking: Reported on 08/23/2020)    . acetaminophen (TYLENOL) 500 MG tablet Take 1,000 mg by mouth every 6 (six) hours as needed for headache.  (Patient not taking: Reported on 08/23/2020)     No current facility-administered medications for this visit.    ALLERGIES:  No Known Allergies  PHYSICAL EXAM:   Performance status (ECOG): 0 - Asymptomatic  Vitals:   08/23/20 1453  BP: 107/64  Pulse: 74  Resp: 18  Temp: 98.5 F (36.9 C)  SpO2: 98%   Wt Readings from Last 3 Encounters:  08/23/20 136 lb (61.7 kg)  05/24/20 131 lb (59.4 kg)  05/17/20 131 lb 3.2 oz (59.5 kg)   Physical Exam Vitals reviewed.  Constitutional:      Appearance: Normal appearance.  Cardiovascular:     Rate and Rhythm: Normal rate and regular rhythm.     Pulses: Normal pulses.     Heart sounds: Normal heart sounds.  Pulmonary:     Effort: Pulmonary effort is normal.  Breath sounds: Normal breath sounds.  Abdominal:     Palpations: Abdomen is soft. There is no hepatomegaly, splenomegaly or mass.     Tenderness: There is no abdominal tenderness.     Hernia: No hernia is present.  Musculoskeletal:     Right lower leg: No edema.     Left lower leg: No edema.  Neurological:     General: No focal deficit present.     Mental Status: She is alert and oriented to person, place, and time.  Psychiatric:        Mood and Affect: Mood normal.        Behavior: Behavior normal.      LABORATORY DATA:  I have reviewed the labs as listed.  CBC Latest Ref Rng & Units 08/16/2020 05/01/2020 04/19/2020  WBC 4.0 - 10.5 K/uL 4.9 5.0 6.3  Hemoglobin 12.0 - 15.0 g/dL 13.9 9.4(L) 7.9(L)  Hematocrit 36.0 - 46.0 % 42.6 31.0(L) 25.1(L)  Platelets 150 - 400 K/uL 263 411(H) 523(H)   CMP Latest Ref Rng & Units 08/16/2020 05/15/2020 04/12/2020  Glucose 70 - 99 mg/dL 124(H) - 88  BUN 8 - 23 mg/dL 11 - 9  Creatinine 0.44 - 1.00 mg/dL 0.83 0.80 0.57  Sodium 135 - 145 mmol/L 141 - 137  Potassium 3.5 - 5.1 mmol/L 4.9 - 3.9  Chloride 98 - 111 mmol/L 103 - 107  CO2 22 - 32 mmol/L 28 - 24  Calcium 8.9 - 10.3 mg/dL 9.7 - 8.2(L)  Total Protein 6.5 - 8.1 g/dL 7.2 - -  Total Bilirubin 0.3 - 1.2 mg/dL 0.4 - -  Alkaline Phos 38 - 126 U/L 58 - -  AST 15 - 41 U/L 20 - -  ALT 0 - 44 U/L 15 - -   Lab Results  Component Value Date   TIBC  415 08/16/2020   TIBC 464 (H) 05/01/2020   FERRITIN 14 08/16/2020   FERRITIN 24 05/01/2020   IRONPCTSAT 20 08/16/2020   IRONPCTSAT 4 (L) 05/01/2020   Lab Results  Component Value Date   CEA1 3.1 08/16/2020   CEA1 3.6 04/06/2020    DIAGNOSTIC IMAGING:  I have independently reviewed the scans and discussed with the patient. No results found.   ASSESSMENT:  1. Stage II (T3N0) right colon adenocarcinoma: -Colonoscopy on 03/02/2020 biopsy of the ascending colon mass consistent with adenocarcinoma. Sessile serrated polyp in the hepatic flexure, tubular adenoma in the hepatic, transverse and proximal colon. Tubular adenoma in the rectum. -CTAP on 03/14/2020 shows 4.6 cm intraluminal mass in the right colon consistent with known colon cancer. No evidence of metastatic disease in the abdomen or pelvis. -Preoperative CEA on 04/06/2020 was 3.6. -Right hemicolectomy on 04/09/2020 with pathology showing 3.5 cm moderately differentiated adenocarcinoma, separate focus of invasive adenocarcinoma 0.5 cm arising in a tubular adenoma with high-grade dysplasia, 0/32 lymph nodes involved,no perforation, no lymphovascular/perineural invasion, margins negative, PT3PN0. -MMR shows loss of nuclear expression of MLH1 and PMS2. MSI was high. -BRAF V600 and MLH1 hypermethylation positive.  Indicates sporadic nature. -CT chest with contrast on 05/15/2020 with no metastatic disease.  3 mm pulmonary nodule of the dependent right lower lobe and 7 mm groundglass opacity of the dependent superior segment of the left lower lobe.  2. Social/family history: -She is a retired Psychiatric nurse. -Current active smoker, 1 pack/day for 63 years. -One brother had prostate cancer. Another brother had multiple different type of cancers, known to the patient. Paternal grandfather had throat cancer and a maternal  aunt had stomach cancer.   PLAN:  1. Stage II (T3N0) right colon adenocarcinoma: -She does not have any GI  symptoms or change in bowel habits or bleeding. -We reviewed labs which showed normal LFTs.  CEA is 3.1. -Recommend follow-up in 3 months with repeat CT of the abdomen and pelvis.  2. Normocytic anemia: -Hemoglobin improved to 13.9 from 9.4.  Ferritin is 14. -Cut back iron tablet every other day.  3.  Lung nodule: -CT chest with contrast on 05/15/2020 shows a 3 mm pulmonary nodule of the dependent right lower lobe nonspecific.  Nonspecific 7 mm groundglass opacity of the dependent superior segment of left lower lobe likely infectious/inflammatory. -Plan to repeat CT chest in 6 months.   Orders placed this encounter:  No orders of the defined types were placed in this encounter.    Derek Jack, MD [place] [phone]   I, Milinda Antis, am acting as a scribe for Dr. Sanda Linger.  I, Derek Jack MD, have reviewed the above documentation for accuracy and completeness, and I agree with the above.

## 2020-08-23 NOTE — Patient Instructions (Signed)
[place] at [facility] Discharge Instructions  You were seen today by Dr. Delton Coombes. He went over your recent results. You may start taking an iron tablet every other day. You will be scheduled for a CT scan of your abdomen before your next visit. Dr. Delton Coombes will see you back in 3 months for labs and follow up.   Thank you for choosing [place] at [facility] to provide your oncology and hematology care.  To afford each patient quality time with our provider, please arrive at least 15 minutes before your scheduled appointment time.   If you have a lab appointment with the [place] please come in thru the Main Entrance and check in at the main information desk  You need to re-schedule your appointment should you arrive 10 or more minutes late.  We strive to give you quality time with our providers, and arriving late affects you and other patients whose appointments are after yours.  Also, if you no show three or more times for appointments you may be dismissed from the clinic at the providers discretion.     Again, thank you for choosing [facility].  Our hope is that these requests will decrease the amount of time that you wait before being seen by our physicians.       _____________________________________________________________  Should you have questions after your visit to  [facility], please contact our office at [phone] between the hours of 8:00 a.m. and 4:30 p.m.  Voicemails left after 4:00 p.m. will not be returned until the following business day.  For prescription refill requests, have your pharmacy contact our office and allow 72 hours.    Cancer Center Support Programs:   > Cancer Support Group  2nd Tuesday of the month 1pm-2pm, Journey Room

## 2020-09-19 ENCOUNTER — Other Ambulatory Visit: Payer: Self-pay

## 2020-09-19 ENCOUNTER — Ambulatory Visit (INDEPENDENT_AMBULATORY_CARE_PROVIDER_SITE_OTHER): Payer: Medicare HMO | Admitting: Family Medicine

## 2020-09-19 ENCOUNTER — Encounter: Payer: Self-pay | Admitting: Family Medicine

## 2020-09-19 ENCOUNTER — Ambulatory Visit: Payer: Medicare HMO | Admitting: Nurse Practitioner

## 2020-09-19 VITALS — BP 126/70 | HR 88 | Temp 98.1°F | Resp 16 | Ht 64.0 in | Wt 135.2 lb

## 2020-09-19 DIAGNOSIS — M810 Age-related osteoporosis without current pathological fracture: Secondary | ICD-10-CM | POA: Diagnosis not present

## 2020-09-19 DIAGNOSIS — Z23 Encounter for immunization: Secondary | ICD-10-CM

## 2020-09-19 DIAGNOSIS — Z1231 Encounter for screening mammogram for malignant neoplasm of breast: Secondary | ICD-10-CM | POA: Diagnosis not present

## 2020-09-19 DIAGNOSIS — Z1159 Encounter for screening for other viral diseases: Secondary | ICD-10-CM

## 2020-09-19 DIAGNOSIS — E7800 Pure hypercholesterolemia, unspecified: Secondary | ICD-10-CM | POA: Diagnosis not present

## 2020-09-19 DIAGNOSIS — Z720 Tobacco use: Secondary | ICD-10-CM | POA: Diagnosis not present

## 2020-09-19 NOTE — Assessment & Plan Note (Addendum)
counseled on cessation She has cut back Has CT scheduled for lung nodule PNA 23 vaccine given

## 2020-09-19 NOTE — Assessment & Plan Note (Signed)
Continue statin drug Recheck lipids

## 2020-09-19 NOTE — Patient Instructions (Addendum)
Okay to switch to [place] - f/u 6 MONTHS FOR PHYSICAL Also husband Sara Coffey put on Sara Coffey We will call with lab results Schedule your mammogram in March

## 2020-09-19 NOTE — Assessment & Plan Note (Signed)
At this time calcium/vitamin D Weight bearing exercise Hold on bisphosphonate recent cancer , currently still in surveillance/treatment

## 2020-09-19 NOTE — Progress Notes (Signed)
   Subjective:    Patient ID: Sara Coffey, female    DOB: May 17, 1951, 70 y.o.   MRN: [id number]  Patient presents for Follow-up (Pt doing well, no concerns today)  Pt here to f/u chronic medical problems She was last evaluated by NP that has left the practice  She is followed by oncology by colon cancer had partial colectomy. She has genetic testing for family, has repeat CT abdomen scheduled  She also has CT chest which is being rechecked for lung nodule  Smokes 1/2 ppd   HLD- she is on crestor and fish oil , she has family history of heart disease   Iron def anemia in setting of colon cancer, taking iron every other day   Chronic insomnia- taking 10mg  of melatonin  Primary GI - Dr. Collene Mares Primary Surgeon- Dr. Constance Haw  Due for repeat Mammogram - [facility] at [facility]  Osteoporosis - taking calcium and VItamin D , exercises regulary  Review Of Systems:  GEN- denies fatigue, fever, weight loss,weakness, recent illness HEENT- denies eye drainage, change in vision, nasal discharge, CVS- denies chest pain, palpitations RESP- denies SOB, cough, wheeze ABD- denies N/V, change in stools, abd pain GU- denies dysuria, hematuria, dribbling, incontinence MSK- denies joint pain, muscle aches, injury Neuro- denies headache, dizziness, syncope, seizure activity       Objective:    BP 126/70   Pulse 88   Temp 98.1 F (36.7 C) (Temporal)   Resp 16   Ht 5\' 4"  (1.626 m)   Wt 135 lb 3.2 oz (61.3 kg)   SpO2 98%   BMI 23.21 kg/m  GEN- NAD, alert and oriented x3 HEENT- PERRL, EOMI, non injected sclera, pink conjunctiva, MMM, oropharynx clear Neck- Supple, no thyromegaly CVS- RRR, no murmur RESP-CTAB ABD-NABS,soft,NT,ND EXT- No edema Pulses- Radial, DP- 2+        Assessment & Plan:      Problem List Items Addressed This Visit      Unprioritized   Hyperlipidemia    Continue statin drug Recheck lipids      Relevant Orders   Lipid panel   Osteoporosis    At this time  calcium/vitamin D Weight bearing exercise Hold on bisphosphonate recent cancer , currently still in surveillance/treatment      Tobacco abuse    counseled on cessation She has cut back Has CT scheduled for lung nodule PNA 23 vaccine given        Other Visit Diagnoses    Encounter for screening mammogram for malignant neoplasm of breast    -  Primary   Relevant Orders   MM 3D SCREEN BREAST BILATERAL   Need for hepatitis C screening test       Relevant Orders   Hepatitis C antibody      Note: This dictation was prepared with Dragon dictation along with smaller phrase technology. Any transcriptional errors that result from this process are unintentional.

## 2020-09-21 LAB — LIPID PANEL
Cholesterol: 202 mg/dL — ABNORMAL HIGH (ref <200)
HDL: 63 mg/dL (ref >=50)
LDL Cholesterol (Calc): 106 mg/dL (calc) — ABNORMAL HIGH
Non-HDL Cholesterol (Calc): 139 mg/dL (calc) — ABNORMAL HIGH (ref <130)
Total CHOL/HDL Ratio: 3.2 (calc) (ref <5.0)
Triglycerides: 213 mg/dL — ABNORMAL HIGH (ref <150)

## 2020-09-21 LAB — HEPATITIS C ANTIBODY
Hepatitis C Ab: REACTIVE — AB
SIGNAL TO CUT-OFF: 24.9 — ABNORMAL HIGH (ref <1.00)

## 2020-09-21 LAB — HCV RNA,QUANTITATIVE REAL TIME PCR
HCV Quantitative Log: <1.18 Log IU/mL
HCV RNA, PCR, QN: <15 IU/mL

## 2020-10-29 ENCOUNTER — Telehealth: Payer: Self-pay | Admitting: Family Medicine

## 2020-10-29 ENCOUNTER — Other Ambulatory Visit: Payer: Self-pay

## 2020-10-29 DIAGNOSIS — E785 Hyperlipidemia, unspecified: Secondary | ICD-10-CM

## 2020-10-29 MED ORDER — ROSUVASTATIN CALCIUM 5 MG PO TABS: 5.0000 mg | ORAL_TABLET | Freq: Every day | ORAL | 1 refills | Status: DC

## 2020-10-29 NOTE — Telephone Encounter (Signed)
Pt called with concerns about refills on this med rosuvastatin (CRESTOR) 5 MG tablet   Wasn't sure if she should continue to take since bottle has no refills on label. Please call to confirm.  Cb#:[phone]

## 2020-11-14 ENCOUNTER — Other Ambulatory Visit: Payer: Self-pay

## 2020-11-14 ENCOUNTER — Ambulatory Visit (HOSPITAL_COMMUNITY)
Admission: RE | Admit: 2020-11-14 | Discharge: 2020-11-14 | Disposition: A | Discharge status: Home / Self Care | Payer: Medicare HMO | Source: Ambulatory Visit | Attending: Hematology | Admitting: Hematology

## 2020-11-14 ENCOUNTER — Inpatient Hospital Stay (HOSPITAL_COMMUNITY): Payer: Medicare HMO | Attending: Hematology

## 2020-11-14 DIAGNOSIS — Z809 Family history of malignant neoplasm, unspecified: Secondary | ICD-10-CM | POA: Diagnosis not present

## 2020-11-14 DIAGNOSIS — Z808 Family history of malignant neoplasm of other organs or systems: Secondary | ICD-10-CM | POA: Insufficient documentation

## 2020-11-14 DIAGNOSIS — Z800 Family history of malignant neoplasm of digestive organs: Secondary | ICD-10-CM | POA: Diagnosis not present

## 2020-11-14 DIAGNOSIS — R5383 Other fatigue: Secondary | ICD-10-CM | POA: Diagnosis not present

## 2020-11-14 DIAGNOSIS — C189 Malignant neoplasm of colon, unspecified: Secondary | ICD-10-CM

## 2020-11-14 DIAGNOSIS — F17210 Nicotine dependence, cigarettes, uncomplicated: Secondary | ICD-10-CM | POA: Insufficient documentation

## 2020-11-14 DIAGNOSIS — Z9049 Acquired absence of other specified parts of digestive tract: Secondary | ICD-10-CM | POA: Diagnosis not present

## 2020-11-14 DIAGNOSIS — R69 Illness, unspecified: Secondary | ICD-10-CM | POA: Diagnosis not present

## 2020-11-14 DIAGNOSIS — D649 Anemia, unspecified: Secondary | ICD-10-CM | POA: Insufficient documentation

## 2020-11-14 DIAGNOSIS — R918 Other nonspecific abnormal finding of lung field: Secondary | ICD-10-CM | POA: Insufficient documentation

## 2020-11-14 DIAGNOSIS — K5730 Diverticulosis of large intestine without perforation or abscess without bleeding: Secondary | ICD-10-CM | POA: Diagnosis not present

## 2020-11-14 DIAGNOSIS — Z8042 Family history of malignant neoplasm of prostate: Secondary | ICD-10-CM | POA: Diagnosis not present

## 2020-11-14 DIAGNOSIS — I700 Atherosclerosis of aorta: Secondary | ICD-10-CM | POA: Diagnosis not present

## 2020-11-14 DIAGNOSIS — C182 Malignant neoplasm of ascending colon: Secondary | ICD-10-CM | POA: Diagnosis not present

## 2020-11-14 LAB — CBC WITH DIFFERENTIAL/PLATELET
Abs Immature Granulocytes: 0.02 10*3/uL (ref 0.00–0.07)
Basophils Absolute: 0.1 10*3/uL (ref 0.0–0.1)
Basophils Relative: 1 %
Eosinophils Absolute: 0.3 10*3/uL (ref 0.0–0.5)
Eosinophils Relative: 5 %
HCT: 44.7 % (ref 36.0–46.0)
Hemoglobin: 14.8 g/dL (ref 12.0–15.0)
Immature Granulocytes: 0 %
Lymphocytes Relative: 44 %
Lymphs Abs: 2.5 10*3/uL (ref 0.7–4.0)
MCH: 31.7 pg (ref 26.0–34.0)
MCHC: 33.1 g/dL (ref 30.0–36.0)
MCV: 95.7 fL (ref 80.0–100.0)
Monocytes Absolute: 0.4 10*3/uL (ref 0.1–1.0)
Monocytes Relative: 7 %
Neutro Abs: 2.5 10*3/uL (ref 1.7–7.7)
Neutrophils Relative %: 43 %
Platelets: 264 10*3/uL (ref 150–400)
RBC: 4.67 MIL/uL (ref 3.87–5.11)
RDW: 12.4 % (ref 11.5–15.5)
WBC: 5.8 10*3/uL (ref 4.0–10.5)
nRBC: 0 % (ref 0.0–0.2)

## 2020-11-14 LAB — FERRITIN: Ferritin: 19 ng/mL (ref 11–307)

## 2020-11-14 LAB — COMPREHENSIVE METABOLIC PANEL
ALT: 21 U/L (ref 0–44)
AST: 22 U/L (ref 15–41)
Albumin: 4.6 g/dL (ref 3.5–5.0)
Alkaline Phosphatase: 66 U/L (ref 38–126)
Anion gap: 10 (ref 5–15)
BUN: 14 mg/dL (ref 8–23)
CO2: 28 mmol/L (ref 22–32)
Calcium: 10.4 mg/dL — ABNORMAL HIGH (ref 8.9–10.3)
Chloride: 104 mmol/L (ref 98–111)
Creatinine, Ser: 0.74 mg/dL (ref 0.44–1.00)
GFR, Estimated: >60 mL/min (ref >60)
Glucose, Bld: 96 mg/dL (ref 70–99)
Potassium: 4.3 mmol/L (ref 3.5–5.1)
Sodium: 142 mmol/L (ref 135–145)
Total Bilirubin: 0.5 mg/dL (ref 0.3–1.2)
Total Protein: 7.9 g/dL (ref 6.5–8.1)

## 2020-11-14 LAB — IRON AND TIBC
Iron: 54 ug/dL (ref 28–170)
Saturation Ratios: 12 % (ref 10.4–31.8)
TIBC: 447 ug/dL (ref 250–450)
UIBC: 393 ug/dL

## 2020-11-14 LAB — VITAMIN B12: Vitamin B-12: 854 pg/mL (ref 180–914)

## 2020-11-14 MED ORDER — IOHEXOL 300 MG/ML  SOLN
100.0000 mL | Freq: Once | INTRAMUSCULAR | Status: AC | PRN
  Administered 2020-11-14: 100 mL via INTRAVENOUS

## 2020-11-15 LAB — CEA: CEA: 3.5 ng/mL (ref 0.0–4.7)

## 2020-11-21 ENCOUNTER — Inpatient Hospital Stay (HOSPITAL_COMMUNITY): Payer: Medicare HMO | Attending: Hematology | Admitting: Hematology

## 2020-11-21 ENCOUNTER — Other Ambulatory Visit: Payer: Self-pay

## 2020-11-21 VITALS — BP 110/55 | HR 72 | Temp 96.8°F | Resp 18 | Wt 136.0 lb

## 2020-11-21 DIAGNOSIS — F17210 Nicotine dependence, cigarettes, uncomplicated: Secondary | ICD-10-CM | POA: Diagnosis not present

## 2020-11-21 DIAGNOSIS — C182 Malignant neoplasm of ascending colon: Secondary | ICD-10-CM | POA: Diagnosis not present

## 2020-11-21 DIAGNOSIS — R69 Illness, unspecified: Secondary | ICD-10-CM | POA: Diagnosis not present

## 2020-11-21 DIAGNOSIS — D509 Iron deficiency anemia, unspecified: Secondary | ICD-10-CM | POA: Insufficient documentation

## 2020-11-21 DIAGNOSIS — R918 Other nonspecific abnormal finding of lung field: Secondary | ICD-10-CM | POA: Diagnosis not present

## 2020-11-21 DIAGNOSIS — C189 Malignant neoplasm of colon, unspecified: Secondary | ICD-10-CM | POA: Diagnosis not present

## 2020-11-21 DIAGNOSIS — R911 Solitary pulmonary nodule: Secondary | ICD-10-CM

## 2020-11-21 NOTE — Progress Notes (Signed)
Sara Coffey, [place] [address]   CLINIC:  Medical Oncology/Hematology  PCP:  Sara Coffey, [place] / [address] [place] [address] [phone]   REASON FOR VISIT:  Follow-up for colon cancer  PRIOR THERAPY: Right hemicolectomy on 04/09/2020  NGS Results: [employer] MSI--high; BRAF V600E; MLH1 hyper methylation positive  CURRENT THERAPY: Surveillance  BRIEF ONCOLOGIC HISTORY:  Oncology History  Cancer of ascending colon ([place])  05/10/2020 Genetic Testing   BRAF Mutation Analysis:     05/11/2020 Genetic Testing   MLH1 Promoter Methylation Analysis:       CANCER STAGING: Cancer Staging No matching staging information was found for the patient.  INTERVAL HISTORY:  Sara Coffey, a 70 y.o. female, returns for routine follow-up of her colon cancer. Sara Coffey was last seen on 08/23/2020.   Today she is accompanied by her daughter and she reports feeling well. Her appetite is excellent and she denies having N/V/D/C or abdominal pain or new pains. She is taking calcium-vitamin D daily. She denies having a history of diverticulitis.  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 She continues smoking 1 PPD.   REVIEW OF SYSTEMS:  Review of Systems  Constitutional: Negative for appetite change and fatigue.  Gastrointestinal: Negative for constipation, diarrhea, nausea and vomiting.  All other systems reviewed and are negative.   PAST MEDICAL/SURGICAL HISTORY:  Past Medical History:  Diagnosis Date  . Amnesia 04/17/2019  . Hemorrhoid 09/28/2013  . Hyperlipidemia   . Osteoporosis   . Syncope and collapse   . TGA (transient global amnesia) 04/18/2019  . TIA (transient ischemic attack) 04/17/2019  . Tobacco abuse    Past Surgical History:  Procedure Laterality Date  . BIOPSY  03/02/2020   Procedure: BIOPSY;  Surgeon: Rogene Houston, MD;  [facility];  Service:  Endoscopy;;  ascending colon mass  . COLONOSCOPY WITH PROPOFOL N/A 03/02/2020   Procedure: COLONOSCOPY WITH PROPOFOL;  Surgeon: Rogene Houston, MD;  Location: [facility];  Service: Endoscopy;  Laterality: N/A;  1055  . KNEE SX    . LAPAROSCOPIC PARTIAL COLECTOMY N/A 04/09/2020   Procedure: LAPAROSCOPIC PARTIAL COLECTOMY;  Surgeon: Virl Cagey, MD;  Location: [facility];  Service: General;  Laterality: N/A;  . left eye surgery     . POLYPECTOMY  03/02/2020   Procedure: POLYPECTOMY;  Surgeon: Rogene Houston, MD;  Location: [facility];  Service: Endoscopy;;  . TUBAL LIGATION      SOCIAL HISTORY:  Social History   Socioeconomic History  . Marital status: Married    Spouse name: Not on file  . Number of children: 3  . Years of education: Not on file  . Highest education level: Not on file  Occupational History  . Occupation: retired  Tobacco Use  . Smoking status: Current Every Day Smoker    Packs/day: 1.00    Years: 40.00    Pack years: 40.00    Types: Cigarettes  . Smokeless tobacco: Never Used  Vaping Use  . Vaping Use: Never used  Substance and Sexual Activity  . Alcohol use: No  . Drug use: No  . Sexual activity: Not Currently    Birth control/protection: Surgical, Post-menopausal    Comment: tubal  Other Topics Concern  . Not on file  Social History Narrative  . Not on file   Social Determinants of Health  Financial Resource Strain: Low Risk   . Difficulty of Paying Living Expenses: Not hard at all  Food Insecurity: No Food Insecurity  . Worried About Charity fundraiser in the Last Year: Never true  . Ran Out of Food in the Last Year: Never true  Transportation Needs: No Transportation Needs  . Lack of Transportation (Medical): No  . Lack of Transportation (Non-Medical): No  Physical Activity: Inactive  . Days of Exercise per Week: 0 days  . Minutes of Exercise per Session: 0 min  Stress: No Stress Concern Present  . Feeling of Stress : Only a  little  Social Connections: Moderately Isolated  . Frequency of Communication with Friends and Family: More than three times a week  . Frequency of Social Gatherings with Friends and Family: Once a week  . Attends Religious Services: Never  . Active Member of Clubs or Organizations: No  . Attends Archivist Meetings: Never  . Marital Status: Married  Human resources officer Violence: Not At Risk  . Fear of Current or Ex-Partner: No  . Emotionally Abused: No  . Physically Abused: No  . Sexually Abused: No    FAMILY HISTORY:  Family History  Problem Relation Age of Onset  . Heart disease Father   . Hypertension Father   . Diabetes Father   . Cancer Brother   . Healthy Son   . Healthy Son   . Healthy Daughter   . Ulcers Mother   . Kidney failure Maternal Grandfather   . Throat cancer Paternal Grandfather   . Cancer Brother   . Breast cancer Neg Hx     CURRENT MEDICATIONS:  Current Outpatient Medications  Medication Sig Dispense Refill  . Calcium Carb-Cholecalciferol (CALCIUM-VITAMIN D) 600-400 MG-UNIT TABS Take 1 tablet by mouth daily.    . calcium carbonate (TUMS - DOSED IN MG ELEMENTAL CALCIUM) 500 MG chewable tablet Chew 1 tablet by mouth as needed for indigestion or heartburn.    . Ferrous Sulfate (IRON) 325 (65 Fe) MG TABS Take 1 tablet by mouth. 1 every other day    . Melatonin 5 MG TABS Take 5 mg by mouth at bedtime as needed (sleep).     . Omega-3 Fatty Acids (FISH OIL) 1000 MG CAPS Take 2,000 mg by mouth in the morning and at bedtime.    . rosuvastatin (CRESTOR) 5 MG tablet Take 1 tablet (5 mg total) by mouth daily. 90 tablet 1   No current facility-administered medications for this visit.    ALLERGIES:  No Known Allergies  PHYSICAL EXAM:  Performance status (ECOG): 0 - Asymptomatic  Vitals:   11/21/20 1141  BP: (!) 110/55  Pulse: 72  Resp: 18  Temp: (!) 96.8 F (36 C)  SpO2: 99%   Wt Readings from Last 3 Encounters:  11/21/20 136 lb (61.7 kg)   09/19/20 135 lb 3.2 oz (61.3 kg)  08/23/20 136 lb (61.7 kg)   Physical Exam Vitals reviewed.  Constitutional:      Appearance: Normal appearance.  Cardiovascular:     Rate and Rhythm: Normal rate and regular rhythm.     Pulses: Normal pulses.     Heart sounds: Normal heart sounds.  Pulmonary:     Effort: Pulmonary effort is normal.     Breath sounds: Normal breath sounds.  Abdominal:     Palpations: Abdomen is soft. There is no hepatomegaly, splenomegaly or mass.     Tenderness: There is no abdominal tenderness.     Hernia:  No hernia is present.  Musculoskeletal:     Right lower leg: No edema.     Left lower leg: No edema.  Neurological:     General: No focal deficit present.     Mental Status: She is alert and oriented to person, place, and time.  Psychiatric:        Mood and Affect: Mood normal.        Behavior: Behavior normal.      LABORATORY DATA:  I have reviewed the labs as listed.  CBC Latest Ref Rng & Units 11/14/2020 08/16/2020 05/01/2020  WBC 4.0 - 10.5 K/uL 5.8 4.9 5.0  Hemoglobin 12.0 - 15.0 g/dL 14.8 13.9 9.4(L)  Hematocrit 36.0 - 46.0 % 44.7 42.6 31.0(L)  Platelets 150 - 400 K/uL 264 263 411(H)   CMP Latest Ref Rng & Units 11/14/2020 08/16/2020 05/15/2020  Glucose 70 - 99 mg/dL 96 124(H) -  BUN 8 - 23 mg/dL 14 11 -  Creatinine 0.44 - 1.00 mg/dL 0.74 0.83 0.80  Sodium 135 - 145 mmol/L 142 141 -  Potassium 3.5 - 5.1 mmol/L 4.3 4.9 -  Chloride 98 - 111 mmol/L 104 103 -  CO2 22 - 32 mmol/L 28 28 -  Calcium 8.9 - 10.3 mg/dL 10.4(H) 9.7 -  Total Protein 6.5 - 8.1 g/dL 7.9 7.2 -  Total Bilirubin 0.3 - 1.2 mg/dL 0.5 0.4 -  Alkaline Phos 38 - 126 U/L 66 58 -  AST 15 - 41 U/L 22 20 -  ALT 0 - 44 U/L 21 15 -   Lab Results  Component Value Date   CEA1 3.5 11/14/2020   CEA1 3.1 08/16/2020   CEA1 3.6 04/06/2020    DIAGNOSTIC IMAGING:  I have independently reviewed the scans and discussed with the patient. CT Abdomen Pelvis W Contrast  Result Date:  11/14/2020 CLINICAL DATA:  Colorectal cancer surveillance. EXAM: CT ABDOMEN AND PELVIS WITH CONTRAST TECHNIQUE: Multidetector CT imaging of the abdomen and pelvis was performed using the standard protocol following bolus administration of intravenous contrast. CONTRAST:  165m OMNIPAQUE IOHEXOL 300 MG/ML  SOLN COMPARISON:  None. FINDINGS: Lower chest: Lung bases are clear. Hepatobiliary: No focal hepatic lesion. No biliary duct dilatation. Common bile duct is normal. Pancreas: Pancreas is normal. No ductal dilatation. No pancreatic inflammation. Spleen: Normal spleen Adrenals/urinary tract: Adrenal glands and kidneys are normal. The ureters and bladder normal. Stomach/Bowel: Stomach, duodenum small-bowel normal. Post partial RIGHT hemicolectomy. Distal colon rectosigmoid colon normal. There several diverticula sigmoid colon. Vascular/Lymphatic: Abdominal aorta is normal caliber with atherosclerotic calcification. There is no retroperitoneal or periportal lymphadenopathy. No pelvic lymphadenopathy. Reproductive: Uterus and adnexa unremarkable. Other: No peritoneal disease Musculoskeletal: No aggressive osseous lesion. IMPRESSION: 1. No evidence of colorectal carcinoma recurrence or metastasis. 2. Post partial RIGHT hemicolectomy without complication. 3. Mild LEFT colon diverticulosis without evidence diverticulitis. 4.  Aortic Atherosclerosis (ICD10-I70.0). Electronically Signed   By: SSuzy BouchardM.D.   On: 11/14/2020 16:34     ASSESSMENT:  1. Stage II (T3N0) right colon adenocarcinoma: -Colonoscopy on 03/02/2020 biopsy of the ascending colon mass consistent with adenocarcinoma. Sessile serrated polyp in the hepatic flexure, tubular adenoma in the hepatic, transverse and proximal colon. Tubular adenoma in the rectum. -CTAP on 03/14/2020 shows 4.6 cm intraluminal mass in the right colon consistent with known colon cancer. No evidence of metastatic disease in the abdomen or pelvis. -Preoperative CEA on  04/06/2020 was 3.6. -Right hemicolectomy on 04/09/2020 with pathology showing 3.5 cm moderately differentiated adenocarcinoma, separate focus  of invasive adenocarcinoma 0.5 cm arising in a tubular adenoma with high-grade dysplasia, 0/32 lymph nodes involved,no perforation, no lymphovascular/perineural invasion, margins negative, PT3PN0. -MMR shows loss of nuclear expression of MLH1 and PMS2. MSI was high. -BRAF V600 andMLH1 hypermethylation positive.  Indicates sporadic nature. -CT chest with contrast on 05/15/2020 with no metastatic disease. 3 mm pulmonary nodule of the dependent right lower lobe and 7 mm groundglass opacity of the dependent superior segment of the left lower lobe.  2. Social/family history: -She is a retired Psychiatric nurse. -Current active smoker, 1 pack/day for 20 years. -One brother had prostate cancer. Another brother had multiple different type of cancers, known to the patient. Paternal grandfather had throat cancer and a maternal aunt had stomach cancer.   PLAN:  1. Stage II (T3N0) right colon adenocarcinoma: -She does not have any change in bowel movements or bleeding per rectum. -Reviewed labs which showed normal CBC and LFTs.  CEA was 3.5. -Reviewed CTAP on 11/14/2020 which showed no evidence of recurrence. -She has mild hypercalcemia as she is on calcium and vitamin D supplements.  We will closely monitor. -RTC 3 months with repeat CEA.  She will be referred to GI for colonoscopy 1 year from the time of diagnosis.  2. Normocytic anemia: -Continue iron tablet every other day.  Hemoglobin improved 14.8.  Ferritin is 19.  3. Lung nodule: -CT chest on 05/07/2020 showed 3 mm lung nodule in the right lower lobe and 7 mm groundglass opacity in the left lower lobe. -She is current active smoker. -Plan to repeat CT chest in 3 months.   Orders placed this encounter:  Orders Placed This Encounter  Procedures  . CBC with Differential/Platelet  . Comprehensive  metabolic panel  . CEA     Derek Jack, MD [place] [phone]   I, Milinda Antis, am acting as a scribe for Dr. Sanda Linger.  I, Derek Jack MD, have reviewed the above documentation for accuracy and completeness, and I agree with the above.

## 2020-11-21 NOTE — Patient Instructions (Addendum)
[place] at [facility] Discharge Instructions  You were seen today by Dr. Delton Coombes. He went over your recent results and scans. Continue taking calcium and vitamin D daily. Your CT scan will be repeated every 6 months; you will be scheduled to have a CT scan of your chest before your next visit. You will be referred back to Dr. Laural Golden to have a repeat colonoscopy 1 year after your colon surgery. Dr. Delton Coombes will see you back in 3 months for labs and follow up.   Thank you for choosing [place] at [facility] to provide your oncology and hematology care.  To afford each patient quality time with our provider, please arrive at least 15 minutes before your scheduled appointment time.   If you have a lab appointment with the [facility] please come in thru the Main Entrance and check in at the main information desk  You need to re-schedule your appointment should you arrive 10 or more minutes late.  We strive to give you quality time with our providers, and arriving late affects you and other patients whose appointments are after yours.  Also, if you no show three or more times for appointments you may be dismissed from the clinic at the providers discretion.     Again, thank you for choosing [facility].  Our hope is that these requests will decrease the amount of time that you wait before being seen by our physicians.       _____________________________________________________________  Should you have questions after your visit to [facility], please contact our office at [phone] between the hours of 8:00 a.m. and 4:30 p.m.  Voicemails left after 4:00 p.m. will not be returned until the following business day.  For prescription refill requests, have your pharmacy contact our office and allow 72 hours.    Cancer Center Support Programs:   > Cancer Support Group  2nd Tuesday of the month 1pm-2pm, Journey Room

## 2020-11-22 ENCOUNTER — Encounter (INDEPENDENT_AMBULATORY_CARE_PROVIDER_SITE_OTHER): Payer: Self-pay | Admitting: *Deleted

## 2020-11-28 ENCOUNTER — Ambulatory Visit (HOSPITAL_COMMUNITY): Payer: Medicare HMO | Admitting: Hematology

## 2020-12-06 ENCOUNTER — Other Ambulatory Visit (INDEPENDENT_AMBULATORY_CARE_PROVIDER_SITE_OTHER): Payer: Self-pay | Admitting: *Deleted

## 2020-12-06 ENCOUNTER — Telehealth (INDEPENDENT_AMBULATORY_CARE_PROVIDER_SITE_OTHER): Payer: Self-pay | Admitting: *Deleted

## 2020-12-06 ENCOUNTER — Encounter (INDEPENDENT_AMBULATORY_CARE_PROVIDER_SITE_OTHER): Payer: Self-pay | Admitting: *Deleted

## 2020-12-06 MED ORDER — PEG 3350-KCL-NA BICARB-NACL 420 G PO SOLR: 4000.0000 mL | Freq: Once | ORAL | 0 refills | Status: AC

## 2020-12-06 NOTE — Telephone Encounter (Signed)
Done

## 2020-12-06 NOTE — Telephone Encounter (Signed)
LeighAnn Concetta Guion, CMA  

## 2020-12-07 ENCOUNTER — Other Ambulatory Visit (INDEPENDENT_AMBULATORY_CARE_PROVIDER_SITE_OTHER): Payer: Self-pay

## 2020-12-07 DIAGNOSIS — Z86010 Personal history of colonic polyps: Secondary | ICD-10-CM

## 2020-12-07 DIAGNOSIS — C182 Malignant neoplasm of ascending colon: Secondary | ICD-10-CM

## 2020-12-14 ENCOUNTER — Telehealth (INDEPENDENT_AMBULATORY_CARE_PROVIDER_SITE_OTHER): Payer: Self-pay | Admitting: *Deleted

## 2020-12-14 NOTE — Telephone Encounter (Signed)
Referring MD/PCP: [facility]  Procedure: tcs w propoofl  Reason/Indication:  Hx colon ca  Has patient had this procedure before?  Yes, 02/20  If so, when, by whom and where?    Is there a family history of colon cancer?  no  Who?  What age when diagnosed?    Is patient diabetic? If yes, Type 1 or Type 2   no      Does patient have prosthetic heart valve or mechanical valve?  no  Do you have a pacemaker/defibrillator?  no  Has patient ever had endocarditis/atrial fibrillation? no  Have you had a stroke/heart attack last 6 mths? no  Does patient use oxygen? no  Has patient had joint replacement within last 12 months?  no  Is patient constipated or do they take laxatives? no  Does patient have a history of alcohol/drug use?  no  Is patient on blood thinner such as Coumadin, Plavix and/or Aspirin? no  Do you take medicine for weight loss?  no  For female patients,: do you still have your menstrual cycle? n/a  Medications: rosuvastatin 5 mg daily, iron 65 mg 1 every other day, mvi daily, calcium daily, fish oil 1000 mg daily, vit b12 daily  Allergies: nkda  Medication Adjustment per Dr Rehman/Dr Jenetta Downer iron 10 days  Procedure date & time: 01/09/21

## 2021-01-03 NOTE — Patient Instructions (Signed)
ZOHAL RENY  01/03/2021     @PREFPERIOPPHARMACY @   Your procedure is scheduled on  01/09/2021.   Report to [facility] at  [facility]  A.M.   Call this number if you have problems the morning of surgery:  [phone]   Remember:  Follow the diet and prep instructions given to you by the office.                     Take these medicines the morning of surgery with A SIP OF WATER  None.     Please brush your teeth.  Do not wear jewelry, make-up or nail polish.  Do not wear lotions, powders, or perfumes, or deodorant.  Do not shave 48 hours prior to surgery.  Men may shave face and neck.  Do not bring valuables to the hospital.  [facility] is not responsible for any belongings or valuables.  Contacts, dentures or bridgework may not be worn into surgery.  Leave your suitcase in the car.  After surgery it may be brought to your room.  For patients admitted to the hospital, discharge time will be determined by your treatment team.  Patients discharged the day of surgery will not be allowed to drive home and must have someone with them for 24 hours.     Special instructions:  DO NOT smoke tobacco or vape for 24 hours before your procedure.    Please read over the following fact sheets that you were given. Anesthesia Post-op Instructions and Care and Recovery After Surgery       Colonoscopy, Adult, Care After This sheet gives you information about how to care for yourself after your procedure. Your health care provider may also give you more specific instructions. If you have problems or questions, contact your health care provider. What can I expect after the procedure? After the procedure, it is common to have:  A small amount of blood in your stool for 24 hours after the procedure.  Some gas.  Mild cramping or bloating of your abdomen. Follow these instructions at home: Eating and drinking  Drink enough fluid to keep your urine pale yellow.  Follow  instructions from your health care provider about eating or drinking restrictions.  Resume your normal diet as instructed by your health care provider. Avoid heavy or fried foods that are hard to digest.   Activity  Rest as told by your health care provider.  Avoid sitting for a long time without moving. Get up to take short walks every 1-2 hours. This is important to improve blood flow and breathing. Ask for help if you feel weak or unsteady.  Return to your normal activities as told by your health care provider. Ask your health care provider what activities are safe for you. Managing cramping and bloating  Try walking around when you have cramps or feel bloated.  Apply heat to your abdomen as told by your health care provider. Use the heat source that your health care provider recommends, such as a moist heat pack or a heating pad. ? Place a towel between your skin and the heat source. ? Leave the heat on for 20-30 minutes. ? Remove the heat if your skin turns bright red. This is especially important if you are unable to feel pain, heat, or cold. You may have a greater risk of getting burned.   General instructions  If you were given a sedative during the procedure, it  can affect you for several hours. Do not drive or operate machinery until your health care provider says that it is safe.  For the first 24 hours after the procedure: ? Do not sign important documents. ? Do not drink alcohol. ? Do your regular daily activities at a slower pace than normal. ? Eat soft foods that are easy to digest.  Take over-the-counter and prescription medicines only as told by your health care provider.  Keep all follow-up visits as told by your health care provider. This is important. Contact a health care provider if:  You have blood in your stool 2-3 days after the procedure. Get help right away if you have:  More than a small spotting of blood in your stool.  Large blood clots in your  stool.  Swelling of your abdomen.  Nausea or vomiting.  A fever.  Increasing pain in your abdomen that is not relieved with medicine. Summary  After the procedure, it is common to have a small amount of blood in your stool. You may also have mild cramping and bloating of your abdomen.  If you were given a sedative during the procedure, it can affect you for several hours. Do not drive or operate machinery until your health care provider says that it is safe.  Get help right away if you have a lot of blood in your stool, nausea or vomiting, a fever, or increased pain in your abdomen. This information is not intended to replace advice given to you by your health care provider. Make sure you discuss any questions you have with your health care provider. Document Revised: 07/29/2019 Document Reviewed: 02/28/2019 Elsevier Patient Education  2021 [employer] After This sheet gives you information about how to care for yourself after your procedure. Your health care provider may also give you more specific instructions. If you have problems or questions, contact your health care provider. What can I expect after the procedure? After the procedure, it is common to have:  Tiredness.  Forgetfulness about what happened after the procedure.  Impaired judgment for important decisions.  Nausea or vomiting.  Some difficulty with balance. Follow these instructions at home: For the time period you were told by your health care provider:  Rest as needed.  Do not participate in activities where you could fall or become injured.  Do not drive or use machinery.  Do not drink alcohol.  Do not take sleeping pills or medicines that cause drowsiness.  Do not make important decisions or sign legal documents.  Do not take care of children on your own.      Eating and drinking  Follow the diet that is recommended by your health care provider.  Drink  enough fluid to keep your urine pale yellow.  If you vomit: ? Drink water, juice, or soup when you can drink without vomiting. ? Make sure you have little or no nausea before eating solid foods. General instructions  Have a responsible adult stay with you for the time you are told. It is important to have someone help care for you until you are awake and alert.  Take over-the-counter and prescription medicines only as told by your health care provider.  If you have sleep apnea, surgery and certain medicines can increase your risk for breathing problems. Follow instructions from your health care provider about wearing your sleep device: ? Anytime you are sleeping, including during daytime naps. ? While taking prescription pain medicines, sleeping medicines,  or medicines that make you drowsy.  Avoid smoking.  Keep all follow-up visits as told by your health care provider. This is important. Contact a health care provider if:  You keep feeling nauseous or you keep vomiting.  You feel light-headed.  You are still sleepy or having trouble with balance after 24 hours.  You develop a rash.  You have a fever.  You have redness or swelling around the IV site. Get help right away if:  You have trouble breathing.  You have new-onset confusion at home. Summary  For several hours after your procedure, you may feel tired. You may also be forgetful and have poor judgment.  Have a responsible adult stay with you for the time you are told. It is important to have someone help care for you until you are awake and alert.  Rest as told. Do not drive or operate machinery. Do not drink alcohol or take sleeping pills.  Get help right away if you have trouble breathing, or if you suddenly become confused. This information is not intended to replace advice given to you by your health care provider. Make sure you discuss any questions you have with your health care provider. Document Revised:  04/19/2020 Document Reviewed: 07/07/2019 Elsevier Patient Education  2021 [employer].

## 2021-01-07 ENCOUNTER — Other Ambulatory Visit (HOSPITAL_COMMUNITY)
Admission: RE | Admit: 2021-01-07 | Discharge: 2021-01-07 | Disposition: A | Discharge status: Home / Self Care | Payer: Medicare HMO | Source: Ambulatory Visit | Attending: Internal Medicine | Admitting: Internal Medicine

## 2021-01-07 ENCOUNTER — Other Ambulatory Visit: Payer: Self-pay

## 2021-01-07 ENCOUNTER — Encounter (HOSPITAL_COMMUNITY)
Admission: RE | Admit: 2021-01-07 | Discharge: 2021-01-07 | Disposition: A | Discharge status: Home / Self Care | Payer: Medicare HMO | Source: Ambulatory Visit | Attending: Internal Medicine | Admitting: Internal Medicine

## 2021-01-07 DIAGNOSIS — Z20822 Contact with and (suspected) exposure to covid-19: Secondary | ICD-10-CM | POA: Diagnosis not present

## 2021-01-07 DIAGNOSIS — Z01812 Encounter for preprocedural laboratory examination: Secondary | ICD-10-CM | POA: Insufficient documentation

## 2021-01-07 LAB — SARS CORONAVIRUS 2 (TAT 6-24 HRS): SARS Coronavirus 2: NEGATIVE

## 2021-01-09 ENCOUNTER — Ambulatory Visit (HOSPITAL_COMMUNITY)
Admission: RE | Admit: 2021-01-09 | Discharge: 2021-01-09 | Disposition: A | Discharge status: Home / Self Care | Payer: Medicare HMO | Source: Ambulatory Visit | Attending: Internal Medicine | Admitting: Internal Medicine

## 2021-01-09 ENCOUNTER — Ambulatory Visit (HOSPITAL_COMMUNITY): Payer: Medicare HMO | Admitting: Anesthesiology

## 2021-01-09 ENCOUNTER — Encounter (HOSPITAL_COMMUNITY)
Admission: RE | Disposition: A | Discharge status: Home / Self Care | Payer: Self-pay | Source: Ambulatory Visit | Attending: Internal Medicine

## 2021-01-09 ENCOUNTER — Encounter (HOSPITAL_COMMUNITY): Payer: Self-pay | Admitting: Internal Medicine

## 2021-01-09 ENCOUNTER — Encounter (INDEPENDENT_AMBULATORY_CARE_PROVIDER_SITE_OTHER): Payer: Self-pay | Admitting: *Deleted

## 2021-01-09 DIAGNOSIS — Z980 Intestinal bypass and anastomosis status: Secondary | ICD-10-CM | POA: Insufficient documentation

## 2021-01-09 DIAGNOSIS — Z9049 Acquired absence of other specified parts of digestive tract: Secondary | ICD-10-CM | POA: Insufficient documentation

## 2021-01-09 DIAGNOSIS — K5730 Diverticulosis of large intestine without perforation or abscess without bleeding: Secondary | ICD-10-CM | POA: Insufficient documentation

## 2021-01-09 DIAGNOSIS — K635 Polyp of colon: Secondary | ICD-10-CM | POA: Diagnosis not present

## 2021-01-09 DIAGNOSIS — R69 Illness, unspecified: Secondary | ICD-10-CM | POA: Diagnosis not present

## 2021-01-09 DIAGNOSIS — D123 Benign neoplasm of transverse colon: Secondary | ICD-10-CM | POA: Diagnosis not present

## 2021-01-09 DIAGNOSIS — F17210 Nicotine dependence, cigarettes, uncomplicated: Secondary | ICD-10-CM | POA: Insufficient documentation

## 2021-01-09 DIAGNOSIS — K644 Residual hemorrhoidal skin tags: Secondary | ICD-10-CM | POA: Insufficient documentation

## 2021-01-09 DIAGNOSIS — Z08 Encounter for follow-up examination after completed treatment for malignant neoplasm: Secondary | ICD-10-CM | POA: Insufficient documentation

## 2021-01-09 DIAGNOSIS — Z85038 Personal history of other malignant neoplasm of large intestine: Secondary | ICD-10-CM | POA: Diagnosis not present

## 2021-01-09 DIAGNOSIS — D649 Anemia, unspecified: Secondary | ICD-10-CM | POA: Diagnosis not present

## 2021-01-09 DIAGNOSIS — Z79899 Other long term (current) drug therapy: Secondary | ICD-10-CM | POA: Insufficient documentation

## 2021-01-09 DIAGNOSIS — Z86010 Personal history of colonic polyps: Secondary | ICD-10-CM

## 2021-01-09 DIAGNOSIS — Z808 Family history of malignant neoplasm of other organs or systems: Secondary | ICD-10-CM | POA: Insufficient documentation

## 2021-01-09 DIAGNOSIS — D125 Benign neoplasm of sigmoid colon: Secondary | ICD-10-CM | POA: Diagnosis not present

## 2021-01-09 DIAGNOSIS — C182 Malignant neoplasm of ascending colon: Secondary | ICD-10-CM

## 2021-01-09 HISTORY — PX: COLONOSCOPY WITH PROPOFOL: SHX5780

## 2021-01-09 HISTORY — PX: POLYPECTOMY: SHX5525

## 2021-01-09 LAB — HM COLONOSCOPY

## 2021-01-09 SURGERY — COLONOSCOPY WITH PROPOFOL
Anesthesia: General

## 2021-01-09 MED ORDER — LACTATED RINGERS IV SOLN: INTRAVENOUS | Status: DC

## 2021-01-09 MED ORDER — PROPOFOL 10 MG/ML IV BOLUS
INTRAVENOUS | Status: DC | PRN
  Administered 2021-01-09: 100 mg via INTRAVENOUS

## 2021-01-09 MED ORDER — PROPOFOL 500 MG/50ML IV EMUL
INTRAVENOUS | Status: DC | PRN
  Administered 2021-01-09: 125 ug/kg/min via INTRAVENOUS

## 2021-01-09 MED ORDER — PHENYLEPHRINE HCL (PRESSORS) 10 MG/ML IV SOLN
INTRAVENOUS | Status: DC | PRN
  Administered 2021-01-09: 120 ug via INTRAVENOUS
  Administered 2021-01-09: 40 ug via INTRAVENOUS
  Administered 2021-01-09 (×3): 80 ug via INTRAVENOUS

## 2021-01-09 MED ORDER — LIDOCAINE HCL (CARDIAC) PF 100 MG/5ML IV SOSY
PREFILLED_SYRINGE | INTRAVENOUS | Status: DC | PRN
  Administered 2021-01-09: 40 mg via INTRATRACHEAL

## 2021-01-09 NOTE — Anesthesia Postprocedure Evaluation (Signed)
Anesthesia Post Note  Patient: Sara Coffey  Procedure(s) Performed: COLONOSCOPY WITH PROPOFOL (N/A ) POLYPECTOMY  Patient location during evaluation: Phase II Anesthesia Type: General Level of consciousness: awake and alert and oriented Pain management: pain level controlled Vital Signs Assessment: post-procedure vital signs reviewed and stable Respiratory status: spontaneous breathing and respiratory function stable Cardiovascular status: blood pressure returned to baseline and stable Postop Assessment: no apparent nausea or vomiting Anesthetic complications: no   No complications documented.   Last Vitals:  Vitals:   01/09/21 0806 01/09/21 1037  BP: 120/69 (!) 93/52  Pulse: 70 75  Resp: 18 (!) 22  Temp: 36.9 C 36.8 C  SpO2: 97%     Last Pain:  Vitals:   01/09/21 1037  TempSrc: Oral  PainSc: 0-No pain                 Amelie Caracci C Emryn Flanery

## 2021-01-09 NOTE — H&P (Signed)
Sara Coffey is an 70 y.o. female.   Chief Complaint: Patient is here for colonoscopy. HPI: Patient is 70 year old Caucasian female who has a history of colonic polyps.  She underwent colonoscopy in July last year and was found to have polyps as well as adenocarcinoma of the ascending colon.  She underwent lap assisted right hemicolectomy.  She had T3 N0 MX disease.  She has remained in remission.  She has not required any adjuvant chemotherapy.  She is under care of Dr. Delton Coombes. She denies abdominal pain rectal bleeding diarrhea or weight loss. Family history is negative for colorectal carcinoma.  Past Medical History:  Diagnosis Date  . Amnesia 04/17/2019  . Hemorrhoid 09/28/2013  . Hyperlipidemia   . Osteoporosis   . Syncope and collapse   . TGA (transient global amnesia) 04/18/2019  . TIA (transient ischemic attack) 04/17/2019  . Tobacco abuse     Past Surgical History:  Procedure Laterality Date  . BIOPSY  03/02/2020   Procedure: BIOPSY;  Surgeon: Rogene Houston, MD;  Location: [facility];  Service: Endoscopy;;  ascending colon mass  . COLONOSCOPY WITH PROPOFOL N/A 03/02/2020   Procedure: COLONOSCOPY WITH PROPOFOL;  Surgeon: Rogene Houston, MD;  Location: [facility];  Service: Endoscopy;  Laterality: N/A;  1055  . KNEE SX    . LAPAROSCOPIC PARTIAL COLECTOMY N/A 04/09/2020   Procedure: LAPAROSCOPIC PARTIAL COLECTOMY;  Surgeon: Virl Cagey, MD;  Location: [facility];  Service: General;  Laterality: N/A;  . left eye surgery     . POLYPECTOMY  03/02/2020   Procedure: POLYPECTOMY;  Surgeon: Rogene Houston, MD;  Location: [facility];  Service: Endoscopy;;  . TUBAL LIGATION      Family History  Problem Relation Age of Onset  . Heart disease Father   . Hypertension Father   . Diabetes Father   . Cancer Brother   . Healthy Son   . Healthy Son   . Healthy Daughter   . Ulcers Mother   . Kidney failure Maternal Grandfather   . Throat cancer Paternal Grandfather    . Cancer Brother   . Breast cancer Neg Hx    Social History:  reports that she has been smoking cigarettes. She has a 40.00 pack-year smoking history. She has never used smokeless tobacco. She reports that she does not drink alcohol and does not use drugs.  Allergies: No Known Allergies  Medications Prior to Admission  Medication Sig Dispense Refill  . Calcium Carb-Cholecalciferol (CALCIUM-VITAMIN D) 600-400 MG-UNIT TABS Take 1 tablet by mouth every other day.    . calcium carbonate (TUMS - DOSED IN MG ELEMENTAL CALCIUM) 500 MG chewable tablet Chew 1 tablet by mouth as needed for indigestion or heartburn.    . Ferrous Sulfate (IRON) 325 (65 Fe) MG TABS Take 325 mg by mouth every other day. 1 every other day    . Melatonin 5 MG TABS Take 5 mg by mouth at bedtime as needed (sleep).     . Multiple Vitamins-Minerals (MULTIVITAMIN WITH MINERALS) tablet Take 1 tablet by mouth daily.    . Omega-3 Fatty Acids (FISH OIL) 1000 MG CAPS Take 2,000 mg by mouth in the morning and at bedtime.    . rosuvastatin (CRESTOR) 5 MG tablet Take 1 tablet (5 mg total) by mouth daily. 90 tablet 1    No results found for this or any previous visit (from the past 48 hour(s)). No results found.  Review of Systems  Blood  pressure 120/69, pulse 70, temperature 98.4 F (36.9 C), temperature source Oral, resp. rate 18, SpO2 97 %. Physical Exam HENT:     Mouth/Throat:     Mouth: Mucous membranes are moist.     Pharynx: Oropharynx is clear.  Eyes:     General: No scleral icterus.    Conjunctiva/sclera: Conjunctivae normal.  Cardiovascular:     Rate and Rhythm: Normal rate and regular rhythm.     Heart sounds: Normal heart sounds. No murmur heard.   Pulmonary:     Effort: Pulmonary effort is normal.     Breath sounds: Normal breath sounds.  Abdominal:     Comments: Abdomen is symmetrical.  Small midline incision noted above lumbar Lykens.  Abdomen is soft and nontender with organomegaly or masses.   Musculoskeletal:        General: No swelling.     Cervical back: Neck supple.  Lymphadenopathy:     Cervical: No cervical adenopathy.  Skin:    General: Skin is warm and dry.  Neurological:     Mental Status: She is alert.      Assessment/Plan  History of colon carcinoma. Surveillance colonoscopy.  Hildred Laser, MD 01/09/2021, 9:58 AM

## 2021-01-09 NOTE — Anesthesia Preprocedure Evaluation (Signed)
Anesthesia Evaluation  Patient identified by MRN, date of birth, ID band Patient awake    Reviewed: Allergy & Precautions, NPO status , Patient's Chart, lab work & pertinent test results  History of Anesthesia Complications Negative for: history of anesthetic complications  Airway Mallampati: I  TM Distance: >3 FB Neck ROM: Full    Dental  (+) Edentulous Upper, Edentulous Lower   Pulmonary Current SmokerPatient did not abstain from smoking.,    Pulmonary exam normal breath sounds clear to auscultation       Cardiovascular Exercise Tolerance: Good Normal cardiovascular exam+ Valvular Problems/Murmurs  Rhythm:Regular Rate:Normal     Neuro/Psych TIAnegative psych ROS   GI/Hepatic negative GI ROS, Neg liver ROS,   Endo/Other  negative endocrine ROS  Renal/GU negative Renal ROS     Musculoskeletal negative musculoskeletal ROS (+)   Abdominal   Peds  Hematology  (+) anemia ,   Anesthesia Other Findings   Reproductive/Obstetrics negative OB ROS                            Anesthesia Physical Anesthesia Plan  ASA: II  Anesthesia Plan: General   Post-op Pain Management:    Induction: Intravenous  PONV Risk Score and Plan: Propofol infusion  Airway Management Planned: Nasal Cannula and Natural Airway  Additional Equipment:   Intra-op Plan:   Post-operative Plan:   Informed Consent: I have reviewed the patients History and Physical, chart, labs and discussed the procedure including the risks, benefits and alternatives for the proposed anesthesia with the patient or authorized representative who has indicated his/her understanding and acceptance.     Dental advisory given  Plan Discussed with: CRNA and Surgeon  Anesthesia Plan Comments:        Anesthesia Quick Evaluation

## 2021-01-09 NOTE — Op Note (Signed)
[facility] Patient Name: Sara Coffey Procedure Date: 01/09/2021 9:44 AM MRN: [id number] Date of Birth: [birth year] Attending MD: Hildred Laser , MD CSN: [id number] Age: 70 Admit Type: Outpatient Procedure:                Colonoscopy Indications:              High risk colon cancer surveillance: Personal                            history of colon cancer Providers:                Hildred Laser, MD, Janeece Riggers, RN, Kristine L.                            Risa Grill, Technician Referring MD:             Zella Richer. Bartlett, MD, Medicines:                Propofol per Anesthesia Complications:            No immediate complications. Estimated Blood Loss:     Estimated blood loss was minimal. Procedure:                Pre-Anesthesia Assessment:                           - Prior to the procedure, a History and Physical                            was performed, and patient medications and                            allergies were reviewed. The patient's tolerance of                            previous anesthesia was also reviewed. The risks                            and benefits of the procedure and the sedation                            options and risks were discussed with the patient.                            All questions were answered, and informed consent                            was obtained. Prior Anticoagulants: The patient has                            taken no previous anticoagulant or antiplatelet                            agents. ASA Grade Assessment: II - A patient with  mild systemic disease. After reviewing the risks                            and benefits, the patient was deemed in                            satisfactory condition to undergo the procedure.                           After obtaining informed consent, the colonoscope                            was passed under direct vision. Throughout the                             procedure, the patient's blood pressure, pulse, and                            oxygen saturations were monitored continuously. The                            [id number]-[id number] ([id number]) scope was introduced through                            the anus and advanced to the the ileocolonic                            anastomosis. The colonoscopy was performed without                            difficulty. The patient tolerated the procedure                            well. The quality of the bowel preparation was                            adequate. The terminal ileum and the rectum were                            photographed. Scope In: 10:05:24 AM Scope Out: 10:30:26 AM Scope Withdrawal Time: 0 hours 18 minutes 54 seconds  Total Procedure Duration: 0 hours 25 minutes 2 seconds  Findings:      The perianal and digital rectal examinations were normal.      There was evidence of a prior end-to-end ileo-colonic anastomosis in the       proximal transverse colon. This was patent and was characterized by       healthy appearing mucosa and visible sutures. The anastomosis was       traversed.      A 6 mm polyp was found in the proximal transverse colon. The polyp was       sessile. The polyp was removed with a cold snare. Resection and       retrieval were complete. The pathology specimen was placed into Bottle       Number 1.  Two polyps were found in the proximal transverse colon. The polyps were       small in size. These were biopsied with a cold forceps for histology.       The pathology specimen was placed into Bottle Number 2.      A 6 mm polyp was found in the mid sigmoid colon. The polyp was       carpet-like. The polyp was removed with a cold snare. Resection and       retrieval were complete. The pathology specimen was placed into Bottle       Number 3.      Multiple diverticula were found in the sigmoid colon.      External hemorrhoids were found during retroflexion. The hemorrhoids        were small. Impression:               - Patent end-to-end ileo-colonic anastomosis,                            characterized by healthy appearing mucosa and                            visible sutures.                           - One 6 mm polyp in the proximal transverse colon,                            removed with a cold snare. Resected and retrieved.                           - Two small polyps in the proximal transverse                            colon. Biopsied.                           - One 6 mm polyp in the mid sigmoid colon, removed                            with a cold snare. Resected and retrieved.                           - Diverticulosis in the sigmoid colon.                           - External hemorrhoids. Moderate Sedation:      Per Anesthesia Care Recommendation:           - Patient has a contact number available for                            emergencies. The signs and symptoms of potential                            delayed complications were discussed with the  patient. Return to normal activities tomorrow.                            Written discharge instructions were provided to the                            patient.                           - High fiber diet today.                           - Continue present medications.                           - No aspirin, ibuprofen, naproxen, or other                            non-steroidal anti-inflammatory drugs for 1 day.                           - Await pathology results.                           - Repeat colonoscopy in 3 years for surveillance. Procedure Code(s):        --- Professional ---                           [phone], Colonoscopy, flexible; with removal of                            tumor(s), polyp(s), or other lesion(s) by snare                            technique                           45380, 106, Colonoscopy, flexible; with biopsy,                            single or  multiple Diagnosis Code(s):        --- Professional ---                           K63.5, Polyp of colon                           Z85.038, Personal history of other malignant                            neoplasm of large intestine                           Z98.0, Intestinal bypass and anastomosis status                           K64.4, Residual hemorrhoidal skin tags  K57.30, Diverticulosis of large intestine without                            perforation or abscess without bleeding CPT copyright 2019 [employer]. All rights reserved. The codes documented in this report are preliminary and upon coder review may  be revised to meet current compliance requirements. Hildred Laser, MD Hildred Laser, MD 01/09/2021 10:42:25 AM This report has been signed electronically. Number of Addenda: 0

## 2021-01-09 NOTE — Transfer of Care (Signed)
Immediate Anesthesia Transfer of Care Note  Patient: Sara Coffey  Procedure(s) Performed: COLONOSCOPY WITH PROPOFOL (N/A ) POLYPECTOMY  Patient Location: PACU and Short Stay  Anesthesia Type:General  Level of Consciousness: awake  Airway & Oxygen Therapy: Patient Spontanous Breathing  Post-op Assessment: Report given to RN and Post -op Vital signs reviewed and stable  Post vital signs: Reviewed and stable  Last Vitals:  Vitals Value Taken Time  BP    Temp    Pulse    Resp    SpO2      Last Pain:  Vitals:   01/09/21 0806  TempSrc: Oral  PainSc: 3       Patients Stated Pain Goal: 6 (10/40/45 9136)  Complications: No complications documented.

## 2021-01-09 NOTE — Discharge Instructions (Signed)
No aspirin or NSAIDs for 24 hours. Resume usual medications as before. High-fiber diet. No driving for 24 hours. Physician will call with biopsy results.   Monitored Anesthesia Care, Care After This sheet gives you information about how to care for yourself after your procedure. Your health care provider may also give you more specific instructions. If you have problems or questions, contact your health care provider. What can I expect after the procedure? After the procedure, it is common to have:  Tiredness.  Forgetfulness about what happened after the procedure.  Impaired judgment for important decisions.  Nausea or vomiting.  Some difficulty with balance. Follow these instructions at home: For the time period you were told by your health care provider:  Rest as needed.  Do not participate in activities where you could fall or become injured.  Do not drive or use machinery.  Do not drink alcohol.  Do not take sleeping pills or medicines that cause drowsiness.  Do not make important decisions or sign legal documents.  Do not take care of children on your own.      Eating and drinking  Follow the diet that is recommended by your health care provider.  Drink enough fluid to keep your urine pale yellow.  If you vomit: ? Drink water, juice, or soup when you can drink without vomiting. ? Make sure you have little or no nausea before eating solid foods. General instructions  Have a responsible adult stay with you for the time you are told. It is important to have someone help care for you until you are awake and alert.  Take over-the-counter and prescription medicines only as told by your health care provider.  If you have sleep apnea, surgery and certain medicines can increase your risk for breathing problems. Follow instructions from your health care provider about wearing your sleep device: ? Anytime you are sleeping, including during daytime naps. ? While  taking prescription pain medicines, sleeping medicines, or medicines that make you drowsy.  Avoid smoking.  Keep all follow-up visits as told by your health care provider. This is important. Contact a health care provider if:  You keep feeling nauseous or you keep vomiting.  You feel light-headed.  You are still sleepy or having trouble with balance after 24 hours.  You develop a rash.  You have a fever.  You have redness or swelling around the IV site. Get help right away if:  You have trouble breathing.  You have new-onset confusion at home. Summary  For several hours after your procedure, you may feel tired. You may also be forgetful and have poor judgment.  Have a responsible adult stay with you for the time you are told. It is important to have someone help care for you until you are awake and alert.  Rest as told. Do not drive or operate machinery. Do not drink alcohol or take sleeping pills.  Get help right away if you have trouble breathing, or if you suddenly become confused. This information is not intended to replace advice given to you by your health care provider. Make sure you discuss any questions you have with your health care provider. Document Revised: 04/19/2020 Document Reviewed: 07/07/2019 Elsevier Patient Education  2021 [employer].   Colon Polyps  Colon polyps are tissue growths inside the colon, which is part of the large intestine. They are one of the types of polyps that can grow in the body. A polyp may be a round bump or a  mushroom-shaped growth. You could have one polyp or more than one. Most colon polyps are noncancerous (benign). However, some colon polyps can become cancerous over time. Finding and removing the polyps early can help prevent this. What are the causes? The exact cause of colon polyps is not known. What increases the risk? The following factors may make you more likely to develop this condition:  Having a family history of  colorectal cancer or colon polyps.  Being older than 68 years of age.  Being younger than 68 years of age and having a significant family history of colorectal cancer or colon polyps or a genetic condition that puts you at higher risk of getting colon polyps.  Having inflammatory bowel disease, such as ulcerative colitis or Crohn's disease.  Having certain conditions passed from parent to child (hereditary conditions), such as: ? Familial adenomatous polyposis (FAP). ? Lynch syndrome. ? Turcot syndrome. ? Peutz-Jeghers syndrome. ? MUTYH-associated polyposis (MAP).  Being overweight.  Certain lifestyle factors. These include smoking cigarettes, drinking too much alcohol, not getting enough exercise, and eating a diet that is high in fat and red meat and low in fiber.  Having had childhood cancer that was treated with radiation of the abdomen. What are the signs or symptoms? Many times, there are no symptoms. If you have symptoms, they may include:  Blood coming from the rectum during a bowel movement.  Blood in the stool (feces). The blood may be bright red or very dark in color.  Pain in the abdomen.  A change in bowel habits, such as constipation or diarrhea. How is this diagnosed? This condition is diagnosed with a colonoscopy. This is a procedure in which a lighted, flexible scope is inserted into the opening between the buttocks (anus) and then passed into the colon to examine the area. Polyps are sometimes found when a colonoscopy is done as part of routine cancer screening tests. How is this treated? This condition is treated by removing any polyps that are found. Most polyps can be removed during a colonoscopy. Those polyps will then be tested for cancer. Additional treatment may be needed depending on the results of testing. Follow these instructions at home: Eating and drinking  Eat foods that are high in fiber, such as fruits, vegetables, and whole grains.  Eat foods  that are high in calcium and vitamin D, such as milk, cheese, yogurt, eggs, liver, fish, and broccoli.  Limit foods that are high in fat, such as fried foods and desserts.  Limit the amount of red meat, precooked or cured meat, or other processed meat that you eat, such as hot dogs, sausages, bacon, or meat loaves.  Limit sugary drinks.   Lifestyle  Maintain a healthy weight, or lose weight if recommended by your health care provider.  Exercise every day or as told by your health care provider.  Do not use any products that contain nicotine or tobacco, such as cigarettes, e-cigarettes, and chewing tobacco. If you need help quitting, ask your health care provider.  Do not drink alcohol if:  Colonoscopy, Adult, Care After This sheet gives you information about how to care for yourself after your procedure. Your doctor may also give you more specific instructions. If you have problems or questions, call your doctor. What can I expect after the procedure? After the procedure, it is common to have:  A small amount of blood in your poop (stool) for 24 hours.  Some gas.  Mild cramping or bloating in your belly (  abdomen). Follow these instructions at home: Eating and drinking  Drink enough fluid to keep your pee (urine) pale yellow.  Follow instructions from your doctor about what you cannot eat or drink.  Return to your normal diet as told by your doctor. Avoid heavy or fried foods that are hard to digest.   Activity  Rest as told by your doctor.  Do not sit for a long time without moving. Get up to take short walks every 1-2 hours. This is important. Ask for help if you feel weak or unsteady.  Return to your normal activities as told by your doctor. Ask your doctor what activities are safe for you. To help cramping and bloating:  Try walking around.  Put heat on your belly as told by your doctor. Use the heat source that your doctor recommends, such as a moist heat pack or a  heating pad. ? Put a towel between your skin and the heat source. ? Leave the heat on for 20-30 minutes. ? Remove the heat if your skin turns bright red. This is very important if you are unable to feel pain, heat, or cold. You may have a greater risk of getting burned.   General instructions  If you were given a medicine to help you relax (sedative) during your procedure, it can affect you for many hours. Do not drive or use machinery until your doctor says that it is safe.  For the first 24 hours after the procedure: ? Do not sign important documents. ? Do not drink alcohol. ? Do your daily activities more slowly than normal. ? Eat foods that are soft and easy to digest.  Take over-the-counter or prescription medicines only as told by your doctor.  Keep all follow-up visits as told by your doctor. This is important. Contact a doctor if:  You have blood in your poop 2-3 days after the procedure. Get help right away if:  You have more than a small amount of blood in your poop.  You see large clumps of tissue (blood clots) in your poop.  Your belly is swollen.  You feel like you may vomit (nauseous).  You vomit.  You have a fever.  You have belly pain that gets worse, and medicine does not help your pain. Summary  After the procedure, it is common to have a small amount of blood in your poop. You may also have mild cramping and bloating in your belly.  If you were given a medicine to help you relax (sedative) during your procedure, it can affect you for many hours. Do not drive or use machinery until your doctor says that it is safe.  Get help right away if you have a lot of blood in your poop, feel like you may vomit, have a fever, or have more belly pain. This information is not intended to replace advice given to you by your health care provider. Make sure you discuss any questions you have with your health care provider. Document Revised: 06/10/2019 Document Reviewed:  02/28/2019 Elsevier Patient Education  [place]. ? Your health care provider tells you not to drink. ? You are pregnant, may be pregnant, or are planning to become pregnant.  If you drink alcohol: ? Limit how much you use to:  0-1 drink a day for women.  0-2 drinks a day for men. ? Know how much alcohol is in your drink. In the [place] one drink equals one 12 oz bottle of beer (355 mL),  one 5 oz glass of wine (148 mL), or one 1 oz glass of hard liquor (44 mL). General instructions  Take over-the-counter and prescription medicines only as told by your health care provider.  Keep all follow-up visits. This is important. This includes having regularly scheduled colonoscopies. Talk to your health care provider about when you need a colonoscopy. Contact a health care provider if:  You have new or worsening bleeding during a bowel movement.  You have new or increased blood in your stool.  You have a change in bowel habits.  You lose weight for no known reason. Summary  Colon polyps are tissue growths inside the colon, which is part of the large intestine. They are one type of polyp that can grow in the body.  Most colon polyps are noncancerous (benign), but some can become cancerous over time.  This condition is diagnosed with a colonoscopy.  This condition is treated by removing any polyps that are found. Most polyps can be removed during a colonoscopy. This information is not intended to replace advice given to you by your health care provider. Make sure you discuss any questions you have with your health care provider. Document Revised: 11/23/2019 Document Reviewed: 11/23/2019 Elsevier Patient Education  2021 [employer].

## 2021-01-10 LAB — SURGICAL PATHOLOGY

## 2021-01-16 ENCOUNTER — Encounter (INDEPENDENT_AMBULATORY_CARE_PROVIDER_SITE_OTHER): Payer: Self-pay | Admitting: *Deleted

## 2021-01-17 ENCOUNTER — Encounter (HOSPITAL_COMMUNITY): Payer: Self-pay | Admitting: Internal Medicine

## 2021-02-27 ENCOUNTER — Inpatient Hospital Stay (HOSPITAL_COMMUNITY): Payer: Medicare HMO | Attending: Hematology

## 2021-02-27 ENCOUNTER — Ambulatory Visit (HOSPITAL_COMMUNITY)
Admission: RE | Admit: 2021-02-27 | Discharge: 2021-02-27 | Disposition: A | Discharge status: Home / Self Care | Payer: Medicare HMO | Source: Ambulatory Visit | Attending: Hematology | Admitting: Hematology

## 2021-02-27 ENCOUNTER — Other Ambulatory Visit: Payer: Self-pay

## 2021-02-27 DIAGNOSIS — I700 Atherosclerosis of aorta: Secondary | ICD-10-CM | POA: Diagnosis not present

## 2021-02-27 DIAGNOSIS — R911 Solitary pulmonary nodule: Secondary | ICD-10-CM | POA: Diagnosis not present

## 2021-02-27 DIAGNOSIS — F17210 Nicotine dependence, cigarettes, uncomplicated: Secondary | ICD-10-CM | POA: Diagnosis not present

## 2021-02-27 DIAGNOSIS — C182 Malignant neoplasm of ascending colon: Secondary | ICD-10-CM | POA: Diagnosis not present

## 2021-02-27 DIAGNOSIS — R918 Other nonspecific abnormal finding of lung field: Secondary | ICD-10-CM | POA: Diagnosis not present

## 2021-02-27 DIAGNOSIS — R69 Illness, unspecified: Secondary | ICD-10-CM | POA: Diagnosis not present

## 2021-02-27 DIAGNOSIS — C189 Malignant neoplasm of colon, unspecified: Secondary | ICD-10-CM | POA: Diagnosis not present

## 2021-02-27 LAB — COMPREHENSIVE METABOLIC PANEL
ALT: 18 U/L (ref 0–44)
AST: 20 U/L (ref 15–41)
Albumin: 4.1 g/dL (ref 3.5–5.0)
Alkaline Phosphatase: 65 U/L (ref 38–126)
Anion gap: 8 (ref 5–15)
BUN: 12 mg/dL (ref 8–23)
CO2: 27 mmol/L (ref 22–32)
Calcium: 9.6 mg/dL (ref 8.9–10.3)
Chloride: 103 mmol/L (ref 98–111)
Creatinine, Ser: 0.8 mg/dL (ref 0.44–1.00)
GFR, Estimated: >60 mL/min (ref >60)
Glucose, Bld: 86 mg/dL (ref 70–99)
Potassium: 4 mmol/L (ref 3.5–5.1)
Sodium: 138 mmol/L (ref 135–145)
Total Bilirubin: 0.9 mg/dL (ref 0.3–1.2)
Total Protein: 6.7 g/dL (ref 6.5–8.1)

## 2021-02-27 LAB — CBC WITH DIFFERENTIAL/PLATELET
Abs Immature Granulocytes: 0.01 K/uL (ref 0.00–0.07)
Basophils Absolute: 0.1 K/uL (ref 0.0–0.1)
Basophils Relative: 2 %
Eosinophils Absolute: 0.4 K/uL (ref 0.0–0.5)
Eosinophils Relative: 7 %
HCT: 39.5 % (ref 36.0–46.0)
Hemoglobin: 13.5 g/dL (ref 12.0–15.0)
Immature Granulocytes: 0 %
Lymphocytes Relative: 37 %
Lymphs Abs: 2 K/uL (ref 0.7–4.0)
MCH: 32.4 pg (ref 26.0–34.0)
MCHC: 34.2 g/dL (ref 30.0–36.0)
MCV: 94.7 fL (ref 80.0–100.0)
Monocytes Absolute: 0.4 K/uL (ref 0.1–1.0)
Monocytes Relative: 7 %
Neutro Abs: 2.6 K/uL (ref 1.7–7.7)
Neutrophils Relative %: 47 %
Platelets: 229 K/uL (ref 150–400)
RBC: 4.17 MIL/uL (ref 3.87–5.11)
RDW: 12.7 % (ref 11.5–15.5)
WBC: 5.5 K/uL (ref 4.0–10.5)
nRBC: 0 % (ref 0.0–0.2)

## 2021-02-28 LAB — CEA: CEA: 3.2 ng/mL (ref 0.0–4.7)

## 2021-03-06 ENCOUNTER — Encounter (HOSPITAL_COMMUNITY): Payer: Self-pay | Admitting: Hematology and Oncology

## 2021-03-06 ENCOUNTER — Inpatient Hospital Stay (HOSPITAL_COMMUNITY): Payer: Medicare HMO | Admitting: Hematology and Oncology

## 2021-03-06 ENCOUNTER — Other Ambulatory Visit: Payer: Self-pay

## 2021-03-06 VITALS — BP 114/70 | HR 71 | Temp 98.5°F | Resp 16 | Wt 137.6 lb

## 2021-03-06 DIAGNOSIS — C3412 Malignant neoplasm of upper lobe, left bronchus or lung: Secondary | ICD-10-CM

## 2021-03-06 DIAGNOSIS — R918 Other nonspecific abnormal finding of lung field: Secondary | ICD-10-CM | POA: Diagnosis not present

## 2021-03-06 DIAGNOSIS — C182 Malignant neoplasm of ascending colon: Secondary | ICD-10-CM

## 2021-03-06 DIAGNOSIS — C3490 Malignant neoplasm of unspecified part of unspecified bronchus or lung: Secondary | ICD-10-CM

## 2021-03-06 DIAGNOSIS — R69 Illness, unspecified: Secondary | ICD-10-CM | POA: Diagnosis not present

## 2021-03-06 NOTE — Progress Notes (Signed)
[facility] progress notes  Patient Care Team: Default, Provider, MD as PCP - General Brien Mates, RN as Oncology Nurse Navigator (Oncology)  CHIEF COMPLAINTS/PURPOSE OF VISIT:  New lung nodule, concern for lung cancer primary History of colon cancer  HISTORY OF PRESENTING ILLNESS:  Sara Coffey 70 y.o. female is seen because her primary oncologist is not available The patient has significant smoking history and is known to have lung nodule On her most recent CT scan of the chest to follow-up on the lung nodule, it was noted that the lung nodule is more dense, worrisome for cancer diagnosis The patient was a heavy smoker but she quit smoking yesterday She denies cough, chest pain or shortness of breath She has no new GI symptoms from her colon cancer standpoint I reviewed her most recent colonoscopy report  I reviewed the patient's records extensive and collaborated the history with the patient. Summary of her history is as follows: Oncology History  Cancer of ascending colon ([place])  05/10/2020 Genetic Testing   BRAF Mutation Analysis:     05/11/2020 Genetic Testing   MLH1 Promoter Methylation Analysis:       MEDICAL HISTORY:  Past Medical History:  Diagnosis Date   Amnesia 04/17/2019   Hemorrhoid 09/28/2013   Hyperlipidemia    Osteoporosis    Syncope and collapse    TGA (transient global amnesia) 04/18/2019   TIA (transient ischemic attack) 04/17/2019   Tobacco abuse     SURGICAL HISTORY: Past Surgical History:  Procedure Laterality Date   BIOPSY  03/02/2020   Procedure: BIOPSY;  Surgeon: Rogene Houston, MD;  [facility];  Service: Endoscopy;;  ascending colon mass   COLONOSCOPY WITH PROPOFOL N/A 03/02/2020   Procedure: COLONOSCOPY WITH PROPOFOL;  Surgeon: Rogene Houston, MD;  [facility];  Service: Endoscopy;  Laterality: N/A;  1055   COLONOSCOPY WITH PROPOFOL N/A 01/09/2021   Procedure: COLONOSCOPY WITH  PROPOFOL;  Surgeon: Rogene Houston, MD;  [facility];  Service: Endoscopy;  Laterality: N/A;  945   KNEE SX     LAPAROSCOPIC PARTIAL COLECTOMY N/A 04/09/2020   Procedure: LAPAROSCOPIC PARTIAL COLECTOMY;  Surgeon: Virl Cagey, MD;  Location: [facility];  Service: General;  Laterality: N/A;   left eye surgery      POLYPECTOMY  03/02/2020   Procedure: POLYPECTOMY;  Surgeon: Rogene Houston, MD;  [facility];  Service: Endoscopy;;   POLYPECTOMY  01/09/2021   Procedure: POLYPECTOMY;  Surgeon: Rogene Houston, MD;  [facility];  Service: Endoscopy;;   TUBAL LIGATION      SOCIAL HISTORY: Social History   Socioeconomic History   Marital status: Married    Spouse name: Not on file   Number of children: 3   Years of education: Not on file   Highest education level: Not on file  Occupational History   Occupation: retired  Tobacco Use   Smoking status: Every Day    Packs/day: 1.00    Years: 40.00    Pack years: 40.00    Types: Cigarettes   Smokeless tobacco: Never  Vaping Use   Vaping Use: Never used  Substance and Sexual Activity   Alcohol use: No   Drug use: No   Sexual activity: Not Currently    Birth control/protection: Surgical, Post-menopausal    Comment: tubal  Other Topics Concern   Not on file  Social History Narrative   Not on file  Social Determinants of Health   Financial Resource Strain: Low Risk    Difficulty of Paying Living Expenses: Not hard at all  Food Insecurity: No Food Insecurity   Worried About Charity fundraiser in the Last Year: Never true   [place] in the Last Year: Never true  Transportation Needs: No Transportation Needs   Lack of Transportation (Medical): No   Lack of Transportation (Non-Medical): No  Physical Activity: Inactive   Days of Exercise per Week: 0 days   Minutes of Exercise per Session: 0 min  Stress: No Stress Concern Present   Feeling of Stress : Only a little  Social  Connections: Moderately Isolated   Frequency of Communication with Friends and Family: More than three times a week   Frequency of Social Gatherings with Friends and Family: Once a week   Attends Religious Services: Never   Marine scientist or Organizations: No   Attends Music therapist: Never   Marital Status: Married  Human resources officer Violence: Not At Risk   Fear of Current or Ex-Partner: No   Emotionally Abused: No   Physically Abused: No   Sexually Abused: No    FAMILY HISTORY: Family History  Problem Relation Age of Onset   Heart disease Father    Hypertension Father    Diabetes Father    Cancer Brother    Healthy Son    Healthy Son    Healthy Daughter    Ulcers Mother    Kidney failure Maternal Grandfather    Throat cancer Paternal Grandfather    Cancer Brother    Breast cancer Neg Hx     ALLERGIES:  has No Known Allergies.  MEDICATIONS:  Current Outpatient Medications  Medication Sig Dispense Refill   Calcium Carb-Cholecalciferol (CALCIUM-VITAMIN D) 600-400 MG-UNIT TABS Take 1 tablet by mouth every other day.     calcium carbonate (TUMS - DOSED IN MG ELEMENTAL CALCIUM) 500 MG chewable tablet Chew 1 tablet by mouth as needed for indigestion or heartburn.     Ferrous Sulfate (IRON) 325 (65 Fe) MG TABS Take 325 mg by mouth every other day. 1 every other day     Multiple Vitamins-Minerals (MULTIVITAMIN WITH MINERALS) tablet Take 1 tablet by mouth daily.     Omega-3 Fatty Acids (FISH OIL) 1000 MG CAPS Take 2,000 mg by mouth in the morning and at bedtime.     rosuvastatin (CRESTOR) 5 MG tablet Take 1 tablet (5 mg total) by mouth daily. 90 tablet 1   Melatonin 5 MG TABS Take 5 mg by mouth at bedtime as needed (sleep).  (Patient not taking: Reported on 03/06/2021)     No current facility-administered medications for this visit.    REVIEW OF SYSTEMS:   Constitutional: Denies fevers, chills or abnormal night sweats Eyes: Denies blurriness of vision,  double vision or watery eyes Ears, nose, mouth, throat, and face: Denies mucositis or sore throat Respiratory: Denies cough, dyspnea or wheezes Cardiovascular: Denies palpitation, chest discomfort or lower extremity swelling Gastrointestinal:  Denies nausea, heartburn or change in bowel habits Skin: Denies abnormal skin rashes Lymphatics: Denies new lymphadenopathy or easy bruising Neurological:Denies numbness, tingling or new weaknesses Behavioral/Psych: Mood is stable, no new changes  All other systems were reviewed with the patient and are negative.  PHYSICAL EXAMINATION: ECOG PERFORMANCE STATUS: 0 - Asymptomatic  Vitals:   03/06/21 1120  BP: 114/70  Pulse: 71  Resp: 16  Temp: 98.5 F (36.9 C)  SpO2: 98%  Filed Weights   03/06/21 1120  Weight: 137 lb 9.6 oz (62.4 kg)    GENERAL:alert, no distress and comfortable SKIN: skin color, texture, turgor are normal, no rashes or significant lesions EYES: normal, conjunctiva are pink and non-injected, sclera clear OROPHARYNX:no exudate, normal lips, buccal mucosa, and tongue  NECK: supple, thyroid normal size, non-tender, without nodularity LYMPH:  no palpable lymphadenopathy in the cervical, axillary or inguinal LUNGS: clear to auscultation and percussion with normal breathing effort HEART: regular rate & rhythm and no murmurs without lower extremity edema ABDOMEN:abdomen soft, non-tender and normal bowel sounds Musculoskeletal:no cyanosis of digits and no clubbing  PSYCH: alert & oriented x 3 with fluent speech NEURO: no focal motor/sensory deficits  LABORATORY DATA:  I have reviewed the data as listed Lab Results  Component Value Date   WBC 5.5 02/27/2021   HGB 13.5 02/27/2021   HCT 39.5 02/27/2021   MCV 94.7 02/27/2021   PLT 229 02/27/2021   Recent Labs    04/10/20 0732 04/11/20 0628 04/12/20 0712 05/15/20 1143 08/16/20 1036 11/14/20 0918 02/27/21 1106  NA 137 136 137  --  141 142 138  K 3.4* 4.1 3.9  --   4.9 4.3 4.0  CL 100 108 107  --  103 104 103  CO2 '29 23 24  ' --  '28 28 27  ' GLUCOSE 105* 113* 88  --  124* 96 86  BUN '9 10 9  ' --  '11 14 12  ' CREATININE 0.82 0.67 0.57   < > 0.83 0.74 0.80  CALCIUM 8.9 7.9* 8.2*  --  9.7 10.4* 9.6  GFRNONAA >60 >60 >60  --  >60 >60 >60  GFRAA >60 >60 >60  --   --   --   --   PROT  --   --   --   --  7.2 7.9 6.7  ALBUMIN  --   --   --   --  4.3 4.6 4.1  AST  --   --   --   --  '20 22 20  ' ALT  --   --   --   --  '15 21 18  ' ALKPHOS  --   --   --   --  58 66 65  BILITOT  --   --   --   --  0.4 0.5 0.9   < > = values in this interval not displayed.    RADIOGRAPHIC STUDIES: I have reviewed imaging study with the patient and her daughter I have personally reviewed the radiological images as listed and agreed with the findings in the report. CT Chest Wo Contrast  Result Date: 02/28/2021 CLINICAL DATA:  Lung nodule surveillance EXAM: CT CHEST WITHOUT CONTRAST TECHNIQUE: Multidetector CT imaging of the chest was performed following the standard protocol without IV contrast. COMPARISON:  05/15/2020 FINDINGS: Cardiovascular: Mild atherosclerotic calcification of the thoracic aorta and left anterior descending coronary artery. Mediastinum/Nodes: Lower right paratracheal lymph node anterior to the carina measures 0.9 cm in short axis on image 57 series 2, previously the same. Lungs/Pleura: Mild biapical pleuroparenchymal scarring. 2 by 3 mm right lower lobe pulmonary nodule on image 76 series 4, no change from 05/15/2020. 8 by 8 by 7 mm (volume = 200 mm^3) nodule in the superior segment left lower lobe with some mild haziness of its peripheral contour shown on image 49 of series 4. This previously measured 7 by 8 by 8 mm, roughly stable. However, the nodule has increased in density  from prior, for example comparing image 103 of series 6 0 2 image 115 of series 6. Measuring the central density of the nodule on these respective images, a region of interest measures -9 Hounsfield  units today and previously -340 Hounsfield units previously, reflecting this increase in density. Accordingly although size is similar, this is thought to represent some degree of progression. Upper Abdomen: Aortoiliac atherosclerotic vascular disease. 1.2 by 0.7 cm hypodense lesion in the left hepatic lobe on image 123 of series 2, similar to previous, most likely a cyst. Adrenal glands unremarkable. Musculoskeletal: Unremarkable IMPRESSION: 1. Although similar in size to 05/15/20, the previously subsolid 8 mm nodule has a more solid appearance, raising concern for low-grade adenocarcinoma. Consider resection. 2.  Aortic Atherosclerosis (ICD10-I70.0).  Coronary atherosclerosis. Electronically Signed   By: Van Clines M.D.   On: 02/28/2021 17:04    ASSESSMENT & PLAN:  Cancer of upper lobe of left lung ([place]) I have reviewed her CT imaging with the patient's daughter The lung nodule is highly suspicious for either primary lung cancer or metastatic cancer to the lung, given her cancer history I recommend PET CT scan for staging I recommend referral to cardiothoracic surgeon for possible resection The patient has quit smoking as of yesterday and I congratulated her effort  Cancer of ascending colon ([place]) I have reviewed her last colonoscopy report Pathology from biopsy was nonmalignant She has no new symptoms  Orders Placed This Encounter  Procedures   NM PET Image Initial (PI) Skull Base To Thigh    Standing Status:   Future    Standing Expiration Date:   03/06/2022    Order Specific Question:   If indicated for the ordered procedure, I authorize the administration of a radiopharmaceutical per Radiology protocol    Answer:   Yes    Order Specific Question:   Preferred imaging location?    Answer:   [facility]   Ambulatory referral to Cardiothoracic Surgery    Referral Priority:   Routine    Referral Type:   Surgical    Referral Reason:   Specialty Services Required    Referred to  Provider:   [name], MD    Requested Specialty:   Cardiothoracic Surgery    Number of Visits Requested:   1    All questions were answered. The patient knows to call the clinic with any problems, questions or concerns. The total time spent in the appointment was 30 minutes encounter with patients including review of chart and various tests results, discussions about plan of care and coordination of care plan   Heath Lark, MD 03/06/2021 12:16 PM

## 2021-03-06 NOTE — Assessment & Plan Note (Signed)
I have reviewed her CT imaging with the patient's daughter The lung nodule is highly suspicious for either primary lung cancer or metastatic cancer to the lung, given her cancer history I recommend PET CT scan for staging I recommend referral to cardiothoracic surgeon for possible resection The patient has quit smoking as of yesterday and I congratulated her effort

## 2021-03-06 NOTE — Assessment & Plan Note (Signed)
I have reviewed her last colonoscopy report Pathology from biopsy was nonmalignant She has no new symptoms

## 2021-03-11 ENCOUNTER — Other Ambulatory Visit (HOSPITAL_COMMUNITY): Payer: Self-pay

## 2021-03-11 DIAGNOSIS — C3412 Malignant neoplasm of upper lobe, left bronchus or lung: Secondary | ICD-10-CM

## 2021-03-11 DIAGNOSIS — C3490 Malignant neoplasm of unspecified part of unspecified bronchus or lung: Secondary | ICD-10-CM

## 2021-03-11 DIAGNOSIS — C182 Malignant neoplasm of ascending colon: Secondary | ICD-10-CM

## 2021-03-11 DIAGNOSIS — R911 Solitary pulmonary nodule: Secondary | ICD-10-CM

## 2021-03-11 DIAGNOSIS — C189 Malignant neoplasm of colon, unspecified: Secondary | ICD-10-CM

## 2021-03-27 ENCOUNTER — Other Ambulatory Visit: Payer: Self-pay

## 2021-03-27 ENCOUNTER — Ambulatory Visit (HOSPITAL_COMMUNITY)
Admission: RE | Admit: 2021-03-27 | Discharge: 2021-03-27 | Disposition: A | Discharge status: Home / Self Care | Payer: Medicare HMO | Source: Ambulatory Visit | Attending: Hematology and Oncology | Admitting: Hematology and Oncology

## 2021-03-27 ENCOUNTER — Institutional Professional Consult (permissible substitution): Payer: Medicare HMO | Admitting: Thoracic Surgery (Cardiothoracic Vascular Surgery)

## 2021-03-27 VITALS — BP 113/48 | HR 77 | Resp 20 | Ht 64.0 in | Wt 137.0 lb

## 2021-03-27 DIAGNOSIS — Z800 Family history of malignant neoplasm of digestive organs: Secondary | ICD-10-CM | POA: Diagnosis not present

## 2021-03-27 DIAGNOSIS — Z131 Encounter for screening for diabetes mellitus: Secondary | ICD-10-CM | POA: Diagnosis not present

## 2021-03-27 DIAGNOSIS — C3490 Malignant neoplasm of unspecified part of unspecified bronchus or lung: Secondary | ICD-10-CM | POA: Diagnosis not present

## 2021-03-27 DIAGNOSIS — R911 Solitary pulmonary nodule: Secondary | ICD-10-CM | POA: Diagnosis not present

## 2021-03-27 LAB — GLUCOSE, CAPILLARY: Glucose-Capillary: 95 mg/dL (ref 70–99)

## 2021-03-27 MED ORDER — FLUDEOXYGLUCOSE F - 18 (FDG) INJECTION
7.1000 | Freq: Once | INTRAVENOUS | Status: AC
  Administered 2021-03-27: 6.8 via INTRAVENOUS

## 2021-03-27 NOTE — H&P (View-Only) (Signed)
PCP is Default, Provider, MD Referring Provider is Heath Lark, MD  Chief Complaint  Patient presents with   Lung Lesion    Surgical consult, PET Scan- today, Chest CT 02/27/21     HPI: Sara Coffey is sent for consultation regarding a left lower lobe lung nodule.  Sara Coffey is a 70 year old woman with a history of tobacco abuse, hyperlipidemia, osteoporosis, syncope with transient global amnesia and possible transient ischemic attack, and colon cancer.  She had surgery for colon cancer a year ago.  She did not require adjuvant therapy.  In September 2021 she was noted to have a groundglass opacity in the superior segment of the left lower lobe.  It was about 7 mm in diameter.  Recently she had a follow-up CT that showed the nodule was about 8 mm in diameter but now had a solid component.  There was no adenopathy.  She smoked about a pack a day for 40 years.  She quit 2 weeks ago but has been vaping instead.  She denies any chest pain, pressure, or tightness.  She denies shortness of breath, wheezing, and cough.  She is active and push mows her own yard without any issues.  She has not had any change in appetite or weight loss.  Zubrod Score: At the time of surgery this patient's most appropriate activity status/level should be described as: [x]     0    Normal activity, no symptoms []     1    Restricted in physical strenuous activity but ambulatory, able to do out light work []     2    Ambulatory and capable of self care, unable to do work activities, up and about >50 % of waking hours                              []     3    Only limited self care, in bed greater than 50% of waking hours []     4    Completely disabled, no self care, confined to bed or chair []     5    Moribund  Past Medical History:  Diagnosis Date   Amnesia 04/17/2019   Hemorrhoid 09/28/2013   Hyperlipidemia    Osteoporosis    Syncope and collapse    TGA (transient global amnesia) 04/18/2019   TIA (transient ischemic  attack) 04/17/2019   Tobacco abuse     Past Surgical History:  Procedure Laterality Date   BIOPSY  03/02/2020   Procedure: BIOPSY;  Surgeon: Rogene Houston, MD;  Location: [facility];  Service: Endoscopy;;  ascending colon mass   COLONOSCOPY WITH PROPOFOL N/A 03/02/2020   Procedure: COLONOSCOPY WITH PROPOFOL;  Surgeon: Rogene Houston, MD;  Location: [facility];  Service: Endoscopy;  Laterality: N/A;  1055   COLONOSCOPY WITH PROPOFOL N/A 01/09/2021   Procedure: COLONOSCOPY WITH PROPOFOL;  Surgeon: Rogene Houston, MD;  Location: [facility];  Service: Endoscopy;  Laterality: N/A;  945   KNEE SX     LAPAROSCOPIC PARTIAL COLECTOMY N/A 04/09/2020   Procedure: LAPAROSCOPIC PARTIAL COLECTOMY;  Surgeon: Virl Cagey, MD;  Location: [facility];  Service: General;  Laterality: N/A;   left eye surgery      POLYPECTOMY  03/02/2020   Procedure: POLYPECTOMY;  Surgeon: Rogene Houston, MD;  [facility]: [facility];  Service: Endoscopy;;   POLYPECTOMY  01/09/2021   Procedure: POLYPECTOMY;  Surgeon: Laural Golden,  Mechele Dawley, MD;  Location: [facility];  Service: Endoscopy;;   TUBAL LIGATION      Family History  Problem Relation Age of Onset   Heart disease Father    Hypertension Father    Diabetes Father    Cancer Brother    Healthy Son    Healthy Son    Healthy Daughter    Ulcers Mother    Kidney failure Maternal Grandfather    Throat cancer Paternal Grandfather    Cancer Brother    Breast cancer Neg Hx     Social History Social History   Tobacco Use   Smoking status: Every Day    Packs/day: 1.00    Years: 40.00    Pack years: 40.00    Types: Cigarettes   Smokeless tobacco: Never  Vaping Use   Vaping Use: Never used  Substance Use Topics   Alcohol use: No   Drug use: No    Current Outpatient Medications  Medication Sig Dispense Refill   Calcium Carb-Cholecalciferol (CALCIUM-VITAMIN D) 600-400 MG-UNIT TABS Take 1 tablet by mouth every other day.     calcium carbonate  (TUMS - DOSED IN MG ELEMENTAL CALCIUM) 500 MG chewable tablet Chew 1 tablet by mouth as needed for indigestion or heartburn.     Ferrous Sulfate (IRON) 325 (65 Fe) MG TABS Take 325 mg by mouth every other day. 1 every other day     Melatonin 5 MG TABS Take 5 mg by mouth at bedtime as needed (sleep).     Multiple Vitamins-Minerals (MULTIVITAMIN WITH MINERALS) tablet Take 1 tablet by mouth daily.     Omega-3 Fatty Acids (FISH OIL) 1000 MG CAPS Take 2,000 mg by mouth in the morning and at bedtime.     rosuvastatin (CRESTOR) 5 MG tablet Take 1 tablet (5 mg total) by mouth daily. 90 tablet 1   No current facility-administered medications for this visit.    No Known Allergies  Review of Systems  Constitutional:  Negative for activity change, appetite change and unexpected weight change.  Respiratory:  Negative for cough, chest tightness, shortness of breath and wheezing.   Cardiovascular:  Negative for chest pain and leg swelling.  Gastrointestinal:  Negative for abdominal distention and abdominal pain.  Genitourinary:  Negative for difficulty urinating and dysuria.  Neurological:  Positive for syncope (2 years ago). Negative for seizures and weakness.       Transient global amnesia 2 years ago  All other systems reviewed and are negative.  BP (!) 113/48   Pulse 77   Resp 20   Ht 5\' 4"  (1.626 m)   Wt 137 lb (62.1 kg)   SpO2 96% Comment: RA  BMI 23.52 kg/m  Physical Exam Vitals reviewed.  Constitutional:      General: She is not in acute distress.    Appearance: Normal appearance.  HENT:     Head: Normocephalic and atraumatic.  Eyes:     General: No scleral icterus.    Extraocular Movements: Extraocular movements intact.  Neck:     Vascular: No carotid bruit.  Cardiovascular:     Rate and Rhythm: Normal rate and regular rhythm.     Heart sounds: Normal heart sounds. No murmur heard.   No friction rub. No gallop.  Pulmonary:     Effort: Pulmonary effort is normal. No  respiratory distress.     Breath sounds: Normal breath sounds. No wheezing or rales.  Abdominal:     General: There is no distension.  Palpations: Abdomen is soft.     Comments: Well-healed surgical scars  Musculoskeletal:     Cervical back: Neck supple.  Skin:    General: Skin is warm and dry.  Neurological:     General: No focal deficit present.     Mental Status: She is alert and oriented to person, place, and time.     Cranial Nerves: No cranial nerve deficit.     Motor: No weakness.     Gait: Gait normal.    Diagnostic Tests: CT CHEST WITHOUT CONTRAST   TECHNIQUE: Multidetector CT imaging of the chest was performed following the standard protocol without IV contrast.   COMPARISON:  05/15/2020   FINDINGS: Cardiovascular: Mild atherosclerotic calcification of the thoracic aorta and left anterior descending coronary artery.   Mediastinum/Nodes: Lower right paratracheal lymph node anterior to the carina measures 0.9 cm in short axis on image 57 series 2, previously the same.   Lungs/Pleura: Mild biapical pleuroparenchymal scarring.   2 by 3 mm right lower lobe pulmonary nodule on image 76 series 4, no change from 05/15/2020.   8 by 8 by 7 mm (volume = 200 mm^3) nodule in the superior segment left lower lobe with some mild haziness of its peripheral contour shown on image 49 of series 4. This previously measured 7 by 8 by 8 mm, roughly stable. However, the nodule has increased in density from prior, for example comparing image 103 of series 6 0 2 image 115 of series 6. Measuring the central density of the nodule on these respective images, a region of interest measures -9 Hounsfield units today and previously -340 Hounsfield units previously, reflecting this increase in density. Accordingly although size is similar, this is thought to represent some degree of progression.   Upper Abdomen: Aortoiliac atherosclerotic vascular disease. 1.2 by 0.7 cm hypodense  lesion in the left hepatic lobe on image 123 of series 2, similar to previous, most likely a cyst. Adrenal glands unremarkable.   Musculoskeletal: Unremarkable   IMPRESSION: 1. Although similar in size to 05/15/20, the previously subsolid 8 mm nodule has a more solid appearance, raising concern for low-grade adenocarcinoma. Consider resection. 2.  Aortic Atherosclerosis (ICD10-I70.0).  Coronary atherosclerosis.     Electronically Signed   By: Van Clines M.D.   On: 02/28/2021 17:04   I personally reviewed the CT images and compared them to the films from September 2021.  There is an 8 mm mixed density nodule in the superior segment of the left lower lobe.  Impression: Sara Coffey is a 70 year old woman with a history of tobacco abuse, hyperlipidemia, osteoporosis, syncope with transient global amnesia and possible transient ischemic attack, and colon cancer.  About a year ago she had a right hemicolectomy for a T3, N0 colon cancer.  She did not require any adjuvant therapy.  Back in September she was found to have a groundglass opacity in the superior segment of her left lower lobe.  Follow-up CT that nodule had possibly slightly grown in size but of more significance there was a new solid component.  This lesion is highly suspicious for a new primary bronchogenic carcinoma.  This would be a very unusual appearance for metastatic disease and also would be an unusual site of metastasis with node negative ascending colon lesion.  We discussed the differential diagnosis which includes primary bronchogenic carcinoma as well as infectious and inflammatory nodules.  We discussed the possibility of doing a biopsy.  Given the small size of the nodule if the  biopsy was negative I would not rely on that and would recommend proceeding with surgery anyway.  Therefore, I recommended that we proceed directly to surgical resection.  I described the proposed operative procedure to Sara Coffey and  her daughter.  We would plan to do a robotic assisted left lower lobe superior segmentectomy.  That will give [place] definitive diagnosis and treatment at the same setting.  It would be unlikely she would need any adjuvant therapy.  I informed him of the general nature of the procedure including the need for general anesthesia, the incisions to be used, the need for a drainage tube postoperatively, the expected hospital stay, and the overall recovery.  I informed him of the indications, risks, benefits, and alternatives.  They understand the risks include, but not limited to death, MI, DVT, PE, bleeding, possible need for transfusion, infection, prolonged air leak, cardiac arrhythmias, as well as possibility of other unforeseeable complications.  She understands accepts the risk and agrees to proceed.  Tobacco abuse-quit smoking about 2 weeks ago but is vaping.  I congratulated her for quitting, but I do not think vaping is a safe alternative and she will need to find another way going forward.  Thoracic aortic atherosclerosis-fairly extensive based on CT.  Asymptomatic.  She is on Crestor.  Smoking cessation and avoidance of nicotine altogether is necessary as well.   Plan: PFTs with and without bronchodilators We will call her to schedule robotic left lower lobe superior segmentectomy sometime in the next couple of weeks.  Sara Nakayama, MD [facility] [phone]

## 2021-03-27 NOTE — Progress Notes (Signed)
PCP is Default, Provider, MD Referring Provider is Heath Lark, MD  Chief Complaint  Patient presents with   Lung Lesion    Surgical consult, PET Scan- today, Chest CT 02/27/21     HPI: [name] is sent for consultation regarding a left lower lobe lung nodule.  Sara Coffey is a 70 year old woman with a history of tobacco abuse, hyperlipidemia, osteoporosis, syncope with transient global amnesia and possible transient ischemic attack, and colon cancer.  She had surgery for colon cancer a year ago.  She did not require adjuvant therapy.  In September 2021 she was noted to have a groundglass opacity in the superior segment of the left lower lobe.  It was about 7 mm in diameter.  Recently she had a follow-up CT that showed the nodule was about 8 mm in diameter but now had a solid component.  There was no adenopathy.  She smoked about a pack a day for 40 years.  She quit 2 weeks ago but has been vaping instead.  She denies any chest pain, pressure, or tightness.  She denies shortness of breath, wheezing, and cough.  She is active and push mows her own yard without any issues.  She has not had any change in appetite or weight loss.  Zubrod Score: At the time of surgery this patient's most appropriate activity status/level should be described as: [x]     0    Normal activity, no symptoms []     1    Restricted in physical strenuous activity but ambulatory, able to do out light work []     2    Ambulatory and capable of self care, unable to do work activities, up and about >50 % of waking hours                              []     3    Only limited self care, in bed greater than 50% of waking hours []     4    Completely disabled, no self care, confined to bed or chair []     5    Moribund  Past Medical History:  Diagnosis Date   Amnesia 04/17/2019   Hemorrhoid 09/28/2013   Hyperlipidemia    Osteoporosis    Syncope and collapse    TGA (transient global amnesia) 04/18/2019   TIA (transient ischemic  attack) 04/17/2019   Tobacco abuse     Past Surgical History:  Procedure Laterality Date   BIOPSY  03/02/2020   Procedure: BIOPSY;  Surgeon: Rogene Houston, MD;  Location: [facility];  Service: Endoscopy;;  ascending colon mass   COLONOSCOPY WITH PROPOFOL N/A 03/02/2020   Procedure: COLONOSCOPY WITH PROPOFOL;  Surgeon: Rogene Houston, MD;  Location: [facility];  Service: Endoscopy;  Laterality: N/A;  1055   COLONOSCOPY WITH PROPOFOL N/A 01/09/2021   Procedure: COLONOSCOPY WITH PROPOFOL;  Surgeon: Rogene Houston, MD;  [facility]: [facility];  Service: Endoscopy;  Laterality: N/A;  945   KNEE SX     LAPAROSCOPIC PARTIAL COLECTOMY N/A 04/09/2020   Procedure: LAPAROSCOPIC PARTIAL COLECTOMY;  Surgeon: Virl Cagey, MD;  Location: [facility];  Service: General;  Laterality: N/A;   left eye surgery      POLYPECTOMY  03/02/2020   Procedure: POLYPECTOMY;  Surgeon: Rogene Houston, MD;  [facility]: [facility];  Service: Endoscopy;;   POLYPECTOMY  01/09/2021   Procedure: POLYPECTOMY;  Surgeon: Laural Golden,  Mechele Dawley, MD;  [facility]: [facility];  Service: Endoscopy;;   TUBAL LIGATION      Family History  Problem Relation Age of Onset   Heart disease Father    Hypertension Father    Diabetes Father    Cancer Brother    Healthy Son    Healthy Son    Healthy Daughter    Ulcers Mother    Kidney failure Maternal Grandfather    Throat cancer Paternal Grandfather    Cancer Brother    Breast cancer Neg Hx     Social History Social History   Tobacco Use   Smoking status: Every Day    Packs/day: 1.00    Years: 40.00    Pack years: 40.00    Types: Cigarettes   Smokeless tobacco: Never  Vaping Use   Vaping Use: Never used  Substance Use Topics   Alcohol use: No   Drug use: No    Current Outpatient Medications  Medication Sig Dispense Refill   Calcium Carb-Cholecalciferol (CALCIUM-VITAMIN D) 600-400 MG-UNIT TABS Take 1 tablet by mouth every other day.     calcium carbonate  (TUMS - DOSED IN MG ELEMENTAL CALCIUM) 500 MG chewable tablet Chew 1 tablet by mouth as needed for indigestion or heartburn.     Ferrous Sulfate (IRON) 325 (65 Fe) MG TABS Take 325 mg by mouth every other day. 1 every other day     Melatonin 5 MG TABS Take 5 mg by mouth at bedtime as needed (sleep).     Multiple Vitamins-Minerals (MULTIVITAMIN WITH MINERALS) tablet Take 1 tablet by mouth daily.     Omega-3 Fatty Acids (FISH OIL) 1000 MG CAPS Take 2,000 mg by mouth in the morning and at bedtime.     rosuvastatin (CRESTOR) 5 MG tablet Take 1 tablet (5 mg total) by mouth daily. 90 tablet 1   No current facility-administered medications for this visit.    No Known Allergies  Review of Systems  Constitutional:  Negative for activity change, appetite change and unexpected weight change.  Respiratory:  Negative for cough, chest tightness, shortness of breath and wheezing.   Cardiovascular:  Negative for chest pain and leg swelling.  Gastrointestinal:  Negative for abdominal distention and abdominal pain.  Genitourinary:  Negative for difficulty urinating and dysuria.  Neurological:  Positive for syncope (2 years ago). Negative for seizures and weakness.       Transient global amnesia 2 years ago  All other systems reviewed and are negative.  BP (!) 113/48   Pulse 77   Resp 20   Ht 5\' 4"  (1.626 m)   Wt 137 lb (62.1 kg)   SpO2 96% Comment: RA  BMI 23.52 kg/m  Physical Exam Vitals reviewed.  Constitutional:      General: She is not in acute distress.    Appearance: Normal appearance.  HENT:     Head: Normocephalic and atraumatic.  Eyes:     General: No scleral icterus.    Extraocular Movements: Extraocular movements intact.  Neck:     Vascular: No carotid bruit.  Cardiovascular:     Rate and Rhythm: Normal rate and regular rhythm.     Heart sounds: Normal heart sounds. No murmur heard.   No friction rub. No gallop.  Pulmonary:     Effort: Pulmonary effort is normal. No  respiratory distress.     Breath sounds: Normal breath sounds. No wheezing or rales.  Abdominal:     General: There is no distension.  Palpations: Abdomen is soft.     Comments: Well-healed surgical scars  Musculoskeletal:     Cervical back: Neck supple.  Skin:    General: Skin is warm and dry.  Neurological:     General: No focal deficit present.     Mental Status: She is alert and oriented to person, place, and time.     Cranial Nerves: No cranial nerve deficit.     Motor: No weakness.     Gait: Gait normal.    Diagnostic Tests: CT CHEST WITHOUT CONTRAST   TECHNIQUE: Multidetector CT imaging of the chest was performed following the standard protocol without IV contrast.   COMPARISON:  05/15/2020   FINDINGS: Cardiovascular: Mild atherosclerotic calcification of the thoracic aorta and left anterior descending coronary artery.   Mediastinum/Nodes: Lower right paratracheal lymph node anterior to the carina measures 0.9 cm in short axis on image 57 series 2, previously the same.   Lungs/Pleura: Mild biapical pleuroparenchymal scarring.   2 by 3 mm right lower lobe pulmonary nodule on image 76 series 4, no change from 05/15/2020.   8 by 8 by 7 mm (volume = 200 mm^3) nodule in the superior segment left lower lobe with some mild haziness of its peripheral contour shown on image 49 of series 4. This previously measured 7 by 8 by 8 mm, roughly stable. However, the nodule has increased in density from prior, for example comparing image 103 of series 6 0 2 image 115 of series 6. Measuring the central density of the nodule on these respective images, a region of interest measures -9 Hounsfield units today and previously -340 Hounsfield units previously, reflecting this increase in density. Accordingly although size is similar, this is thought to represent some degree of progression.   Upper Abdomen: Aortoiliac atherosclerotic vascular disease. 1.2 by 0.7 cm hypodense  lesion in the left hepatic lobe on image 123 of series 2, similar to previous, most likely a cyst. Adrenal glands unremarkable.   Musculoskeletal: Unremarkable   IMPRESSION: 1. Although similar in size to 05/15/20, the previously subsolid 8 mm nodule has a more solid appearance, raising concern for low-grade adenocarcinoma. Consider resection. 2.  Aortic Atherosclerosis (ICD10-I70.0).  Coronary atherosclerosis.     Electronically Signed   By: Van Clines M.D.   On: 02/28/2021 17:04   I personally reviewed the CT images and compared them to the films from September 2021.  There is an 8 mm mixed density nodule in the superior segment of the left lower lobe.  Impression: Sara Coffey is a 70 year old woman with a history of tobacco abuse, hyperlipidemia, osteoporosis, syncope with transient global amnesia and possible transient ischemic attack, and colon cancer.  About a year ago she had a right hemicolectomy for a T3, N0 colon cancer.  She did not require any adjuvant therapy.  Back in September she was found to have a groundglass opacity in the superior segment of her left lower lobe.  Follow-up CT that nodule had possibly slightly grown in size but of more significance there was a new solid component.  This lesion is highly suspicious for a new primary bronchogenic carcinoma.  This would be a very unusual appearance for metastatic disease and also would be an unusual site of metastasis with node negative ascending colon lesion.  We discussed the differential diagnosis which includes primary bronchogenic carcinoma as well as infectious and inflammatory nodules.  We discussed the possibility of doing a biopsy.  Given the small size of the nodule if the  biopsy was negative I would not rely on that and would recommend proceeding with surgery anyway.  Therefore, I recommended that we proceed directly to surgical resection.  I described the proposed operative procedure to Mrs. Osorto and  her daughter.  We would plan to do a robotic assisted left lower lobe superior segmentectomy.  That will give [place] definitive diagnosis and treatment at the same setting.  It would be unlikely she would need any adjuvant therapy.  I informed him of the general nature of the procedure including the need for general anesthesia, the incisions to be used, the need for a drainage tube postoperatively, the expected hospital stay, and the overall recovery.  I informed him of the indications, risks, benefits, and alternatives.  They understand the risks include, but not limited to death, MI, DVT, PE, bleeding, possible need for transfusion, infection, prolonged air leak, cardiac arrhythmias, as well as possibility of other unforeseeable complications.  She understands accepts the risk and agrees to proceed.  Tobacco abuse-quit smoking about 2 weeks ago but is vaping.  I congratulated her for quitting, but I do not think vaping is a safe alternative and she will need to find another way going forward.  Thoracic aortic atherosclerosis-fairly extensive based on CT.  Asymptomatic.  She is on Crestor.  Smoking cessation and avoidance of nicotine altogether is necessary as well.   Plan: PFTs with and without bronchodilators We will call her to schedule robotic left lower lobe superior segmentectomy sometime in the next couple of weeks.  [name], MD [facility] [phone]

## 2021-03-28 ENCOUNTER — Encounter: Payer: Self-pay | Admitting: *Deleted

## 2021-03-28 ENCOUNTER — Encounter: Payer: Medicare HMO | Admitting: Thoracic Surgery (Cardiothoracic Vascular Surgery)

## 2021-03-28 ENCOUNTER — Other Ambulatory Visit: Payer: Self-pay | Admitting: *Deleted

## 2021-03-28 DIAGNOSIS — R911 Solitary pulmonary nodule: Secondary | ICD-10-CM

## 2021-04-01 ENCOUNTER — Other Ambulatory Visit (HOSPITAL_COMMUNITY): Payer: Self-pay

## 2021-04-01 DIAGNOSIS — R911 Solitary pulmonary nodule: Secondary | ICD-10-CM

## 2021-04-01 DIAGNOSIS — C189 Malignant neoplasm of colon, unspecified: Secondary | ICD-10-CM

## 2021-04-01 DIAGNOSIS — C182 Malignant neoplasm of ascending colon: Secondary | ICD-10-CM

## 2021-04-01 NOTE — Progress Notes (Signed)
Patient called with results of PET scan per Dr. Delton Coombes. Patient to be scheduled in 6 months for labs and repeat CT CAP. Patient aware and verbalized understanding.

## 2021-04-02 NOTE — [facility] Notes (Signed)
Surgical Instructions    Your procedure is scheduled on Friday August 19th.  Report to [facility] Main Entrance "A" at 7:40 A.M., then check in with the Admitting office.  Call this number if you have problems the morning of surgery:  [phone]   If you have any questions prior to your surgery date call [phone]: Open Monday-Friday 8am-4pm    Remember:  Do not eat or drink anything after midnight the night before your surgery   Take these medicines the morning of surgery with A SIP OF WATER  rosuvastatin (CRESTOR) 5 MG tablet   As of today, STOP taking any Aspirin (unless otherwise instructed by your surgeon) Aleve, Naproxen, Ibuprofen, Motrin, Advil, Goody's, BC's, all herbal medications, fish oil, and all vitamins.          Do not wear jewelry or makeup Do not wear lotions, powders, perfumes, or deodorant. Do not shave 48 hours prior to surgery.   Do not bring valuables to the hospital. DO Not wear nail polish, gel polish, artificial nails, or any other type of covering on  natural nails including finger and toenails. If patients have artificial nails, gel coating, etc. that need to be removed by a nail salon please have this removed prior to surgery or surgery may need to be canceled/delayed if the surgeon/ anesthesia feels like the patient is unable to be adequately monitored.             [facility] is not responsible for any belongings or valuables.  Do NOT Smoke (Tobacco/Vaping) or drink Alcohol 24 hours prior to your procedure If you use a CPAP at night, you may bring all equipment for your overnight stay.   Contacts, glasses, dentures or bridgework may not be worn into surgery, please bring cases for these belongings   For patients admitted to the hospital, discharge time will be determined by your treatment team.   Patients discharged the day of surgery will not be allowed to drive home, and someone needs to stay with them for 24 hours.  ONLY 1 SUPPORT PERSON  MAY BE PRESENT WHILE YOU ARE IN SURGERY. IF YOU ARE TO BE ADMITTED ONCE YOU ARE IN YOUR ROOM YOU WILL BE ALLOWED TWO (2) VISITORS.  Minor children may have two parents present. Special consideration for safety and communication needs will be reviewed on a case by case basis.  Special instructions:    Oral Hygiene is also important to reduce your risk of infection.  Remember - BRUSH YOUR TEETH THE MORNING OF SURGERY WITH YOUR REGULAR TOOTHPASTE   [place]- Preparing For Surgery  Before surgery, you can play an important role. Because skin is not sterile, your skin needs to be as free of germs as possible. You can reduce the number of germs on your skin by washing with CHG (chlorahexidine gluconate) Soap before surgery.  CHG is an antiseptic cleaner which kills germs and bonds with the skin to continue killing germs even after washing.     Please do not use if you have an allergy to CHG or antibacterial soaps. If your skin becomes reddened/irritated stop using the CHG.  Do not shave (including legs and underarms) for at least 48 hours prior to first CHG shower. It is OK to shave your face.  Please follow these instructions carefully.     Shower the NIGHT BEFORE SURGERY and the MORNING OF SURGERY with CHG Soap.   If you chose to wash your hair, wash your hair first as  usual with your normal shampoo. After you shampoo, rinse your hair and body thoroughly to remove the shampoo.  Then [employer] and genitals (private parts) with your normal soap and rinse thoroughly to remove soap.  After that Use CHG Soap as you would any other liquid soap. You can apply CHG directly to the skin and wash gently with a scrungie or a clean washcloth.   Apply the CHG Soap to your body ONLY FROM THE NECK DOWN.  Do not use on open wounds or open sores. Avoid contact with your eyes, ears, mouth and genitals (private parts). Wash Face and genitals (private parts)  with your normal soap.   Wash thoroughly, paying  special attention to the area where your surgery will be performed.  Thoroughly rinse your body with warm water from the neck down.  DO NOT shower/wash with your normal soap after using and rinsing off the CHG Soap.  Pat yourself dry with a CLEAN TOWEL.  Wear CLEAN PAJAMAS to bed the night before surgery  Place CLEAN SHEETS on your bed the night before your surgery  DO NOT SLEEP WITH PETS.   Day of Surgery:  Take a shower with CHG soap. Wear Clean/Comfortable clothing the morning of surgery Do not apply any deodorants/lotions.   Remember to brush your teeth WITH YOUR REGULAR TOOTHPASTE.   Please read over the following fact sheets that you were given.

## 2021-04-03 ENCOUNTER — Encounter (HOSPITAL_COMMUNITY)
Admission: RE | Admit: 2021-04-03 | Discharge: 2021-04-03 | Disposition: A | Discharge status: Home / Self Care | Payer: Medicare HMO | Source: Ambulatory Visit | Attending: Thoracic Surgery (Cardiothoracic Vascular Surgery) | Admitting: Thoracic Surgery (Cardiothoracic Vascular Surgery)

## 2021-04-03 ENCOUNTER — Ambulatory Visit (HOSPITAL_COMMUNITY)
Admission: RE | Admit: 2021-04-03 | Discharge: 2021-04-03 | Disposition: A | Discharge status: Home / Self Care | Payer: Medicare HMO | Source: Ambulatory Visit | Attending: Thoracic Surgery (Cardiothoracic Vascular Surgery) | Admitting: Thoracic Surgery (Cardiothoracic Vascular Surgery)

## 2021-04-03 ENCOUNTER — Other Ambulatory Visit: Payer: Self-pay

## 2021-04-03 ENCOUNTER — Encounter (HOSPITAL_COMMUNITY): Payer: Self-pay

## 2021-04-03 DIAGNOSIS — C3432 Malignant neoplasm of lower lobe, left bronchus or lung: Secondary | ICD-10-CM | POA: Diagnosis not present

## 2021-04-03 DIAGNOSIS — Z01818 Encounter for other preprocedural examination: Secondary | ICD-10-CM | POA: Insufficient documentation

## 2021-04-03 DIAGNOSIS — F17210 Nicotine dependence, cigarettes, uncomplicated: Secondary | ICD-10-CM | POA: Insufficient documentation

## 2021-04-03 DIAGNOSIS — R001 Bradycardia, unspecified: Secondary | ICD-10-CM | POA: Insufficient documentation

## 2021-04-03 DIAGNOSIS — I2510 Atherosclerotic heart disease of native coronary artery without angina pectoris: Secondary | ICD-10-CM | POA: Diagnosis not present

## 2021-04-03 DIAGNOSIS — R911 Solitary pulmonary nodule: Secondary | ICD-10-CM | POA: Insufficient documentation

## 2021-04-03 DIAGNOSIS — Z79899 Other long term (current) drug therapy: Secondary | ICD-10-CM | POA: Diagnosis not present

## 2021-04-03 DIAGNOSIS — J9571 Accidental puncture and laceration of a respiratory system organ or structure during a respiratory system procedure: Secondary | ICD-10-CM | POA: Diagnosis not present

## 2021-04-03 DIAGNOSIS — E785 Hyperlipidemia, unspecified: Secondary | ICD-10-CM | POA: Diagnosis not present

## 2021-04-03 DIAGNOSIS — M810 Age-related osteoporosis without current pathological fracture: Secondary | ICD-10-CM | POA: Diagnosis not present

## 2021-04-03 DIAGNOSIS — I700 Atherosclerosis of aorta: Secondary | ICD-10-CM | POA: Diagnosis not present

## 2021-04-03 DIAGNOSIS — R69 Illness, unspecified: Secondary | ICD-10-CM | POA: Diagnosis not present

## 2021-04-03 DIAGNOSIS — Z20822 Contact with and (suspected) exposure to covid-19: Secondary | ICD-10-CM | POA: Diagnosis not present

## 2021-04-03 DIAGNOSIS — D62 Acute posthemorrhagic anemia: Secondary | ICD-10-CM | POA: Diagnosis not present

## 2021-04-03 LAB — COMPREHENSIVE METABOLIC PANEL
ALT: 19 U/L (ref 0–44)
AST: 20 U/L (ref 15–41)
Albumin: 3.9 g/dL (ref 3.5–5.0)
Alkaline Phosphatase: 65 U/L (ref 38–126)
Anion gap: 8 (ref 5–15)
BUN: 8 mg/dL (ref 8–23)
CO2: 24 mmol/L (ref 22–32)
Calcium: 10 mg/dL (ref 8.9–10.3)
Chloride: 106 mmol/L (ref 98–111)
Creatinine, Ser: 0.82 mg/dL (ref 0.44–1.00)
GFR, Estimated: >60 mL/min (ref >60)
Glucose, Bld: 101 mg/dL — ABNORMAL HIGH (ref 70–99)
Potassium: 3.9 mmol/L (ref 3.5–5.1)
Sodium: 138 mmol/L (ref 135–145)
Total Bilirubin: 0.8 mg/dL (ref 0.3–1.2)
Total Protein: 6.6 g/dL (ref 6.5–8.1)

## 2021-04-03 LAB — URINALYSIS, ROUTINE W REFLEX MICROSCOPIC
Bacteria, UA: NONE SEEN
Bilirubin Urine: NEGATIVE
Glucose, UA: NEGATIVE mg/dL
Ketones, ur: NEGATIVE mg/dL
Leukocytes,Ua: NEGATIVE
Nitrite: NEGATIVE
Protein, ur: NEGATIVE mg/dL
Specific Gravity, Urine: 1.01 (ref 1.005–1.030)
pH: 6 (ref 5.0–8.0)

## 2021-04-03 LAB — CBC
HCT: 39.8 % (ref 36.0–46.0)
Hemoglobin: 13.7 g/dL (ref 12.0–15.0)
MCH: 31.7 pg (ref 26.0–34.0)
MCHC: 34.4 g/dL (ref 30.0–36.0)
MCV: 92.1 fL (ref 80.0–100.0)
Platelets: 251 10*3/uL (ref 150–400)
RBC: 4.32 MIL/uL (ref 3.87–5.11)
RDW: 12.1 % (ref 11.5–15.5)
WBC: 5 10*3/uL (ref 4.0–10.5)
nRBC: 0 % (ref 0.0–0.2)

## 2021-04-03 LAB — SURGICAL PCR SCREEN
MRSA, PCR: NEGATIVE
Staphylococcus aureus: NEGATIVE

## 2021-04-03 LAB — BLOOD GAS, ARTERIAL
Acid-base deficit: 0.3 mmol/L (ref 0.0–2.0)
Bicarbonate: 23.6 mmol/L (ref 20.0–28.0)
Drawn by: 602861
FIO2: 21
O2 Saturation: 97.8 %
Patient temperature: 37
pCO2 arterial: 36.5 mmHg (ref 32.0–48.0)
pH, Arterial: 7.426 (ref 7.350–7.450)
pO2, Arterial: 93.1 mmHg (ref 83.0–108.0)

## 2021-04-03 LAB — SARS CORONAVIRUS 2 (TAT 6-24 HRS): SARS Coronavirus 2: NEGATIVE

## 2021-04-03 LAB — PROTIME-INR
INR: 1 (ref 0.8–1.2)
Prothrombin Time: 12.9 seconds (ref 11.4–15.2)

## 2021-04-03 LAB — APTT: aPTT: 30 seconds (ref 24–36)

## 2021-04-03 NOTE — [facility] Notes (Signed)
PCP - [facility] Cardiologist - Angelena Sole MD  PPM/ICD - denies Device Orders -  Rep Notified -   Chest x-ray -04/03/21  EKG - 04/03/21 Stress Test -  ECHO - 04/18/19 Cardiac Cath -   Sleep Study - none CPAP -   Fasting Blood Sugar - n/a Checks Blood Sugar _____ times a day  Blood Thinner Instructions:n/a Aspirin Instructions:n/a  ERAS Protcol -no PRE-SURGERY Ensure or G2-   COVID TEST- yes-04/03/21   Anesthesia review: no  Patient denies shortness of breath, fever, cough and chest pain at PAT appointment   All instructions explained to the patient, with a verbal understanding of the material. Patient agrees to go over the instructions while at home for a better understanding. Patient also instructed to self quarantine after being tested for COVID-19. The opportunity to ask questions was provided.

## 2021-04-04 ENCOUNTER — Ambulatory Visit (HOSPITAL_COMMUNITY)
Admission: RE | Admit: 2021-04-04 | Discharge: 2021-04-04 | Disposition: A | Discharge status: Home / Self Care | Payer: Medicare HMO | Source: Ambulatory Visit | Attending: Thoracic Surgery (Cardiothoracic Vascular Surgery) | Admitting: Thoracic Surgery (Cardiothoracic Vascular Surgery)

## 2021-04-04 DIAGNOSIS — R911 Solitary pulmonary nodule: Secondary | ICD-10-CM

## 2021-04-04 LAB — PULMONARY FUNCTION TEST
DL/VA % pred: 83 %
DL/VA: 3.45 ml/min/mmHg/L
DLCO unc % pred: 91 %
DLCO unc: 18.03 ml/min/mmHg
FEF 25-75 Post: 2.66 L/sec
FEF 25-75 Pre: 2.59 L/sec
FEF2575-%Change-Post: 2 %
FEF2575-%Pred-Post: 130 %
FEF2575-%Pred-Pre: 126 %
FEV1-%Change-Post: 0 %
FEV1-%Pred-Post: 121 %
FEV1-%Pred-Pre: 120 %
FEV1-Post: 2.86 L
FEV1-Pre: 2.84 L
FEV1FVC-%Change-Post: 4 %
FEV1FVC-%Pred-Pre: 101 %
FEV6-%Change-Post: -3 %
FEV6-%Pred-Post: 117 %
FEV6-%Pred-Pre: 121 %
FEV6-Post: 3.49 L
FEV6-Pre: 3.61 L
FEV6FVC-%Change-Post: 0 %
FEV6FVC-%Pred-Post: 103 %
FEV6FVC-%Pred-Pre: 102 %
FVC-%Change-Post: -3 %
FVC-%Pred-Post: 113 %
FVC-%Pred-Pre: 118 %
FVC-Post: 3.53 L
FVC-Pre: 3.66 L
Post FEV1/FVC ratio: 81 %
Post FEV6/FVC ratio: 99 %
Pre FEV1/FVC ratio: 78 %
Pre FEV6/FVC Ratio: 99 %
RV % pred: 80 %
RV: 1.72 L
TLC % pred: 111 %
TLC: 5.66 L

## 2021-04-04 MED ORDER — ALBUTEROL SULFATE (2.5 MG/3ML) 0.083% IN NEBU
2.5000 mg | INHALATION_SOLUTION | Freq: Once | RESPIRATORY_TRACT | Status: AC
  Administered 2021-04-04: 2.5 mg via RESPIRATORY_TRACT

## 2021-04-05 ENCOUNTER — Inpatient Hospital Stay (HOSPITAL_COMMUNITY): Payer: Medicare HMO | Admitting: Anesthesiology

## 2021-04-05 ENCOUNTER — Other Ambulatory Visit: Payer: Self-pay

## 2021-04-05 ENCOUNTER — Encounter (HOSPITAL_COMMUNITY)
Admission: RE | Disposition: A | Discharge status: Home / Self Care | Payer: Self-pay | Source: Ambulatory Visit | Attending: Thoracic Surgery (Cardiothoracic Vascular Surgery)

## 2021-04-05 ENCOUNTER — Inpatient Hospital Stay (HOSPITAL_COMMUNITY): Payer: Medicare HMO

## 2021-04-05 ENCOUNTER — Encounter (HOSPITAL_COMMUNITY): Payer: Self-pay | Admitting: Thoracic Surgery (Cardiothoracic Vascular Surgery)

## 2021-04-05 ENCOUNTER — Inpatient Hospital Stay (HOSPITAL_COMMUNITY)
Admission: RE | Admit: 2021-04-05 | Discharge: 2021-04-13 | DRG: 164 | Disposition: A | Discharge status: Home / Self Care | Payer: Medicare HMO | Source: Ambulatory Visit | Attending: Thoracic Surgery (Cardiothoracic Vascular Surgery) | Admitting: Thoracic Surgery (Cardiothoracic Vascular Surgery)

## 2021-04-05 DIAGNOSIS — I2510 Atherosclerotic heart disease of native coronary artery without angina pectoris: Secondary | ICD-10-CM | POA: Diagnosis not present

## 2021-04-05 DIAGNOSIS — J9571 Accidental puncture and laceration of a respiratory system organ or structure during a respiratory system procedure: Secondary | ICD-10-CM | POA: Diagnosis not present

## 2021-04-05 DIAGNOSIS — Z9889 Other specified postprocedural states: Secondary | ICD-10-CM | POA: Diagnosis not present

## 2021-04-05 DIAGNOSIS — F17210 Nicotine dependence, cigarettes, uncomplicated: Secondary | ICD-10-CM | POA: Diagnosis present

## 2021-04-05 DIAGNOSIS — Z4682 Encounter for fitting and adjustment of non-vascular catheter: Secondary | ICD-10-CM | POA: Diagnosis not present

## 2021-04-05 DIAGNOSIS — Z85038 Personal history of other malignant neoplasm of large intestine: Secondary | ICD-10-CM | POA: Diagnosis not present

## 2021-04-05 DIAGNOSIS — Y658 Other specified misadventures during surgical and medical care: Secondary | ICD-10-CM | POA: Diagnosis not present

## 2021-04-05 DIAGNOSIS — J939 Pneumothorax, unspecified: Secondary | ICD-10-CM

## 2021-04-05 DIAGNOSIS — Z808 Family history of malignant neoplasm of other organs or systems: Secondary | ICD-10-CM | POA: Diagnosis not present

## 2021-04-05 DIAGNOSIS — I700 Atherosclerosis of aorta: Secondary | ICD-10-CM | POA: Diagnosis present

## 2021-04-05 DIAGNOSIS — Z8673 Personal history of transient ischemic attack (TIA), and cerebral infarction without residual deficits: Secondary | ICD-10-CM

## 2021-04-05 DIAGNOSIS — E785 Hyperlipidemia, unspecified: Secondary | ICD-10-CM | POA: Diagnosis not present

## 2021-04-05 DIAGNOSIS — Z833 Family history of diabetes mellitus: Secondary | ICD-10-CM

## 2021-04-05 DIAGNOSIS — D62 Acute posthemorrhagic anemia: Secondary | ICD-10-CM | POA: Diagnosis not present

## 2021-04-05 DIAGNOSIS — J90 Pleural effusion, not elsewhere classified: Secondary | ICD-10-CM

## 2021-04-05 DIAGNOSIS — Z902 Acquired absence of lung [part of]: Secondary | ICD-10-CM

## 2021-04-05 DIAGNOSIS — Z79899 Other long term (current) drug therapy: Secondary | ICD-10-CM

## 2021-04-05 DIAGNOSIS — Z85828 Personal history of other malignant neoplasm of skin: Secondary | ICD-10-CM | POA: Diagnosis not present

## 2021-04-05 DIAGNOSIS — R911 Solitary pulmonary nodule: Secondary | ICD-10-CM

## 2021-04-05 DIAGNOSIS — J439 Emphysema, unspecified: Secondary | ICD-10-CM | POA: Diagnosis not present

## 2021-04-05 DIAGNOSIS — M810 Age-related osteoporosis without current pathological fracture: Secondary | ICD-10-CM | POA: Diagnosis present

## 2021-04-05 DIAGNOSIS — J449 Chronic obstructive pulmonary disease, unspecified: Secondary | ICD-10-CM | POA: Diagnosis not present

## 2021-04-05 DIAGNOSIS — C3432 Malignant neoplasm of lower lobe, left bronchus or lung: Secondary | ICD-10-CM | POA: Diagnosis not present

## 2021-04-05 DIAGNOSIS — Z8249 Family history of ischemic heart disease and other diseases of the circulatory system: Secondary | ICD-10-CM

## 2021-04-05 DIAGNOSIS — I9789 Other postprocedural complications and disorders of the circulatory system, not elsewhere classified: Secondary | ICD-10-CM | POA: Diagnosis not present

## 2021-04-05 DIAGNOSIS — Z20822 Contact with and (suspected) exposure to covid-19: Secondary | ICD-10-CM | POA: Diagnosis not present

## 2021-04-05 DIAGNOSIS — J942 Hemothorax: Secondary | ICD-10-CM | POA: Diagnosis not present

## 2021-04-05 DIAGNOSIS — J984 Other disorders of lung: Secondary | ICD-10-CM | POA: Diagnosis not present

## 2021-04-05 DIAGNOSIS — R918 Other nonspecific abnormal finding of lung field: Secondary | ICD-10-CM | POA: Diagnosis not present

## 2021-04-05 DIAGNOSIS — J9811 Atelectasis: Secondary | ICD-10-CM | POA: Diagnosis not present

## 2021-04-05 DIAGNOSIS — R0489 Hemorrhage from other sites in respiratory passages: Secondary | ICD-10-CM | POA: Diagnosis not present

## 2021-04-05 DIAGNOSIS — R69 Illness, unspecified: Secondary | ICD-10-CM | POA: Diagnosis not present

## 2021-04-05 DIAGNOSIS — Z09 Encounter for follow-up examination after completed treatment for conditions other than malignant neoplasm: Secondary | ICD-10-CM

## 2021-04-05 HISTORY — PX: INTERCOSTAL NERVE BLOCK: SHX5021

## 2021-04-05 HISTORY — PX: THORACOTOMY: SHX5074

## 2021-04-05 HISTORY — PX: XI ROBOTIC ASSISTED THORACOSCOPY- SEGMENTECTOMY: SHX6881

## 2021-04-05 HISTORY — PX: LYMPH NODE DISSECTION: SHX5087

## 2021-04-05 SURGERY — RESECTION, LUNG, SEGMENTAL, ROBOT-ASSISTED
Anesthesia: General | Site: Chest | Laterality: Left

## 2021-04-05 MED ORDER — OXYCODONE HCL 5 MG PO TABS
5.0000 mg | ORAL_TABLET | ORAL | Status: DC | PRN
  Administered 2021-04-06 – 2021-04-07 (×5): 10 mg via ORAL
  Administered 2021-04-08 – 2021-04-11 (×8): 5 mg via ORAL
  Administered 2021-04-11: 10 mg via ORAL
  Filled 2021-04-05 (×2): qty 1
  Filled 2021-04-05 (×4): qty 2
  Filled 2021-04-05 (×2): qty 1
  Filled 2021-04-05 (×2): qty 2
  Filled 2021-04-05 (×2): qty 1
  Filled 2021-04-05: qty 2
  Filled 2021-04-05 (×2): qty 1

## 2021-04-05 MED ORDER — CALCIUM CARBONATE ANTACID 500 MG PO CHEW: 1.0000 | CHEWABLE_TABLET | ORAL | Status: DC | PRN

## 2021-04-05 MED ORDER — CEFAZOLIN SODIUM-DEXTROSE 2-3 GM-%(50ML) IV SOLR
INTRAVENOUS | Status: DC | PRN
Start: 2021-04-05 — End: 2021-04-05
  Administered 2021-04-05: 2 g via INTRAVENOUS

## 2021-04-05 MED ORDER — LIDOCAINE 2% (20 MG/ML) 5 ML SYRINGE
INTRAMUSCULAR | Status: DC | PRN
  Administered 2021-04-05: 60 mg via INTRAVENOUS

## 2021-04-05 MED ORDER — ACETAMINOPHEN 500 MG PO TABS
1000.0000 mg | ORAL_TABLET | Freq: Four times a day (QID) | ORAL | Status: DC
  Administered 2021-04-05 – 2021-04-09 (×13): 1000 mg via ORAL
  Filled 2021-04-05 (×16): qty 2

## 2021-04-05 MED ORDER — SODIUM CHLORIDE 0.9% FLUSH: 9.0000 mL | INTRAVENOUS | Status: DC | PRN

## 2021-04-05 MED ORDER — CHLORHEXIDINE GLUCONATE 0.12 % MT SOLN
15.0000 mL | Freq: Once | OROMUCOSAL | Status: AC
  Administered 2021-04-05: 15 mL via OROMUCOSAL

## 2021-04-05 MED ORDER — DEXTROSE-NACL 5-0.45 % IV SOLN: INTRAVENOUS | Status: DC

## 2021-04-05 MED ORDER — SUCCINYLCHOLINE CHLORIDE 200 MG/10ML IV SOSY
PREFILLED_SYRINGE | INTRAVENOUS | Status: AC
  Filled 2021-04-05: qty 10

## 2021-04-05 MED ORDER — ONDANSETRON HCL 4 MG/2ML IJ SOLN
4.0000 mg | Freq: Four times a day (QID) | INTRAMUSCULAR | Status: DC | PRN
  Administered 2021-04-07: 4 mg via INTRAVENOUS
  Filled 2021-04-05: qty 2

## 2021-04-05 MED ORDER — ONDANSETRON HCL 4 MG/2ML IJ SOLN
4.0000 mg | Freq: Four times a day (QID) | INTRAMUSCULAR | Status: DC | PRN
  Filled 2021-04-05: qty 2

## 2021-04-05 MED ORDER — BUPIVACAINE HCL (PF) 0.5 % IJ SOLN
INTRAMUSCULAR | Status: AC
  Filled 2021-04-05: qty 30

## 2021-04-05 MED ORDER — INDOCYANINE GREEN 25 MG IV SOLR
INTRAVENOUS | Status: AC
  Filled 2021-04-05: qty 10

## 2021-04-05 MED ORDER — HYDROMORPHONE 1 MG/ML IV SOLN
INTRAVENOUS | Status: AC
  Filled 2021-04-05: qty 30

## 2021-04-05 MED ORDER — ROCURONIUM BROMIDE 10 MG/ML (PF) SYRINGE
PREFILLED_SYRINGE | INTRAVENOUS | Status: DC | PRN
  Administered 2021-04-05: 40 mg via INTRAVENOUS
  Administered 2021-04-05: 10 mg via INTRAVENOUS
  Administered 2021-04-05 (×2): 20 mg via INTRAVENOUS

## 2021-04-05 MED ORDER — DIPHENHYDRAMINE HCL 12.5 MG/5ML PO ELIX
12.5000 mg | ORAL_SOLUTION | Freq: Four times a day (QID) | ORAL | Status: DC | PRN
  Filled 2021-04-05: qty 5

## 2021-04-05 MED ORDER — LACTATED RINGERS IV SOLN: INTRAVENOUS | Status: DC

## 2021-04-05 MED ORDER — MELATONIN 5 MG PO TABS
10.0000 mg | ORAL_TABLET | Freq: Every evening | ORAL | Status: DC | PRN
  Administered 2021-04-06 – 2021-04-13 (×4): 10 mg via ORAL
  Filled 2021-04-05 (×4): qty 2

## 2021-04-05 MED ORDER — PHENYLEPHRINE HCL-NACL 20-0.9 MG/250ML-% IV SOLN
INTRAVENOUS | Status: DC | PRN
  Administered 2021-04-05: 25 ug/min via INTRAVENOUS

## 2021-04-05 MED ORDER — ENOXAPARIN SODIUM 40 MG/0.4ML IJ SOSY: 40.0000 mg | PREFILLED_SYRINGE | INTRAMUSCULAR | Status: DC

## 2021-04-05 MED ORDER — CALCIUM CARBONATE-VITAMIN D 500-200 MG-UNIT PO TABS
1.0000 | ORAL_TABLET | ORAL | Status: DC
  Administered 2021-04-05 – 2021-04-13 (×5): 1 via ORAL
  Filled 2021-04-05 (×6): qty 1

## 2021-04-05 MED ORDER — SENNOSIDES-DOCUSATE SODIUM 8.6-50 MG PO TABS
1.0000 | ORAL_TABLET | Freq: Every day | ORAL | Status: DC
  Administered 2021-04-05 – 2021-04-12 (×5): 1 via ORAL
  Filled 2021-04-05 (×7): qty 1

## 2021-04-05 MED ORDER — HYDROMORPHONE 1 MG/ML IV SOLN
INTRAVENOUS | Status: DC
  Administered 2021-04-05: 30 mg via INTRAVENOUS
  Administered 2021-04-05: 0.9 mg via INTRAVENOUS
  Administered 2021-04-05: 0.4 mg via INTRAVENOUS
  Administered 2021-04-06: 2 mg via INTRAVENOUS
  Administered 2021-04-06: 1.4 mg via INTRAVENOUS

## 2021-04-05 MED ORDER — SUGAMMADEX SODIUM 200 MG/2ML IV SOLN: INTRAVENOUS | Status: DC | PRN

## 2021-04-05 MED ORDER — FENTANYL CITRATE (PF) 100 MCG/2ML IJ SOLN
INTRAMUSCULAR | Status: AC
  Filled 2021-04-05: qty 2

## 2021-04-05 MED ORDER — FENTANYL CITRATE (PF) 100 MCG/2ML IJ SOLN
25.0000 ug | INTRAMUSCULAR | Status: DC | PRN
  Administered 2021-04-05 (×2): 50 ug via INTRAVENOUS

## 2021-04-05 MED ORDER — ORAL CARE MOUTH RINSE: 15.0000 mL | Freq: Once | OROMUCOSAL | Status: DC

## 2021-04-05 MED ORDER — ONDANSETRON HCL 4 MG/2ML IJ SOLN
INTRAMUSCULAR | Status: DC | PRN
  Administered 2021-04-05: 4 mg via INTRAVENOUS

## 2021-04-05 MED ORDER — FERROUS SULFATE 325 (65 FE) MG PO TABS
325.0000 mg | ORAL_TABLET | ORAL | Status: DC
  Administered 2021-04-05 – 2021-04-13 (×5): 325 mg via ORAL
  Filled 2021-04-05 (×5): qty 1

## 2021-04-05 MED ORDER — SODIUM CHLORIDE FLUSH 0.9 % IV SOLN
INTRAVENOUS | Status: DC | PRN
  Administered 2021-04-05: 85 mL

## 2021-04-05 MED ORDER — AMISULPRIDE (ANTIEMETIC) 5 MG/2ML IV SOLN: 10.0000 mg | Freq: Once | INTRAVENOUS | Status: DC | PRN

## 2021-04-05 MED ORDER — FENTANYL CITRATE (PF) 250 MCG/5ML IJ SOLN
INTRAMUSCULAR | Status: AC
  Filled 2021-04-05: qty 5

## 2021-04-05 MED ORDER — PHENYLEPHRINE 40 MCG/ML (10ML) SYRINGE FOR IV PUSH (FOR BLOOD PRESSURE SUPPORT)
PREFILLED_SYRINGE | INTRAVENOUS | Status: DC | PRN
  Administered 2021-04-05: 120 ug via INTRAVENOUS
  Administered 2021-04-05: 80 ug via INTRAVENOUS
  Administered 2021-04-05: 40 ug via INTRAVENOUS
  Administered 2021-04-05: 160 ug via INTRAVENOUS

## 2021-04-05 MED ORDER — ONDANSETRON HCL 4 MG/2ML IJ SOLN
INTRAMUSCULAR | Status: AC
  Filled 2021-04-05: qty 2

## 2021-04-05 MED ORDER — BUPIVACAINE LIPOSOME 1.3 % IJ SUSP
INTRAMUSCULAR | Status: AC
  Filled 2021-04-05: qty 20

## 2021-04-05 MED ORDER — ACETAMINOPHEN 500 MG PO TABS: 1000.0000 mg | ORAL_TABLET | Freq: Once | ORAL | Status: DC

## 2021-04-05 MED ORDER — SODIUM CHLORIDE 0.9 % IR SOLN
Status: DC | PRN
  Administered 2021-04-05: 1000 mL

## 2021-04-05 MED ORDER — PROPOFOL 10 MG/ML IV BOLUS
INTRAVENOUS | Status: DC | PRN
  Administered 2021-04-05: 140 mg via INTRAVENOUS

## 2021-04-05 MED ORDER — SUGAMMADEX SODIUM 200 MG/2ML IV SOLN
INTRAVENOUS | Status: DC | PRN
  Administered 2021-04-05: 150 mg via INTRAVENOUS

## 2021-04-05 MED ORDER — DIPHENHYDRAMINE HCL 50 MG/ML IJ SOLN
12.5000 mg | Freq: Four times a day (QID) | INTRAMUSCULAR | Status: DC | PRN
  Filled 2021-04-05: qty 0.25

## 2021-04-05 MED ORDER — EPHEDRINE SULFATE-NACL 50-0.9 MG/10ML-% IV SOSY
PREFILLED_SYRINGE | INTRAVENOUS | Status: DC | PRN
  Administered 2021-04-05 (×3): 5 mg via INTRAVENOUS

## 2021-04-05 MED ORDER — CHLORHEXIDINE GLUCONATE 0.12 % MT SOLN
15.0000 mL | Freq: Once | OROMUCOSAL | Status: DC
  Filled 2021-04-05: qty 15

## 2021-04-05 MED ORDER — CHLORHEXIDINE GLUCONATE CLOTH 2 % EX PADS
6.0000 | MEDICATED_PAD | Freq: Every day | CUTANEOUS | Status: DC
  Administered 2021-04-06: 6 via TOPICAL

## 2021-04-05 MED ORDER — KETOROLAC TROMETHAMINE 15 MG/ML IJ SOLN
15.0000 mg | Freq: Once | INTRAMUSCULAR | Status: AC
  Administered 2021-04-05: 15 mg via INTRAVENOUS

## 2021-04-05 MED ORDER — METOCLOPRAMIDE HCL 5 MG/ML IJ SOLN
10.0000 mg | Freq: Four times a day (QID) | INTRAMUSCULAR | Status: AC
  Administered 2021-04-05 – 2021-04-06 (×3): 10 mg via INTRAVENOUS
  Filled 2021-04-05 (×4): qty 2

## 2021-04-05 MED ORDER — DEXAMETHASONE SODIUM PHOSPHATE 10 MG/ML IJ SOLN
INTRAMUSCULAR | Status: DC | PRN
  Administered 2021-04-05: 6 mg via INTRAVENOUS

## 2021-04-05 MED ORDER — ROSUVASTATIN CALCIUM 5 MG PO TABS
5.0000 mg | ORAL_TABLET | Freq: Every day | ORAL | Status: DC
  Administered 2021-04-06 – 2021-04-13 (×7): 5 mg via ORAL
  Filled 2021-04-05 (×7): qty 1

## 2021-04-05 MED ORDER — 0.9 % SODIUM CHLORIDE (POUR BTL) OPTIME
TOPICAL | Status: DC | PRN
  Administered 2021-04-05: 500 mL
  Administered 2021-04-05 (×2): 1000 mL

## 2021-04-05 MED ORDER — ORAL CARE MOUTH RINSE: 15.0000 mL | Freq: Once | OROMUCOSAL | Status: AC

## 2021-04-05 MED ORDER — HEMOSTATIC AGENTS (NO CHARGE) OPTIME
TOPICAL | Status: DC | PRN
  Administered 2021-04-05 (×3): 1 via TOPICAL

## 2021-04-05 MED ORDER — KETOROLAC TROMETHAMINE 15 MG/ML IJ SOLN
INTRAMUSCULAR | Status: AC
  Filled 2021-04-05: qty 1

## 2021-04-05 MED ORDER — DEXAMETHASONE SODIUM PHOSPHATE 10 MG/ML IJ SOLN
INTRAMUSCULAR | Status: AC
  Filled 2021-04-05: qty 1

## 2021-04-05 MED ORDER — CEFAZOLIN SODIUM-DEXTROSE 2-4 GM/100ML-% IV SOLN
2.0000 g | INTRAVENOUS | Status: DC
  Filled 2021-04-05: qty 100

## 2021-04-05 MED ORDER — INDOCYANINE GREEN 25 MG IV SOLR
INTRAVENOUS | Status: DC | PRN
  Administered 2021-04-05: 25 mg via INTRAVENOUS

## 2021-04-05 MED ORDER — CEFAZOLIN SODIUM-DEXTROSE 2-4 GM/100ML-% IV SOLN
2.0000 g | Freq: Three times a day (TID) | INTRAVENOUS | Status: AC
  Administered 2021-04-05 – 2021-04-06 (×2): 2 g via INTRAVENOUS
  Filled 2021-04-05 (×2): qty 100

## 2021-04-05 MED ORDER — ROCURONIUM BROMIDE 10 MG/ML (PF) SYRINGE
PREFILLED_SYRINGE | INTRAVENOUS | Status: AC
  Filled 2021-04-05: qty 20

## 2021-04-05 MED ORDER — ACETAMINOPHEN 160 MG/5ML PO SOLN: 1000.0000 mg | Freq: Four times a day (QID) | ORAL | Status: DC

## 2021-04-05 MED ORDER — NALOXONE HCL 0.4 MG/ML IJ SOLN
0.4000 mg | INTRAMUSCULAR | Status: DC | PRN
  Filled 2021-04-05: qty 1

## 2021-04-05 MED ORDER — FENTANYL CITRATE (PF) 250 MCG/5ML IJ SOLN
INTRAMUSCULAR | Status: DC | PRN
  Administered 2021-04-05: 25 ug via INTRAVENOUS
  Administered 2021-04-05 (×2): 50 ug via INTRAVENOUS
  Administered 2021-04-05: 100 ug via INTRAVENOUS

## 2021-04-05 MED ORDER — LIDOCAINE 2% (20 MG/ML) 5 ML SYRINGE
INTRAMUSCULAR | Status: AC
  Filled 2021-04-05: qty 5

## 2021-04-05 MED ORDER — MIDAZOLAM HCL 2 MG/2ML IJ SOLN
INTRAMUSCULAR | Status: DC | PRN
Start: 2021-04-05 — End: 2021-04-05
  Administered 2021-04-05: 1 mg via INTRAVENOUS

## 2021-04-05 MED ORDER — LACTATED RINGERS IV SOLN
INTRAVENOUS | Status: DC | PRN
Start: 2021-04-05 — End: 2021-04-05

## 2021-04-05 MED ORDER — BISACODYL 5 MG PO TBEC
10.0000 mg | DELAYED_RELEASE_TABLET | Freq: Every day | ORAL | Status: DC
  Administered 2021-04-06 – 2021-04-13 (×7): 10 mg via ORAL
  Filled 2021-04-05 (×7): qty 2

## 2021-04-05 MED ORDER — PHENYLEPHRINE 40 MCG/ML (10ML) SYRINGE FOR IV PUSH (FOR BLOOD PRESSURE SUPPORT)
PREFILLED_SYRINGE | INTRAVENOUS | Status: AC
  Filled 2021-04-05: qty 20

## 2021-04-05 MED ORDER — EPHEDRINE 5 MG/ML INJ
INTRAVENOUS | Status: AC
  Filled 2021-04-05: qty 10

## 2021-04-05 MED ORDER — MIDAZOLAM HCL 2 MG/2ML IJ SOLN
INTRAMUSCULAR | Status: AC
  Filled 2021-04-05: qty 2

## 2021-04-05 MED ORDER — KETOROLAC TROMETHAMINE 30 MG/ML IJ SOLN
30.0000 mg | Freq: Four times a day (QID) | INTRAMUSCULAR | Status: DC | PRN
  Administered 2021-04-06 – 2021-04-10 (×3): 30 mg via INTRAVENOUS
  Filled 2021-04-05 (×3): qty 1

## 2021-04-05 MED ORDER — ADULT MULTIVITAMIN W/MINERALS CH
1.0000 | ORAL_TABLET | Freq: Every day | ORAL | Status: DC
  Administered 2021-04-06 – 2021-04-13 (×7): 1 via ORAL
  Filled 2021-04-05 (×7): qty 1

## 2021-04-05 SURGICAL SUPPLY — 128 items
ADH SKN CLS APL DERMABOND .7 (GAUZE/BANDAGES/DRESSINGS) ×2
APPLIER CLIP ROT 10 11.4 M/L (STAPLE)
APR CLP MED LRG 11.4X10 (STAPLE)
BAG SPEC RTRVL C125 8X14 (MISCELLANEOUS) ×4
BLADE CLIPPER SURG (BLADE) ×3 IMPLANT
BLADE SURG SZ11 CARB STEEL (BLADE) ×3 IMPLANT
BNDG COHESIVE 6X5 TAN STRL LF (GAUZE/BANDAGES/DRESSINGS) ×3 IMPLANT
CANISTER SUCT 3000ML PPV (MISCELLANEOUS) ×6 IMPLANT
CANNULA REDUC XI 12-8 STAPL (CANNULA) ×6
CANNULA REDUCER 12-8 DVNC XI (CANNULA) ×4 IMPLANT
CATH THORACIC 28FR (CATHETERS) IMPLANT
CATH THORACIC 28FR RT ANG (CATHETERS) IMPLANT
CATH THORACIC 36FR (CATHETERS) IMPLANT
CATH THORACIC 36FR RT ANG (CATHETERS) IMPLANT
CLIP APPLIE ROT 10 11.4 M/L (STAPLE) IMPLANT
CLIP VESOCCLUDE MED 6/CT (CLIP) IMPLANT
CNTNR URN SCR LID CUP LEK RST (MISCELLANEOUS) ×10 IMPLANT
CONN ST 1/4X3/8  BEN (MISCELLANEOUS) ×3
CONN ST 1/4X3/8 BEN (MISCELLANEOUS) IMPLANT
CONN Y 3/8X3/8X3/8  BEN (MISCELLANEOUS)
CONN Y 3/8X3/8X3/8 BEN (MISCELLANEOUS) IMPLANT
CONT SPEC 4OZ STRL OR WHT (MISCELLANEOUS) ×45
DEFOGGER SCOPE WARMER CLEARIFY (MISCELLANEOUS) ×3 IMPLANT
DERMABOND ADVANCED (GAUZE/BANDAGES/DRESSINGS) ×1
DERMABOND ADVANCED .7 DNX12 (GAUZE/BANDAGES/DRESSINGS) ×2 IMPLANT
DRAIN CHANNEL 28F RND 3/8 FF (WOUND CARE) ×1 IMPLANT
DRAIN CHANNEL 32F RND 10.7 FF (WOUND CARE) IMPLANT
DRAPE ARM DVNC X/XI (DISPOSABLE) ×8 IMPLANT
DRAPE COLUMN DVNC XI (DISPOSABLE) ×2 IMPLANT
DRAPE CV SPLIT W-CLR ANES SCRN (DRAPES) ×3 IMPLANT
DRAPE DA VINCI XI ARM (DISPOSABLE) ×12
DRAPE DA VINCI XI COLUMN (DISPOSABLE) ×3
DRAPE HALF SHEET 40X57 (DRAPES) ×3 IMPLANT
DRAPE INCISE IOBAN 66X45 STRL (DRAPES) IMPLANT
DRAPE ORTHO SPLIT 77X108 STRL (DRAPES) ×3
DRAPE SURG ORHT 6 SPLT 77X108 (DRAPES) ×2 IMPLANT
DRSG AQUACEL AG ADV 3.5X 6 (GAUZE/BANDAGES/DRESSINGS) ×1 IMPLANT
ELECT BLADE 4.0 EZ CLEAN MEGAD (MISCELLANEOUS) ×3
ELECT BLADE 6.5 EXT (BLADE) IMPLANT
ELECT REM PT RETURN 9FT ADLT (ELECTROSURGICAL) ×3
ELECTRODE BLDE 4.0 EZ CLN MEGD (MISCELLANEOUS) IMPLANT
ELECTRODE REM PT RTRN 9FT ADLT (ELECTROSURGICAL) ×2 IMPLANT
GAUZE 4X4 16PLY ~~LOC~~+RFID DBL (SPONGE) ×1 IMPLANT
GAUZE KITTNER 4X5 RF (MISCELLANEOUS) ×9 IMPLANT
GAUZE SPONGE 4X4 12PLY STRL (GAUZE/BANDAGES/DRESSINGS) ×3 IMPLANT
GLOVE SURG MICRO LTX SZ6 (GLOVE) ×1 IMPLANT
GLOVE SURG UNDER POLY LF SZ6.5 (GLOVE) ×1 IMPLANT
GLOVE TRIUMPH SURG SIZE 7.5 (KITS) ×6 IMPLANT
GOWN STRL REUS W/ TWL LRG LVL3 (GOWN DISPOSABLE) ×4 IMPLANT
GOWN STRL REUS W/ TWL XL LVL3 (GOWN DISPOSABLE) ×6 IMPLANT
GOWN STRL REUS W/TWL 2XL LVL3 (GOWN DISPOSABLE) ×6 IMPLANT
GOWN STRL REUS W/TWL LRG LVL3 (GOWN DISPOSABLE) ×6
GOWN STRL REUS W/TWL XL LVL3 (GOWN DISPOSABLE) ×9
HANDLE STAPLE  ENDO EGIA 4 STD (STAPLE) ×3
HANDLE STAPLE ENDO EGIA 4 STD (STAPLE) IMPLANT
HEMOSTAT SURGICEL 2X14 (HEMOSTASIS) ×12 IMPLANT
IRRIGATION STRYKERFLOW (MISCELLANEOUS) ×2 IMPLANT
IRRIGATOR STRYKERFLOW (MISCELLANEOUS) ×3
KIT BASIN OR (CUSTOM PROCEDURE TRAY) ×3 IMPLANT
KIT SUCTION CATH 14FR (SUCTIONS) IMPLANT
KIT TURNOVER KIT B (KITS) ×3 IMPLANT
LOOP VESSEL SUPERMAXI WHITE (MISCELLANEOUS) IMPLANT
NDL HYPO 25GX1X1/2 BEV (NEEDLE) ×2 IMPLANT
NDL SPNL 22GX3.5 QUINCKE BK (NEEDLE) IMPLANT
NEEDLE HYPO 25GX1X1/2 BEV (NEEDLE) ×3 IMPLANT
NEEDLE SPNL 22GX3.5 QUINCKE BK (NEEDLE) IMPLANT
NS IRRIG 1000ML POUR BTL (IV SOLUTION) ×5 IMPLANT
PACK CHEST (CUSTOM PROCEDURE TRAY) ×3 IMPLANT
PAD ARMBOARD 7.5X6 YLW CONV (MISCELLANEOUS) ×6 IMPLANT
PORT ACCESS TROCAR AIRSEAL 12 (TROCAR) ×2 IMPLANT
PORT ACCESS TROCAR AIRSEAL 5M (TROCAR) ×1
RELOAD EGIA 45 MED/THCK PURPLE (STAPLE) ×4 IMPLANT
RELOAD STAPLE 45 2.5 WHT DVNC (STAPLE) IMPLANT
RELOAD STAPLE 45 3.5 BLU DVNC (STAPLE) IMPLANT
RELOAD STAPLE 45 4.3 GRN DVNC (STAPLE) IMPLANT
RELOAD STAPLER 2.5X45 WHT DVNC (STAPLE) ×4 IMPLANT
RELOAD STAPLER 3.5X45 BLU DVNC (STAPLE) ×18 IMPLANT
RELOAD STAPLER 4.3X45 GRN DVNC (STAPLE) ×28 IMPLANT
SCISSORS LAP 5X35 DISP (ENDOMECHANICALS) IMPLANT
SEAL CANN UNIV 5-8 DVNC XI (MISCELLANEOUS) ×4 IMPLANT
SEAL XI 5MM-8MM UNIVERSAL (MISCELLANEOUS) ×6
SEALANT PROGEL (MISCELLANEOUS) ×1 IMPLANT
SEALANT SURG COSEAL 4ML (VASCULAR PRODUCTS) IMPLANT
SEALANT SURG COSEAL 8ML (VASCULAR PRODUCTS) IMPLANT
SET TRI-LUMEN FLTR TB AIRSEAL (TUBING) ×3 IMPLANT
SHEARS HARMONIC HDI 20CM (ELECTROSURGICAL) IMPLANT
SOLUTION ELECTROLUBE (MISCELLANEOUS) ×3 IMPLANT
SPONGE INTESTINAL PEANUT (DISPOSABLE) IMPLANT
SPONGE T-LAP 18X18 ~~LOC~~+RFID (SPONGE) ×2 IMPLANT
SPONGE TONSIL TAPE 1 RFD (DISPOSABLE) ×1 IMPLANT
STAPLER 45 SUREFORM CVD (STAPLE) ×6
STAPLER 45 SUREFORM CVD DVNC (STAPLE) IMPLANT
STAPLER CANNULA SEAL DVNC XI (STAPLE) ×4 IMPLANT
STAPLER CANNULA SEAL XI (STAPLE) ×6
STAPLER RELOAD 2.5X45 WHITE (STAPLE) ×6
STAPLER RELOAD 2.5X45 WHT DVNC (STAPLE) ×4
STAPLER RELOAD 3.5X45 BLU DVNC (STAPLE) ×18
STAPLER RELOAD 3.5X45 BLUE (STAPLE) ×27
STAPLER RELOAD 4.3X45 GREEN (STAPLE) ×42
STAPLER RELOAD 4.3X45 GRN DVNC (STAPLE) ×28
SUT PDS AB 3-0 SH 27 (SUTURE) ×5 IMPLANT
SUT PROLENE 4 0 RB 1 (SUTURE)
SUT PROLENE 4-0 RB1 .5 CRCL 36 (SUTURE) IMPLANT
SUT SILK  1 MH (SUTURE) ×6
SUT SILK 1 MH (SUTURE) ×4 IMPLANT
SUT SILK 1 TIES 10X30 (SUTURE) ×3 IMPLANT
SUT SILK 2 0 SH (SUTURE) ×4 IMPLANT
SUT SILK 2 0SH CR/8 30 (SUTURE) IMPLANT
SUT SILK 3 0 SH 30 (SUTURE) IMPLANT
SUT SILK 3 0SH CR/8 30 (SUTURE) IMPLANT
SUT VIC AB 1 CTX 36 (SUTURE) ×6
SUT VIC AB 1 CTX36XBRD ANBCTR (SUTURE) IMPLANT
SUT VIC AB 2-0 CTX 36 (SUTURE) ×2 IMPLANT
SUT VIC AB 3-0 MH 27 (SUTURE) IMPLANT
SUT VIC AB 3-0 X1 27 (SUTURE) ×6 IMPLANT
SUT VICRYL 0 TIES 12 18 (SUTURE) ×3 IMPLANT
SUT VICRYL 0 UR6 27IN ABS (SUTURE) ×6 IMPLANT
SUT VICRYL 2 TP 1 (SUTURE) ×4 IMPLANT
SYR 20ML ECCENTRIC (SYRINGE) ×3 IMPLANT
SYSTEM RETRIEVAL ANCHOR 8 (MISCELLANEOUS) ×2 IMPLANT
SYSTEM SAHARA CHEST DRAIN ATS (WOUND CARE) ×3 IMPLANT
TAPE CLOTH 4X10 WHT NS (GAUZE/BANDAGES/DRESSINGS) ×3 IMPLANT
TAPE CLOTH SURG 4X10 WHT LF (GAUZE/BANDAGES/DRESSINGS) ×1 IMPLANT
TIP APPLICATOR SPRAY EXTEND 16 (VASCULAR PRODUCTS) ×1 IMPLANT
TOWEL GREEN STERILE (TOWEL DISPOSABLE) ×3 IMPLANT
TRAY FOLEY MTR SLVR 16FR STAT (SET/KITS/TRAYS/PACK) ×3 IMPLANT
TROCAR BLADELESS 15MM (ENDOMECHANICALS) IMPLANT
WATER STERILE IRR 1000ML POUR (IV SOLUTION) ×3 IMPLANT

## 2021-04-05 NOTE — Transfer of Care (Signed)
Immediate Anesthesia Transfer of Care Note  Patient: Sara Coffey  Procedure(s) Performed: XI ROBOTIC ASSISTED THORACOSCOPY-LEFT LOWER LOBE SUPERIOR SEGMENTECTOMY (Left: Chest) LYMPH NODE DISSECTION (Left: Chest) THORACOTOMY MAJOR INTERCOSTAL NERVE BLOCK  Patient Location: PACU  Anesthesia Type:General  Level of Consciousness: awake  Airway & Oxygen Therapy: Patient connected to face mask oxygen  Post-op Assessment: Report given to RN, Post -op Vital signs reviewed and stable and Patient moving all extremities X 4  Post vital signs: Reviewed and stable  Last Vitals:  Vitals Value Taken Time  BP 118/58 04/05/21 1628  Temp    Pulse 92 04/05/21 1628  Resp 16 04/05/21 1628  SpO2 100 % 04/05/21 1628  Vitals shown include unvalidated device data.  Last Pain:  Vitals:   04/05/21 0822  TempSrc:   PainSc: 0-No pain         Complications: No notable events documented.

## 2021-04-05 NOTE — Anesthesia Procedure Notes (Signed)
Arterial Line Insertion Start/End8/19/2022 9:10 AM Performed by: Carolan Clines, CRNA, CRNA  Patient location: Pre-op. Preanesthetic checklist: patient identified, IV checked, site marked, risks and benefits discussed, surgical consent, monitors and equipment checked, pre-op evaluation, timeout performed and anesthesia consent Lidocaine 1% used for infiltration Right, radial was placed Catheter size: 20 G Hand hygiene performed  and maximum sterile barriers used   Attempts: 2 Procedure performed without using ultrasound guided technique. Following insertion, dressing applied and Biopatch. Post procedure assessment: normal and unchanged  Patient tolerated the procedure well with no immediate complications.

## 2021-04-05 NOTE — Brief Op Note (Addendum)
04/05/2021  4:06 PM  PATIENT:  Sara Coffey  70 y.o. female  PRE-OPERATIVE DIAGNOSIS:  LEFT LOWER LOBE NODULE  POST-OPERATIVE DIAGNOSIS:  LEFT LOWER LOBE NODULE  PROCEDURE:  Procedure(s):  -XI ROBOTIC ASSISTED LEFT THORACOSCOPY/ MINI THORACOTOMY -LEFT LOWER LOBE SUPERIOR SEGMENTECTOMY -WEDGE RESECTION LEFT UPPER LOBE -LYMPH NODE DISSECTION -INTERCOSTAL NERVE BLOCK LEVELS 3-10  SURGEON:  Surgeon(s) and Role:    * [name], MD - Primary  PHYSICIAN ASSISTANT: Ellwood Handler PA-C   ASSISTANTS: none   ANESTHESIA:   general  EBL:  Per Anesthesia Record  BLOOD ADMINISTERED:none  DRAINS:  4 Blake Drain Left Chest    LOCAL MEDICATIONS USED:  BUPIVICAINE   SPECIMEN:  Source of Specimen:  Wedge Resection LLL , Superior Segment LLL, Lymph Nodes  DISPOSITION OF SPECIMEN:  PATHOLOGY  COUNTS:  YES  TOURNIQUET:  * No tourniquets in log *  DICTATION: .Dragon Dictation  PLAN OF CARE: Admit to inpatient   PATIENT DISPOSITION:  PACU - hemodynamically stable.   Delay start of Pharmacological VTE agent (>24hrs) due to surgical blood loss or risk of bleeding: no

## 2021-04-05 NOTE — Anesthesia Procedure Notes (Signed)
Procedure Name: Intubation Date/Time: 04/05/2021 11:44 AM Performed by: Bryson Corona, CRNA Pre-anesthesia Checklist: Patient identified, Emergency Drugs available, Suction available and Patient being monitored Patient Re-evaluated:Patient Re-evaluated prior to induction Oxygen Delivery Method: Circle System Utilized Preoxygenation: Pre-oxygenation with 100% oxygen Induction Type: IV induction Ventilation: Mask ventilation without difficulty and Oral airway inserted - appropriate to patient size Laryngoscope Size: Mac and 3 Grade View: Grade I Endobronchial tube: Double lumen EBT, EBT position confirmed by fiberoptic bronchoscope and Left and 37 Fr Number of attempts: 1 Airway Equipment and Method: Stylet Placement Confirmation: ETT inserted through vocal cords under direct vision, positive ETCO2 and breath sounds checked- equal and bilateral Tube secured with: Tape Dental Injury: Teeth and Oropharynx as per pre-operative assessment

## 2021-04-05 NOTE — Hospital Course (Addendum)
History of Present Illness:  Sara Coffey is a 70 year old woman with a history of tobacco abuse, hyperlipidemia, osteoporosis, syncope with transient global amnesia and possible transient ischemic attack, and colon cancer.  She had surgery for colon cancer a year ago.  She did not require adjuvant therapy.  In September 2021 she was noted to have a groundglass opacity in the superior segment of the left lower lobe.  It was about 7 mm in diameter.  Recently she had a follow-up CT that showed the nodule was about 8 mm in diameter but now had a solid component.  There was no adenopathy.  She smoked about a pack a day for 40 years.  She quit 2 weeks ago but has been vaping instead.  She denies any chest pain, pressure, or tightness.  She denies shortness of breath, wheezing, and cough.  She is active and push mows her own yard without any issues.  She has not had any change in appetite or weight loss.  IShe was evaluated by Dr. Roxan Hockey who felt the patient would benefit from surgical resection for definitive diagnosis.  The risks and benefits of the procedure were explained to the patient and he was agreeable to proceed.  Hospital Course:  Sara Coffey presented to [facility] on 04/05/2021.  She was taken to the operating room and underwent Robotic Assisted Video Thoracoscopy, Mini Thoracotomy with superior segmentectomy of LLL, Lymph Node Dissection, and Intercostal Nerve Block.  She tolerated the procedure without difficulty and was taken to the PACU in stable condition. We continued her chest tube to water seal and her output remained high. She was weaned off supplemental oxygen and tolerating room air with good saturation.  CXR showed increase in left pleural effusion/atelectasis.  Her hemoglobin level decreased to 7.8 mg.  It was felt she likely suffered bleeding in her chest.  Her Lovenox was discontinued.  Her hemoglobin level decreased further to 7.5  CXR showed slight increase in left  pleural effusion.  Her chest tube output remained low.  It was felt she would require Re-exploration in the operating room for evacuation of hematoma.  This was completed on 04/10/2021 when she underwent Video Assisted Thoracoscopy with drainage of hemothorax and wedge resection LLL.  The patient tolerated the procedure without difficulty.  Her hemoglobin level decreased further to 6.5.  She was transfused 2 units of packed cell with repeat H/H of 8.4.  Her chest xray showed improvement of hemothorax.  There was however a new trace apical pneumothorax.  Her chest tube was placed to water seal on 04/11/2021.  It was free from air leak and her chest tube was removed on 04/12/2021.  Follow up CXR showed minimal left apical pneumothorax.  Her hemoglobin level has remained stable with most recent level being 8.0.  It has been difficult to wean the patient off oxygen but sats are acceptable on RA currently.  Her surgical incisions are healing without evidence of infection.  She is ambulating and is felt medically stable for discharge home today.

## 2021-04-05 NOTE — Anesthesia Preprocedure Evaluation (Signed)
Anesthesia Evaluation  Patient identified by MRN, date of birth, ID band Patient awake    Reviewed: Allergy & Precautions, NPO status , Patient's Chart, lab work & pertinent test results  Airway Mallampati: II  TM Distance: >3 FB Neck ROM: Full    Dental  (+) Dental Advisory Given   Pulmonary Current Smoker and Patient abstained from smoking.,  Lung nodule   breath sounds clear to auscultation       Cardiovascular negative cardio ROS   Rhythm:Regular Rate:Normal     Neuro/Psych TIA   GI/Hepatic negative GI ROS, Neg liver ROS,   Endo/Other  negative endocrine ROS  Renal/GU negative Renal ROS     Musculoskeletal   Abdominal   Peds  Hematology negative hematology ROS (+)   Anesthesia Other Findings   Reproductive/Obstetrics                             Lab Results  Component Value Date   WBC 5.0 04/03/2021   HGB 13.7 04/03/2021   HCT 39.8 04/03/2021   MCV 92.1 04/03/2021   PLT 251 04/03/2021   Lab Results  Component Value Date   CREATININE 0.82 04/03/2021   BUN 8 04/03/2021   NA 138 04/03/2021   K 3.9 04/03/2021   CL 106 04/03/2021   CO2 24 04/03/2021    Anesthesia Physical Anesthesia Plan  ASA: 3  Anesthesia Plan: General   Post-op Pain Management:    Induction: Intravenous  PONV Risk Score and Plan: 2 and Dexamethasone, Ondansetron and Treatment may vary due to age or medical condition  Airway Management Planned: Double Lumen EBT  Additional Equipment: Arterial line  Intra-op Plan:   Post-operative Plan: Extubation in OR and Possible Post-op intubation/ventilation  Informed Consent: I have reviewed the patients History and Physical, chart, labs and discussed the procedure including the risks, benefits and alternatives for the proposed anesthesia with the patient or authorized representative who has indicated his/her understanding and acceptance.     Dental  advisory given  Plan Discussed with: CRNA  Anesthesia Plan Comments:         Anesthesia Quick Evaluation

## 2021-04-05 NOTE — Interval H&P Note (Signed)
History and Physical Interval Note:  04/05/2021 9:20 AM  Sara Coffey  has presented today for surgery, with the diagnosis of LLL NODULE.  The various methods of treatment have been discussed with the patient and family. After consideration of risks, benefits and other options for treatment, the patient has consented to  Procedure(s): XI ROBOTIC ASSISTED THORACOSCOPY-LEFT LOWER LOBE SUPERIOR SEGMENTECTOMY (Left) as a surgical intervention.  The patient's history has been reviewed, patient examined, no change in status, stable for surgery.  I have reviewed the patient's chart and labs.  Questions were answered to the patient's satisfaction.     [name]

## 2021-04-06 ENCOUNTER — Inpatient Hospital Stay (HOSPITAL_COMMUNITY): Payer: Medicare HMO

## 2021-04-06 ENCOUNTER — Encounter (HOSPITAL_COMMUNITY): Payer: Self-pay | Admitting: Thoracic Surgery (Cardiothoracic Vascular Surgery)

## 2021-04-06 LAB — CBC
HCT: 30 % — ABNORMAL LOW (ref 36.0–46.0)
Hemoglobin: 10.2 g/dL — ABNORMAL LOW (ref 12.0–15.0)
MCH: 31.7 pg (ref 26.0–34.0)
MCHC: 34 g/dL (ref 30.0–36.0)
MCV: 93.2 fL (ref 80.0–100.0)
Platelets: 203 10*3/uL (ref 150–400)
RBC: 3.22 MIL/uL — ABNORMAL LOW (ref 3.87–5.11)
RDW: 12.5 % (ref 11.5–15.5)
WBC: 7.4 10*3/uL (ref 4.0–10.5)
nRBC: 0 % (ref 0.0–0.2)

## 2021-04-06 LAB — BLOOD GAS, ARTERIAL
Acid-Base Excess: 0.3 mmol/L (ref 0.0–2.0)
Bicarbonate: 24.4 mmol/L (ref 20.0–28.0)
Drawn by: 31394
FIO2: 21
O2 Saturation: 96.2 %
Patient temperature: 36.6
pCO2 arterial: 38.7 mmHg (ref 32.0–48.0)
pH, Arterial: 7.414 (ref 7.350–7.450)
pO2, Arterial: 71.9 mmHg — ABNORMAL LOW (ref 83.0–108.0)

## 2021-04-06 MED ORDER — ENOXAPARIN SODIUM 40 MG/0.4ML IJ SOSY
40.0000 mg | PREFILLED_SYRINGE | Freq: Every day | INTRAMUSCULAR | Status: DC
  Administered 2021-04-06 – 2021-04-07 (×2): 40 mg via SUBCUTANEOUS
  Filled 2021-04-06 (×2): qty 0.4

## 2021-04-06 NOTE — [facility] Notes (Signed)
1 Day Post-Op Procedure(s) (LRB): XI ROBOTIC ASSISTED THORACOSCOPY-LEFT LOWER LOBE SUPERIOR SEGMENTECTOMY (Left) LYMPH NODE DISSECTION (Left) THORACOTOMY MAJOR INTERCOSTAL NERVE BLOCK Subjective: C/o incisional pain, PCA causing nausea  Objective: Vital signs in last 24 hours: Temp:  [97 F (36.1 C)-98.2 F (36.8 C)] 98.2 F (36.8 C) (08/20 0721) Pulse Rate:  [75-98] 98 (08/20 0800) Cardiac Rhythm: Normal sinus rhythm (08/20 0700) Resp:  [12-22] 20 (08/20 0800) BP: (86-133)/(52-82) 104/72 (08/20 0800) SpO2:  [88 %-100 %] 94 % (08/20 0800) Arterial Line BP: (114-127)/(62-66) 114/66 (08/19 1745)  Hemodynamic parameters for last 24 hours:    Intake/Output from previous day: 08/19 0701 - 08/20 0700 In: 3546 [P.O.:480; I.V.:2766; IV Piggyback:200] Out: 1400 [Urine:890; Blood:100; Chest Tube:410] Intake/Output this shift: Total I/O In: 120 [P.O.:120] Out: -   General appearance: alert, cooperative, and no distress Neurologic: intact Heart: regular rate and rhythm Lungs: diminished breath sounds left base No air leak, serosanguinous drainage from CT  Lab Results: Recent Labs    04/06/21 0534  WBC 7.4  HGB 10.2*  HCT 30.0*  PLT 203   BMET: No results for input(s): NA, K, CL, CO2, GLUCOSE, BUN, CREATININE, CALCIUM in the last 72 hours.  PT/INR: No results for input(s): LABPROT, INR in the last 72 hours. ABG    Component Value Date/Time   PHART 7.414 04/06/2021 0450   HCO3 24.4 04/06/2021 0450   TCO2 23 04/17/2019 1421   ACIDBASEDEF 0.3 04/03/2021 0950   O2SAT 96.2 04/06/2021 0450   CBG (last 3)  No results for input(s): GLUCAP in the last 72 hours.  Assessment/Plan: S/P Procedure(s) (LRB): XI ROBOTIC ASSISTED THORACOSCOPY-LEFT LOWER LOBE SUPERIOR SEGMENTECTOMY (Left) LYMPH NODE DISSECTION (Left) THORACOTOMY MAJOR INTERCOSTAL NERVE BLOCK POD # 1 Overall doing well Nausea with PCA- will dc, continue acetaminophen, PRN ketorolac and PRN oxycodone No air  leak- CT to water seal Ambulate SCD + enoxaparin for DVT prophylaxis    LOS: 1 day    [name] 04/06/2021

## 2021-04-07 ENCOUNTER — Inpatient Hospital Stay (HOSPITAL_COMMUNITY): Payer: Medicare HMO

## 2021-04-07 LAB — CBC
HCT: 27.1 % — ABNORMAL LOW (ref 36.0–46.0)
Hemoglobin: 9.3 g/dL — ABNORMAL LOW (ref 12.0–15.0)
MCH: 32 pg (ref 26.0–34.0)
MCHC: 34.3 g/dL (ref 30.0–36.0)
MCV: 93.1 fL (ref 80.0–100.0)
Platelets: 184 10*3/uL (ref 150–400)
RBC: 2.91 MIL/uL — ABNORMAL LOW (ref 3.87–5.11)
RDW: 12.5 % (ref 11.5–15.5)
WBC: 8.7 10*3/uL (ref 4.0–10.5)
nRBC: 0 % (ref 0.0–0.2)

## 2021-04-07 LAB — COMPREHENSIVE METABOLIC PANEL
ALT: 16 U/L (ref 0–44)
AST: 27 U/L (ref 15–41)
Albumin: 2.8 g/dL — ABNORMAL LOW (ref 3.5–5.0)
Alkaline Phosphatase: 36 U/L — ABNORMAL LOW (ref 38–126)
Anion gap: 6 (ref 5–15)
BUN: 11 mg/dL (ref 8–23)
CO2: 25 mmol/L (ref 22–32)
Calcium: 8.6 mg/dL — ABNORMAL LOW (ref 8.9–10.3)
Chloride: 101 mmol/L (ref 98–111)
Creatinine, Ser: 0.87 mg/dL (ref 0.44–1.00)
GFR, Estimated: >60 mL/min (ref >60)
Glucose, Bld: 134 mg/dL — ABNORMAL HIGH (ref 70–99)
Potassium: 3.7 mmol/L (ref 3.5–5.1)
Sodium: 132 mmol/L — ABNORMAL LOW (ref 135–145)
Total Bilirubin: 0.5 mg/dL (ref 0.3–1.2)
Total Protein: 5.1 g/dL — ABNORMAL LOW (ref 6.5–8.1)

## 2021-04-07 MED ORDER — POTASSIUM CHLORIDE ER 10 MEQ PO TBCR
40.0000 meq | EXTENDED_RELEASE_TABLET | Freq: Once | ORAL | Status: AC
  Administered 2021-04-07: 40 meq via ORAL
  Filled 2021-04-07 (×2): qty 4

## 2021-04-07 NOTE — [facility] Notes (Signed)
2 Days Post-Op Procedure(s) (LRB): XI ROBOTIC ASSISTED THORACOSCOPY-LEFT LOWER LOBE SUPERIOR SEGMENTECTOMY (Left) LYMPH NODE DISSECTION (Left) THORACOTOMY MAJOR INTERCOSTAL NERVE BLOCK Subjective: Pain with coughing  Objective: Vital signs in last 24 hours: Temp:  [98.1 F (36.7 C)-99.3 F (37.4 C)] 98.5 F (36.9 C) (08/21 0724) Pulse Rate:  [87-101] 101 (08/21 0724) Cardiac Rhythm: Normal sinus rhythm (08/21 0715) Resp:  [15-20] 20 (08/21 0724) BP: (94-110)/(53-66) 110/66 (08/21 0724) SpO2:  [90 %-96 %] 93 % (08/21 0724)  Hemodynamic parameters for last 24 hours:    Intake/Output from previous day: 08/20 0701 - 08/21 0700 In: 2532.3 [P.O.:600; I.V.:1932.3] Out: 2350 [Urine:1600; Chest Tube:750] Intake/Output this shift: Total I/O In: 240 [P.O.:240] Out: 400 [Urine:400]  General appearance: alert, cooperative, and no distress Neurologic: intact Heart: tachy, regular Lungs: diminished breath sounds left base Serosanguinous drainage form CT  Lab Results: Recent Labs    04/06/21 0534 04/07/21 0053  WBC 7.4 8.7  HGB 10.2* 9.3*  HCT 30.0* 27.1*  PLT 203 184   BMET:  Recent Labs    04/07/21 0053  NA 132*  K 3.7  CL 101  CO2 25  GLUCOSE 134*  BUN 11  CREATININE 0.87  CALCIUM 8.6*    PT/INR: No results for input(s): LABPROT, INR in the last 72 hours. ABG    Component Value Date/Time   PHART 7.414 04/06/2021 0450   HCO3 24.4 04/06/2021 0450   TCO2 23 04/17/2019 1421   ACIDBASEDEF 0.3 04/03/2021 0950   O2SAT 96.2 04/06/2021 0450   CBG (last 3)  No results for input(s): GLUCAP in the last 72 hours.  Assessment/Plan: S/P Procedure(s) (LRB): XI ROBOTIC ASSISTED THORACOSCOPY-LEFT LOWER LOBE SUPERIOR SEGMENTECTOMY (Left) LYMPH NODE DISSECTION (Left) THORACOTOMY MAJOR INTERCOSTAL NERVE BLOCK POD # 2 Overall looks good. Still with relatively high CT output-  blood tinged serous in nature Keep Ct on water seal Sat Ok on RA Mildly tachy this AM,  monitor Supplement K SCD+ enoxaparin for DVT prophylaxis Ambulate Anemia secondary to ABL- mild, follow  LOS: 2 days    [name] 04/07/2021

## 2021-04-07 NOTE — [facility] Notes (Signed)
PCA discontinued by Dr. Roxan Hockey, wasted 23 ml Hydromorphone PCA med with Anson Fret, RN

## 2021-04-08 ENCOUNTER — Encounter (HOSPITAL_COMMUNITY): Payer: Self-pay | Admitting: Thoracic Surgery (Cardiothoracic Vascular Surgery)

## 2021-04-08 ENCOUNTER — Inpatient Hospital Stay (HOSPITAL_COMMUNITY): Payer: Medicare HMO

## 2021-04-08 DIAGNOSIS — R911 Solitary pulmonary nodule: Secondary | ICD-10-CM

## 2021-04-08 LAB — CBC
HCT: 22.7 % — ABNORMAL LOW (ref 36.0–46.0)
Hemoglobin: 7.8 g/dL — ABNORMAL LOW (ref 12.0–15.0)
MCH: 32 pg (ref 26.0–34.0)
MCHC: 34.4 g/dL (ref 30.0–36.0)
MCV: 93 fL (ref 80.0–100.0)
Platelets: 183 10*3/uL (ref 150–400)
RBC: 2.44 MIL/uL — ABNORMAL LOW (ref 3.87–5.11)
RDW: 12.4 % (ref 11.5–15.5)
WBC: 11.7 10*3/uL — ABNORMAL HIGH (ref 4.0–10.5)
nRBC: 0 % (ref 0.0–0.2)

## 2021-04-08 LAB — BASIC METABOLIC PANEL
Anion gap: 5 (ref 5–15)
BUN: 11 mg/dL (ref 8–23)
CO2: 26 mmol/L (ref 22–32)
Calcium: 8.3 mg/dL — ABNORMAL LOW (ref 8.9–10.3)
Chloride: 100 mmol/L (ref 98–111)
Creatinine, Ser: 0.86 mg/dL (ref 0.44–1.00)
GFR, Estimated: >60 mL/min (ref >60)
Glucose, Bld: 123 mg/dL — ABNORMAL HIGH (ref 70–99)
Potassium: 4.4 mmol/L (ref 3.5–5.1)
Sodium: 131 mmol/L — ABNORMAL LOW (ref 135–145)

## 2021-04-08 MED ORDER — LACTULOSE 10 GM/15ML PO SOLN: 20.0000 g | Freq: Every day | ORAL | Status: DC | PRN

## 2021-04-08 NOTE — Anesthesia Postprocedure Evaluation (Signed)
Anesthesia Post Note  Patient: Sara Coffey  Procedure(s) Performed: XI ROBOTIC ASSISTED THORACOSCOPY-LEFT LOWER LOBE SUPERIOR SEGMENTECTOMY (Left: Chest) LYMPH NODE DISSECTION (Left: Chest) THORACOTOMY MAJOR INTERCOSTAL NERVE BLOCK     Patient location during evaluation: PACU Anesthesia Type: General Level of consciousness: awake and alert Pain management: pain level controlled Vital Signs Assessment: post-procedure vital signs reviewed and stable Respiratory status: spontaneous breathing, nonlabored ventilation, respiratory function stable and patient connected to nasal cannula oxygen Cardiovascular status: blood pressure returned to baseline and stable Postop Assessment: no apparent nausea or vomiting Anesthetic complications: no   No notable events documented.  Last Vitals:  Vitals:   04/08/21 1122 04/08/21 1537  BP: (!) 93/59 (!) 84/52  Pulse: 100 (!) 102  Resp: 18 20  Temp: 37.1 C 37.2 C  SpO2: 95% 91%    Last Pain:  Vitals:   04/08/21 1537  TempSrc: Oral  PainSc:                  [name]

## 2021-04-08 NOTE — [facility] Notes (Signed)
   04/08/21 1954  Assess: MEWS Score  Temp (!) 100.5 F (38.1 C)  BP (!) 102/56  Pulse Rate (!) 111  ECG Heart Rate (!) 112  Resp 20  SpO2 95 %  O2 Device Room Air  Assess: MEWS Score  MEWS Temp 1  MEWS Systolic 0  MEWS Pulse 2  MEWS RR 0  MEWS LOC 0  MEWS Score 3  MEWS Score Color Yellow  Assess: if the MEWS score is Yellow or Red  Were vital signs taken at a resting state? Yes  Focused Assessment No change from prior assessment  Early Detection of Sepsis Score *See Row Information* Low  MEWS guidelines implemented *See Row Information* Yes  Treat  Pain Scale 0-10  Pain Score 0  Take Vital Signs  Increase Vital Sign Frequency  Yellow: Q 2hr X 2 then Q 4hr X 2, if remains yellow, continue Q 4hrs  Escalate  MEWS: Escalate Yellow: discuss with charge nurse/RN and consider discussing with provider and RRT  Notify: Charge Nurse/RN  Name of Charge Nurse/RN Notified Almedia Balls, RN  Date Charge Nurse/RN Notified 04/08/21  Time Charge Nurse/RN Notified 0000  Document  Patient Outcome Other (Comment) (Temp responded to Tylenol previously given.)

## 2021-04-08 NOTE — Progress Notes (Addendum)
      [address]       [place] [address]             [phone]      3 Days Post-Op Procedure(s) (LRB): XI ROBOTIC ASSISTED THORACOSCOPY-LEFT LOWER LOBE SUPERIOR SEGMENTECTOMY (Left) LYMPH NODE DISSECTION (Left) THORACOTOMY MAJOR INTERCOSTAL NERVE BLOCK  Subjective:  No new complaints.  Sitting up in chair eating breakfast.  She has not experienced any further nausea, vomiting.  No BM yet, is passing gas  Objective: Vital signs in last 24 hours: Temp:  [98.4 F (36.9 C)-99.8 F (37.7 C)] 98.8 F (37.1 C) (08/22 0306) Pulse Rate:  [99-113] 99 (08/22 0306) Cardiac Rhythm: Sinus tachycardia (08/22 0700) Resp:  [18-20] 20 (08/22 0306) BP: (91-122)/(60-85) 97/60 (08/22 0306) SpO2:  [91 %-99 %] 99 % (08/22 0306)  Intake/Output from previous day: 08/21 0701 - 08/22 0700 In: 1492.1 [P.O.:960; I.V.:532.1] Out: 985 [Urine:725; Chest Tube:260]  General appearance: alert, cooperative, and no distress Heart: regular rate and rhythm Lungs: diminished breath sounds on left Abdomen: soft, non-tender; bowel sounds normal; no masses,  no organomegaly Extremities: extremities normal, atraumatic, no cyanosis or edema Wound: clean and dry  Lab Results: Recent Labs    04/07/21 0053 04/08/21 0102  WBC 8.7 11.7*  HGB 9.3* 7.8*  HCT 27.1* 22.7*  PLT 184 183   BMET:  Recent Labs    04/07/21 0053 04/08/21 0102  NA 132* 131*  K 3.7 4.4  CL 101 100  CO2 25 26  GLUCOSE 134* 123*  BUN 11 11  CREATININE 0.87 0.86  CALCIUM 8.6* 8.3*    PT/INR: No results for input(s): LABPROT, INR in the last 72 hours. ABG    Component Value Date/Time   PHART 7.414 04/06/2021 0450   HCO3 24.4 04/06/2021 0450   TCO2 23 04/17/2019 1421   ACIDBASEDEF 0.3 04/03/2021 0950   O2SAT 96.2 04/06/2021 0450   CBG (last 3)  No results for input(s): GLUCAP in the last 72 hours.  Assessment/Plan: S/P Procedure(s) (LRB): XI ROBOTIC ASSISTED THORACOSCOPY-LEFT LOWER LOBE SUPERIOR  SEGMENTECTOMY (Left) LYMPH NODE DISSECTION (Left) THORACOTOMY MAJOR INTERCOSTAL NERVE BLOCK  CV- NSR, BP in the 90s Pulm- CT with 260 cc output yesterday, no air leak present, CXR with worsening effusion/atelectasis on left, off oxygen, continue IS.. . Expected post operative blood loss anemia, Hgb down to 7.8, no obvious source of bleedings, monitor stool to ensure no blood loss Constipation- good BS, patient not uncomfortable, no abdominal distention, prn laxative Dispo- patient stable, drop in Hgb to 7.8 monitor, no acute signs of bleeding, CXR with increased pleural effusion/atelectasis monitor.. CT output remains serous with 260 cc output yesterday, leave to water seal, care per Dr. Loran Senters   LOS: 3 days    Ellwood Handler, PA-C 04/08/2021 Patient seen and examined. Given CXR findings and drop in Hct it is clear she has had bleeding in the chest. Will leave CT in place and monitor closely. May need to go back to OR for wash out Stop enoxaparin Continue SCD and ambulation Path pending  Remo Lipps C. Roxan Hockey, MD [facility] [phone]

## 2021-04-08 NOTE — Care Management Important Message (Signed)
Important Message  Patient Details  Name: Sara Coffey MRN: [id number] Date of Birth: 1951-03-23   Medicare Important Message Given:  Yes     Orbie Pyo 04/08/2021, 3:08 PM

## 2021-04-08 NOTE — Discharge Summary (Addendum)
Physician Discharge Summary  Patient ID: Sara Coffey Coffey MRN: [id number] DOB/AGE: 68-Aug-1952 68 y.o.  Admit date: 04/05/2021 Discharge date: 04/22/2021  Admission Diagnoses: Left lower lobe lung nodule  Patient Active Problem List   Diagnosis Date Noted   Cancer of upper lobe of left lung ([place]) 03/06/2021   Postoperative anemia due to acute blood loss 04/12/2020   Cancer of ascending colon ([place]) 04/09/2020   Malignant neoplasm of ascending colon ([place]) 03/22/2020   Osteoporosis    Encounter for gynecological examination with Papanicolaou smear of cervix 10/04/2019   Routine cervical smear 10/04/2019   Screening for colorectal cancer 10/04/2019   H/O: osteoarthritis 09/19/2019   H/O squamous cell carcinoma of skin 09/19/2019   Heart murmur 04/17/2019   Hyperlipidemia 11/09/2013   IFG (impaired fasting glucose) 11/09/2013   Tobacco abuse 09/28/2013   Discharge Diagnoses: Adenocarcinoma left lower lobe- stage  IA(T1N0) Patient Active Problem List   Diagnosis Date Noted   Lung nodule 04/08/2021   S/P partial lobectomy of lung 04/05/2021   Postoperative anemia due to acute blood loss 04/12/2020   Cancer of ascending colon ([place]) 04/09/2020   Malignant neoplasm of ascending colon ([place]) 03/22/2020   Osteoporosis    Encounter for gynecological examination with Papanicolaou smear of cervix 10/04/2019   Routine cervical smear 10/04/2019   Screening for colorectal cancer 10/04/2019   H/O: osteoarthritis 09/19/2019   H/O squamous cell carcinoma of skin 09/19/2019   Heart murmur 04/17/2019   Hyperlipidemia 11/09/2013   IFG (impaired fasting glucose) 11/09/2013   Tobacco abuse 09/28/2013   Discharged Condition: good  History of Present Illness:  Sara Coffey Coffey is a 70 year old woman with a history of tobacco abuse, hyperlipidemia, osteoporosis, syncope with transient global amnesia and possible transient ischemic attack, and colon cancer.  She had surgery for colon cancer a year ago.   She did not require adjuvant therapy.  In September 2021 she was noted to have a groundglass opacity in the superior segment of the left lower lobe.  It was about 7 mm in diameter.  Recently she had a follow-up CT that showed the nodule was about 8 mm in diameter but now had a solid component.  There was no adenopathy.  She smoked about a pack a day for 40 years.  She quit 2 weeks ago but has been vaping instead.  She denies any chest pain, pressure, or tightness.  She denies shortness of breath, wheezing, and cough.  She is active and push mows her own yard without any issues.  She has not had any change in appetite or weight loss.  IShe was evaluated by Dr. Roxan Coffey who felt the patient would benefit from surgical resection for definitive diagnosis.  The risks and benefits of the procedure were explained to the patient and he was agreeable to proceed.  Hospital Course:  [name] Coffey presented to [facility] on 04/05/2021.  She was taken to the operating room and underwent Robotic Assisted Video Thoracoscopy, Mini Thoracotomy with superior segmentectomy of LLL, Lymph Node Dissection, and Intercostal Nerve Block.  She tolerated the procedure without difficulty and was taken to the PACU in stable condition. We continued her chest tube to water seal and her output remained high. She was weaned off supplemental oxygen and tolerating room air with good saturation.  CXR showed increase in left pleural effusion/atelectasis.  Her hemoglobin level decreased to 7.8 mg.  It was felt she likely suffered bleeding in her chest.  Her Lovenox was discontinued.  Her hemoglobin  level decreased further to 7.5  CXR showed slight increase in left pleural effusion.  Her chest tube output remained low.  It was felt she would require Re-exploration in the operating room for evacuation of hematoma.  This was completed on 04/10/2021 when she underwent Video Assisted Thoracoscopy with drainage of hemothorax and wedge resection  LLL.  The patient tolerated the procedure without difficulty.  Her hemoglobin level decreased further to 6.5.  She was transfused 2 units of packed cell with repeat H/H of 8.4.  Her chest xray showed improvement of hemothorax.  There was however a new trace apical pneumothorax.  Her chest tube was placed to water seal on 04/11/2021.  It was free from air leak and her chest tube was removed on 04/12/2021.  Follow up CXR showed minimal left apical pneumothorax.  Her hemoglobin level has remained stable with most recent level being 8.0.  It has been difficult to wean the patient off oxygen but sats are acceptable on RA currently.  Her surgical incisions are healing without evidence of infection.  She is ambulating and is felt medically stable for discharge home today.  Consults: None  Significant Diagnostic Studies: nuclear medicine:   1. No significant metabolic activity associated with the part solid nodule in the superior segment of the LEFT lower lobe. Recommend continued CT surveillance as low-grade adenocarcinoma cannot be excluded. 2. No metastatic adenopathy or distant malignancy.  Treatments: surgery:   PATIENT:  Sara Coffey Coffey  70 y.o. female   PRE-OPERATIVE DIAGNOSIS:  LEFT LOWER LOBE NODULE   POST-OPERATIVE DIAGNOSIS:  LEFT LOWER LOBE NODULE   PROCEDURE:  Procedure(s):   -XI ROBOTIC ASSISTED LEFT THORACOSCOPY/ MINI THORACOTOMY -LEFT LOWER LOBE SUPERIOR SEGMENTECTOMY -WEDGE RESECTION LEFT UPPER LOBE -LYMPH NODE DISSECTION -INTERCOSTAL NERVE BLOCK LEVELS 3-10   SURGEON:  Surgeon(s) and Role:    * [name], MD - Primary   PHYSICIAN ASSISTANT: Ellwood Handler PA-C   PATHOLOGY:  SURGICAL PATHOLOGY  CASE: [id number]  PATIENT: Sara Coffey Coffey  Surgical Pathology Report      Clinical History: left lower lobe nodule (cm)      FINAL MICROSCOPIC DIAGNOSIS:   A. LUNG, LEFT LOWER LOBE, WEDGE RESECTION:  -  Invasive acinar adenocarcinoma, 0.7 cm  -  Margins  uninvolved by adenocarcinoma  -  See oncology table and comment below   B. LUNG, LEFT LOWER LOBE REMAINDER OF SUPERIOR SEGMENT, WEDGE RESECTION:  -  No residual carcinoma identified  -  No carcinoma identified in one lymph node (0/1)   C. LUNG, LEFT UPPER LOBE MARGIN, WEDGE RESECTION:  -  No carcinoma identified   D. LYMPH NODE, LEVEL 9, EXCISION:  -  No carcinoma identified in one lymph node (0/1)   E. LYMPH NODE, LEVEL 7, EXCISION:  -  No carcinoma identified in one lymph node (0/1)   F. LYMPH NODE, LEVEL 10, EXCISION:  -  No carcinoma identified in one lymph node (0/1)   G. LYMPH NODE, LEVEL 4L, EXCISION:  -  No carcinoma identified in one lymph node (0/1)   H. LYMPH NODE, LEVEL 8, EXCISION:  -  No carcinoma identified in one lymph node (0/1)   I. LYMPH NODE, LEVEL 12, EXCISION:  -  No carcinoma identified in one lymph node (0/1)   J. LYMPH NODE, LEVEL 5, EXCISION:  -  No carcinoma identified in one lymph node (0/1)   K. LYMPH NODE, LEVEL 11, EXCISION:  -  No carcinoma identified in one lymph node (0/1)  L. LYMPH NODE, LEVEL 11 #2, EXCISION:  -  No carcinoma identified in one lymph node (0/1)   M. LYMPH NODE, LEVEL 13, EXCISION:  -  No carcinoma identified in one lymph node (0/1)   Discharge Exam: Blood pressure 113/71, pulse 91, temperature 98 F (36.7 C), temperature source Oral, resp. rate 19, height 5\' 4"  (1.626 m), weight 62.6 kg, SpO2 94 %.  General appearance: alert, cooperative, and no distress Heart: regular rate and rhythm Lungs: clear to auscultation bilaterally Abdomen: benign Extremities: minor edema Wound: incis healing well Disposition: Discharge disposition: 01-Home or Self Care      Discharge Instructions     Discharge patient   Complete by: As directed    Discharge disposition: 01-Home or Self Care   Discharge patient date: 04/13/2021      Allergies as of 04/13/2021   No Known Allergies      Medication List     TAKE these  medications    acetaminophen 500 MG tablet Commonly known as: TYLENOL Take 2 tablets (1,000 mg total) by mouth every 6 (six) hours as needed for mild pain, fever or headache.   calcium carbonate 500 MG chewable tablet Commonly known as: TUMS - dosed in mg elemental calcium Chew 1 tablet by mouth as needed for indigestion or heartburn.   Calcium-Vitamin D 600-400 MG-UNIT Tabs Take 1 tablet by mouth every other day.   Fish Oil 1000 MG Caps Take 2,000 mg by mouth in the morning.   Iron 325 (65 Fe) MG Tabs Take 325 mg by mouth every other day. In the morning   Melatonin 10 MG Tabs Take 10 mg by mouth at bedtime as needed (sleep).   multivitamin with minerals tablet Take 1 tablet by mouth daily. Multivitamin for Women 50+   rosuvastatin 5 MG tablet Commonly known as: Crestor Take 1 tablet (5 mg total) by mouth daily.   vitamin B-12 1000 MCG tablet Commonly known as: CYANOCOBALAMIN Take 2,000 mcg by mouth daily.       ASK your doctor about these medications    oxyCODONE 5 MG immediate release tablet Commonly known as: Oxy IR/ROXICODONE Take 1 tablet (5 mg total) by mouth every 6 (six) hours as needed for up to 7 days for moderate pain. Ask about: Should I take this medication?        Follow-up Information     [name], MD Follow up on 04/23/2021.   Specialty: Cardiothoracic Surgery Why: Appointment is at 4:15, please get CXR at 3:45 at [facility] located on first floor of our office building Contact information: [place] [address] [phone]                 Signed: [name], MD  04/22/2021, 8:46 PM

## 2021-04-08 NOTE — Op Note (Signed)
NAME: Sara, Coffey MEDICAL RECORD NO: [id number] ACCOUNT NO: [id number] DATE OF BIRTH: [birth year] FACILITY: [facility] LOCATION: [facility] PHYSICIAN: Revonda Standard. Roxan Hockey, MD  Operative Report   DATE OF PROCEDURE: 04/05/2021  PREOPERATIVE DIAGNOSIS:  Left lower lobe nodule.  POSTOPERATIVE DIAGNOSIS:  Left lower lobe nodule.  PROCEDURE PERFORMED:   Xi robotic-assisted left thoracoscopy with conversion to mini thoracotomy,  Wedge resection of left lower lobe,  Left lower lobe superior segmentectomy,  Wedge resection of left upper lobe,  Lymph node dissection, and  Intercostal nerve blocks levels 3 through 10.  SURGEON:  Revonda Standard. Roxan Hockey, MD  ASSISTANT:  Ellwood Handler, PA.  ANESTHESIA:  General.  FINDINGS:  Frozen section nondiagnostic.  CLINICAL NOTE:  The patient is a 70 year old woman with a past history of smoking, who also has a history of colon cancer. In 04/2020, she was found to have a ground-glass opacity in the superior segment of the left lower lobe.  More recently, the nodule  had increased slightly in diameter, but also had a small solid component.  There was no adenopathy.  She was advised to undergo surgical resection with plan for a left lower lobe superior segmentectomy.  Indications, risks, benefits, and alternatives were  discussed in detail with the patient.  She understood and accepted the risks and agreed to proceed.  DESCRIPTION OF PROCEDURE:  The patient was brought to the operating room on 04/05/2021.  She had induction of general anesthesia and was intubated with a double-lumen endotracheal tube.  Intravenous antibiotics were administered.  Sequential compression  devices were placed on the calves for DVT prophylaxis.  A Foley catheter was placed.  She was placed in a right lateral decubitus position, and the left chest was prepped and draped in the usual sterile fashion.  Single lung ventilation of the right lung  was initiated and was tolerated well  throughout the procedure.  A timeout was performed.  A solution containing 20 mL of liposomal bupivacaine, 30 mL of 0.5% bupivacaine, and 50 mL of saline was prepared.  This was used for local at the incision sites as well as for the intercostal nerve blocks.  Incision was made in  the eighth interspace in the mid axillary line.  An 8 mm robotic port was inserted.  The thoracoscope was advanced into the chest. After confirming intrapleural placement, carbon dioxide was insufflated per protocol.  A 12 mm port was placed in the  eighth interspace anteriorly, and a 12 mm AirSeal port was placed in the tenth interspace.  Intercostal nerve blocks then were performed from the third to the tenth interspace.  10 mL of the bupivacaine solution was injected into a subpleural plane at  each level.  Two additional robotic ports were placed, and these were placed in the ninth interspace as it was a wider interspace than eighth.  The robot was deployed.  The camera arm was docked.  Targeting was performed.  The remaining arms were docked.   The robotic instruments were inserted with thoracoscopic visualization.  The dissection was begun with the inferior pulmonary ligament. Dissection was performed with bipolar cautery.  All lymph nodes that were removed were sent as separate specimens for permanent pathology.  The pleural reflection was divided at the hilum  posteriorly. Level 8 and 7 nodes were removed.  It was noted that the fissure was incomplete.  The AP window was opened, and level 5 node was removed. Moving anteriorly, it was clear that there was essentially no fissure except for  a very small portion  anteriorly and posteriorly. In the mid portion of the lung, the fissure was completely indistinct. Dissection was initiated in the anterior hilum, and there was unusual pulmonary artery anatomy there.  Given the small size of the nodule and its  peripheral location, it was felt that a wedge resection would be  preferable to a complex dissection that was performed with sequential firings of the robotic stapler.  The wedge was taken in such a way that it should have included the nodule based on its  location on the CT scan, but when the specimen was removed, there was no clear nodule. A couple of sites were marked with sutures and it was sent for pathologic examination, but neither site showed definite lesion.  Therefore, decision was made to  proceed with a formal superior segmentectomy.  This was a slow and tedious process.  The fissure was ultimately completed working anteriorly to posteriorly. Again, this was done with the robotic stapler.  There was an unusual branching pattern to the  pulmonary arteries, but once the plane was developed over the pulmonary artery, the division of the fissure proceeded.  The fissure was relatively well defined posteriorly.  The superior segmental artery was identified.  There were two branches to the  superior segment.  These were encircled and divided with the vascular stapler.  Working from the posterior approach, the bronchial anatomy was unclear, but ultimately the superior segmental bronchus was identified, encircled, and divided with the robotic  stapler.  Intravenous ICG was administered to demarcate the upper lobe using the Firefly setting on the robotic console, and inflation of the left lung was also used to identify the demarcation, and the vascular and airway demarcation were at the same  site.  A superior segmentectomy then was completed using the robotic stapler.  The specimen removed was relatively small as most of the superior segment had been removed with wedge resection.  There was a palpable nodule, which was marked and sent.  It  was in close proximity to the previous staple lines.  This was sent for frozen section, returned showing only a benign lymph node.  The chest was copiously irrigated with warm saline.  A test inflation showed that there was a leak and  it turned out that  there was a tear in the membranous bronchus of the left lower lobe bronchus adjacent to the takeoff of the superior segmental bronchus.  Decision was made to convert to a mini thoracotomy and repair this in an open fashion.  The robot was undocked.  The  robotic ports were removed.  The scope was still used for visualization.  The two central incisions were connected and the latissimus was divided.  The serratus muscles were spared.  The soft tissue was retracted, but the ribs did not need to be  retracted.  The tear in the membranous bronchus was repaired with interrupted 3-0 PDS sutures.  There was no leak with insufflation to 30 cm of water after the repair. Palpation of the lower lobe did not reveal any nodules.  There was a tear in the upper  lobe posteriorly where the fissure had been completed, and this area was resected as a wedge resection and sent for permanent pathology.  Chest was copiously irrigated with warm saline.  A 28-French Blake drain was placed through the tenth interspace  incision and secured with #1 silk suture.  A single pericostal suture was used to reapproximate the ribs.  Dual lung  ventilation was resumed.  The remaining incisions were closed in standard fashion.  The chest tube was placed to a Pleur-Evac on suction.   The patient then was placed back in the supine position.  She was extubated in the operating room and taken to the postanesthetic care unit in good condition.  All sponge, needle, and instrument counts were correct at the end of the procedure.   [facility] D: 04/08/2021 5:54:36 pm T: 04/08/2021 9:29:00 pm  JOB: [id number]/ [id number]

## 2021-04-08 NOTE — Plan of Care (Signed)
  Problem: Clinical Measurements: Goal: Respiratory complications will improve Outcome: Not Progressing   

## 2021-04-09 ENCOUNTER — Inpatient Hospital Stay (HOSPITAL_COMMUNITY): Payer: Medicare HMO

## 2021-04-09 LAB — BASIC METABOLIC PANEL
Anion gap: 5 (ref 5–15)
BUN: 11 mg/dL (ref 8–23)
CO2: 26 mmol/L (ref 22–32)
Calcium: 8.4 mg/dL — ABNORMAL LOW (ref 8.9–10.3)
Chloride: 102 mmol/L (ref 98–111)
Creatinine, Ser: 0.9 mg/dL (ref 0.44–1.00)
GFR, Estimated: >60 mL/min (ref >60)
Glucose, Bld: 120 mg/dL — ABNORMAL HIGH (ref 70–99)
Potassium: 5.2 mmol/L — ABNORMAL HIGH (ref 3.5–5.1)
Sodium: 133 mmol/L — ABNORMAL LOW (ref 135–145)

## 2021-04-09 LAB — CBC
HCT: 22.4 % — ABNORMAL LOW (ref 36.0–46.0)
Hemoglobin: 7.5 g/dL — ABNORMAL LOW (ref 12.0–15.0)
MCH: 31.8 pg (ref 26.0–34.0)
MCHC: 33.5 g/dL (ref 30.0–36.0)
MCV: 94.9 fL (ref 80.0–100.0)
Platelets: 260 10*3/uL (ref 150–400)
RBC: 2.36 MIL/uL — ABNORMAL LOW (ref 3.87–5.11)
RDW: 12.4 % (ref 11.5–15.5)
WBC: 14.9 10*3/uL — ABNORMAL HIGH (ref 4.0–10.5)
nRBC: 0 % (ref 0.0–0.2)

## 2021-04-09 MED ORDER — VANCOMYCIN HCL IN DEXTROSE 1-5 GM/200ML-% IV SOLN
1000.0000 mg | INTRAVENOUS | Status: AC
  Administered 2021-04-10: 1000 mg via INTRAVENOUS
  Filled 2021-04-09 (×2): qty 200

## 2021-04-09 NOTE — Plan of Care (Signed)
  Problem: Clinical Measurements: Goal: Diagnostic test results will improve Outcome: Not Progressing

## 2021-04-09 NOTE — H&P (View-Only) (Signed)
      [address]       [place] [address]             [phone]      4 Days Post-Op Procedure(s) (LRB): XI ROBOTIC ASSISTED THORACOSCOPY-LEFT LOWER LOBE SUPERIOR SEGMENTECTOMY (Left) LYMPH NODE DISSECTION (Left) THORACOTOMY MAJOR INTERCOSTAL NERVE BLOCK  Subjective: Patient continues to feels well.  She denies pain and shortness of breath. + ambulation.     Objective: Vital signs in last 24 hours: Temp:  [98.2 F (36.8 C)-100.5 F (38.1 C)] 99.5 F (37.5 C) (08/23 0411) Pulse Rate:  [98-112] 98 (08/23 0411) Cardiac Rhythm: Sinus tachycardia (08/23 0700) Resp:  [18-20] 18 (08/23 0411) BP: (84-106)/(52-62) 106/58 (08/23 0411) SpO2:  [90 %-97 %] 95 % (08/23 0411)  Intake/Output from previous day: 08/22 0701 - 08/23 0700 In: 240 [P.O.:240] Out: 1150 [Urine:1000; Chest Tube:150]  General appearance: alert, cooperative, and no distress Heart: regular rate and rhythm Lungs: tachy Abdomen: soft, non-tender; bowel sounds normal; no masses,  no organomegaly Extremities: extremities normal, atraumatic, no cyanosis or edema Wound: clean and dry, some swelling at thoracotomy site  Lab Results: Recent Labs    04/08/21 0102 04/09/21 0008  WBC 11.7* 14.9*  HGB 7.8* 7.5*  HCT 22.7* 22.4*  PLT 183 260   BMET:  Recent Labs    04/08/21 0102 04/09/21 0008  NA 131* 133*  K 4.4 5.2*  CL 100 102  CO2 26 26  GLUCOSE 123* 120*  BUN 11 11  CREATININE 0.86 0.90  CALCIUM 8.3* 8.4*    PT/INR: No results for input(s): LABPROT, INR in the last 72 hours. ABG    Component Value Date/Time   PHART 7.414 04/06/2021 0450   HCO3 24.4 04/06/2021 0450   TCO2 23 04/17/2019 1421   ACIDBASEDEF 0.3 04/03/2021 0950   O2SAT 96.2 04/06/2021 0450   CBG (last 3)  No results for input(s): GLUCAP in the last 72 hours.  Assessment/Plan: S/P Procedure(s) (LRB): XI ROBOTIC ASSISTED THORACOSCOPY-LEFT LOWER LOBE SUPERIOR SEGMENTECTOMY (Left) LYMPH NODE DISSECTION  (Left) THORACOTOMY MAJOR INTERCOSTAL NERVE BLOCK  CV- Sinus Tachycardia, BP stable Pulm- CT output continues to improve 150 cc output yesterday, no air leak present, CXR with worsening pleural effusion/atelectasis, no oxygen requirement Hyperkalemia- K is at 5.2 monitor, she is not currently on supplementation Blood Loss Anemia- Hgb down to 7.3 Dispo- patient stable, slight increase in pleural effusion on left, hgb dropped slightly to 7.3, K is at 5.2... patient hasn't eaten breakfast yet, just drank some water... she was told to hold off on further oral intake until evaluated by Dr. Roxan Hockey in case she needs to return to the operating room for exploration and washout   LOS: 4 days   Ellwood Handler, PA-C 04/09/2021 Patient seen and examined, agree with above Loculated hemothorax, not draining with CT She is going to need a reexploration to evacuate the hemothorax. No signs of active bleeding at this point and stable- will plan OR tomorrow AM I discussed the procedure with Mrs. Mcleish and her daughter. She understands the issues involved and agrees to proceed  Path still pending  Remo Lipps C. Roxan Hockey, MD [facility] [phone]'

## 2021-04-09 NOTE — Progress Notes (Addendum)
      [address]       [place] [address]             [phone]      4 Days Post-Op Procedure(s) (LRB): XI ROBOTIC ASSISTED THORACOSCOPY-LEFT LOWER LOBE SUPERIOR SEGMENTECTOMY (Left) LYMPH NODE DISSECTION (Left) THORACOTOMY MAJOR INTERCOSTAL NERVE BLOCK  Subjective: Patient continues to feels well.  She denies pain and shortness of breath. + ambulation.     Objective: Vital signs in last 24 hours: Temp:  [98.2 F (36.8 C)-100.5 F (38.1 C)] 99.5 F (37.5 C) (08/23 0411) Pulse Rate:  [98-112] 98 (08/23 0411) Cardiac Rhythm: Sinus tachycardia (08/23 0700) Resp:  [18-20] 18 (08/23 0411) BP: (84-106)/(52-62) 106/58 (08/23 0411) SpO2:  [90 %-97 %] 95 % (08/23 0411)  Intake/Output from previous day: 08/22 0701 - 08/23 0700 In: 240 [P.O.:240] Out: 1150 [Urine:1000; Chest Tube:150]  General appearance: alert, cooperative, and no distress Heart: regular rate and rhythm Lungs: tachy Abdomen: soft, non-tender; bowel sounds normal; no masses,  no organomegaly Extremities: extremities normal, atraumatic, no cyanosis or edema Wound: clean and dry, some swelling at thoracotomy site  Lab Results: Recent Labs    04/08/21 0102 04/09/21 0008  WBC 11.7* 14.9*  HGB 7.8* 7.5*  HCT 22.7* 22.4*  PLT 183 260   BMET:  Recent Labs    04/08/21 0102 04/09/21 0008  NA 131* 133*  K 4.4 5.2*  CL 100 102  CO2 26 26  GLUCOSE 123* 120*  BUN 11 11  CREATININE 0.86 0.90  CALCIUM 8.3* 8.4*    PT/INR: No results for input(s): LABPROT, INR in the last 72 hours. ABG    Component Value Date/Time   PHART 7.414 04/06/2021 0450   HCO3 24.4 04/06/2021 0450   TCO2 23 04/17/2019 1421   ACIDBASEDEF 0.3 04/03/2021 0950   O2SAT 96.2 04/06/2021 0450   CBG (last 3)  No results for input(s): GLUCAP in the last 72 hours.  Assessment/Plan: S/P Procedure(s) (LRB): XI ROBOTIC ASSISTED THORACOSCOPY-LEFT LOWER LOBE SUPERIOR SEGMENTECTOMY (Left) LYMPH NODE DISSECTION  (Left) THORACOTOMY MAJOR INTERCOSTAL NERVE BLOCK  CV- Sinus Tachycardia, BP stable Pulm- CT output continues to improve 150 cc output yesterday, no air leak present, CXR with worsening pleural effusion/atelectasis, no oxygen requirement Hyperkalemia- K is at 5.2 monitor, she is not currently on supplementation Blood Loss Anemia- Hgb down to 7.3 Dispo- patient stable, slight increase in pleural effusion on left, hgb dropped slightly to 7.3, K is at 5.2... patient hasn't eaten breakfast yet, just drank some water... she was told to hold off on further oral intake until evaluated by Dr. Roxan Hockey in case she needs to return to the operating room for exploration and washout   LOS: 4 days   Ellwood Handler, PA-C 04/09/2021 Patient seen and examined, agree with above Loculated hemothorax, not draining with CT She is going to need a reexploration to evacuate the hemothorax. No signs of active bleeding at this point and stable- will plan OR tomorrow AM I discussed the procedure with Sara Coffey and her daughter. She understands the issues involved and agrees to proceed  Path still pending  Remo Lipps C. Roxan Hockey, MD [facility] [phone]'

## 2021-04-10 ENCOUNTER — Inpatient Hospital Stay (HOSPITAL_COMMUNITY): Payer: Medicare HMO

## 2021-04-10 ENCOUNTER — Encounter (HOSPITAL_COMMUNITY)
Admission: RE | Disposition: A | Discharge status: Home / Self Care | Payer: Self-pay | Source: Ambulatory Visit | Attending: Thoracic Surgery (Cardiothoracic Vascular Surgery)

## 2021-04-10 ENCOUNTER — Encounter (HOSPITAL_COMMUNITY): Payer: Self-pay | Admitting: Thoracic Surgery (Cardiothoracic Vascular Surgery)

## 2021-04-10 DIAGNOSIS — I9789 Other postprocedural complications and disorders of the circulatory system, not elsewhere classified: Secondary | ICD-10-CM

## 2021-04-10 HISTORY — PX: VIDEO ASSISTED THORACOSCOPY: SHX5073

## 2021-04-10 LAB — SURGICAL PATHOLOGY

## 2021-04-10 LAB — BASIC METABOLIC PANEL
Anion gap: 7 (ref 5–15)
BUN: 11 mg/dL (ref 8–23)
CO2: 25 mmol/L (ref 22–32)
Calcium: 7.8 mg/dL — ABNORMAL LOW (ref 8.9–10.3)
Chloride: 99 mmol/L (ref 98–111)
Creatinine, Ser: 0.79 mg/dL (ref 0.44–1.00)
GFR, Estimated: >60 mL/min (ref >60)
Glucose, Bld: 112 mg/dL — ABNORMAL HIGH (ref 70–99)
Potassium: 4 mmol/L (ref 3.5–5.1)
Sodium: 131 mmol/L — ABNORMAL LOW (ref 135–145)

## 2021-04-10 SURGERY — VIDEO ASSISTED THORACOSCOPY
Anesthesia: General | Site: Chest | Laterality: Left

## 2021-04-10 MED ORDER — ROCURONIUM BROMIDE 10 MG/ML (PF) SYRINGE
PREFILLED_SYRINGE | INTRAVENOUS | Status: DC | PRN
  Administered 2021-04-10: 60 mg via INTRAVENOUS
  Administered 2021-04-10: 20 mg via INTRAVENOUS

## 2021-04-10 MED ORDER — DEXAMETHASONE SODIUM PHOSPHATE 10 MG/ML IJ SOLN
INTRAMUSCULAR | Status: AC
  Filled 2021-04-10: qty 1

## 2021-04-10 MED ORDER — HYDROMORPHONE HCL 1 MG/ML IJ SOLN
INTRAMUSCULAR | Status: AC
  Administered 2021-04-10: 0.5 mg via INTRAVENOUS
  Filled 2021-04-10: qty 1

## 2021-04-10 MED ORDER — LACTATED RINGERS IV SOLN: INTRAVENOUS | Status: DC | PRN

## 2021-04-10 MED ORDER — CHLORHEXIDINE GLUCONATE 0.12 % MT SOLN
15.0000 mL | Freq: Once | OROMUCOSAL | Status: AC
  Administered 2021-04-10: 15 mL via OROMUCOSAL
  Filled 2021-04-10: qty 15

## 2021-04-10 MED ORDER — MIDAZOLAM HCL 2 MG/2ML IJ SOLN
INTRAMUSCULAR | Status: AC
  Administered 2021-04-10: 1 mg via INTRAVENOUS
  Filled 2021-04-10: qty 2

## 2021-04-10 MED ORDER — MIDAZOLAM HCL 2 MG/2ML IJ SOLN
INTRAMUSCULAR | Status: AC
  Filled 2021-04-10: qty 2

## 2021-04-10 MED ORDER — ROCURONIUM BROMIDE 10 MG/ML (PF) SYRINGE
PREFILLED_SYRINGE | INTRAVENOUS | Status: AC
  Filled 2021-04-10: qty 10

## 2021-04-10 MED ORDER — FENTANYL CITRATE (PF) 250 MCG/5ML IJ SOLN
INTRAMUSCULAR | Status: AC
  Filled 2021-04-10: qty 5

## 2021-04-10 MED ORDER — ORAL CARE MOUTH RINSE: 15.0000 mL | Freq: Once | OROMUCOSAL | Status: AC

## 2021-04-10 MED ORDER — ONDANSETRON HCL 4 MG/2ML IJ SOLN
INTRAMUSCULAR | Status: AC
  Filled 2021-04-10: qty 2

## 2021-04-10 MED ORDER — MEPERIDINE HCL 25 MG/ML IJ SOLN: 6.2500 mg | INTRAMUSCULAR | Status: DC | PRN

## 2021-04-10 MED ORDER — LIDOCAINE 2% (20 MG/ML) 5 ML SYRINGE
INTRAMUSCULAR | Status: DC | PRN
  Administered 2021-04-10: 20 mg via INTRAVENOUS

## 2021-04-10 MED ORDER — FENTANYL CITRATE (PF) 100 MCG/2ML IJ SOLN
INTRAMUSCULAR | Status: DC | PRN
  Administered 2021-04-10: 50 ug via INTRAVENOUS
  Administered 2021-04-10: 100 ug via INTRAVENOUS
  Administered 2021-04-10: 50 ug via INTRAVENOUS
  Administered 2021-04-10: 100 ug via INTRAVENOUS
  Administered 2021-04-10: 50 ug via INTRAVENOUS

## 2021-04-10 MED ORDER — CHLORHEXIDINE GLUCONATE CLOTH 2 % EX PADS
6.0000 | MEDICATED_PAD | Freq: Every day | CUTANEOUS | Status: DC
  Administered 2021-04-10: 6 via TOPICAL

## 2021-04-10 MED ORDER — LEVALBUTEROL HCL 0.63 MG/3ML IN NEBU
0.6300 mg | INHALATION_SOLUTION | Freq: Three times a day (TID) | RESPIRATORY_TRACT | Status: DC
  Administered 2021-04-10 – 2021-04-12 (×5): 0.63 mg via RESPIRATORY_TRACT
  Filled 2021-04-10 (×5): qty 3

## 2021-04-10 MED ORDER — HYDROMORPHONE HCL 1 MG/ML IJ SOLN
0.2500 mg | INTRAMUSCULAR | Status: DC | PRN
  Administered 2021-04-10: 0.5 mg via INTRAVENOUS

## 2021-04-10 MED ORDER — STERILE WATER FOR IRRIGATION IR SOLN
Status: DC | PRN
  Administered 2021-04-10: 1000 mL

## 2021-04-10 MED ORDER — SUGAMMADEX SODIUM 200 MG/2ML IV SOLN
INTRAVENOUS | Status: DC | PRN
  Administered 2021-04-10: 250 mg via INTRAVENOUS

## 2021-04-10 MED ORDER — MIDAZOLAM HCL 2 MG/2ML IJ SOLN: 0.5000 mg | Freq: Once | INTRAMUSCULAR | Status: AC | PRN

## 2021-04-10 MED ORDER — 0.9 % SODIUM CHLORIDE (POUR BTL) OPTIME
TOPICAL | Status: DC | PRN
  Administered 2021-04-10: 2000 mL

## 2021-04-10 MED ORDER — PHENYLEPHRINE HCL-NACL 20-0.9 MG/250ML-% IV SOLN
INTRAVENOUS | Status: DC | PRN
  Administered 2021-04-10: 40 ug/min via INTRAVENOUS

## 2021-04-10 MED ORDER — PROMETHAZINE HCL 25 MG/ML IJ SOLN: 6.2500 mg | INTRAMUSCULAR | Status: DC | PRN

## 2021-04-10 MED ORDER — PROPOFOL 10 MG/ML IV BOLUS
INTRAVENOUS | Status: DC | PRN
Start: 2021-04-10 — End: 2021-04-10
  Administered 2021-04-10: 120 mg via INTRAVENOUS

## 2021-04-10 MED ORDER — OXYCODONE HCL 5 MG PO TABS: 5.0000 mg | ORAL_TABLET | Freq: Once | ORAL | Status: DC | PRN

## 2021-04-10 MED ORDER — LACTATED RINGERS IV SOLN: INTRAVENOUS | Status: DC

## 2021-04-10 MED ORDER — KETOROLAC TROMETHAMINE 30 MG/ML IJ SOLN
15.0000 mg | Freq: Four times a day (QID) | INTRAMUSCULAR | Status: DC | PRN
  Administered 2021-04-10 – 2021-04-13 (×4): 15 mg via INTRAVENOUS
  Filled 2021-04-10 (×4): qty 1

## 2021-04-10 MED ORDER — OXYCODONE HCL 5 MG/5ML PO SOLN
5.0000 mg | Freq: Once | ORAL | Status: DC | PRN
Start: 2021-04-10 — End: 2021-04-10

## 2021-04-10 MED ORDER — ONDANSETRON HCL 4 MG/2ML IJ SOLN
INTRAMUSCULAR | Status: DC | PRN
  Administered 2021-04-10: 4 mg via INTRAVENOUS

## 2021-04-10 MED ORDER — PHENYLEPHRINE 40 MCG/ML (10ML) SYRINGE FOR IV PUSH (FOR BLOOD PRESSURE SUPPORT): PREFILLED_SYRINGE | INTRAVENOUS | Status: DC | PRN

## 2021-04-10 MED ORDER — LIDOCAINE 2% (20 MG/ML) 5 ML SYRINGE
INTRAMUSCULAR | Status: AC
  Filled 2021-04-10: qty 5

## 2021-04-10 MED ORDER — DEXAMETHASONE SODIUM PHOSPHATE 10 MG/ML IJ SOLN
INTRAMUSCULAR | Status: DC | PRN
  Administered 2021-04-10: 5 mg via INTRAVENOUS

## 2021-04-10 MED ORDER — MIDAZOLAM HCL 2 MG/2ML IJ SOLN
INTRAMUSCULAR | Status: DC | PRN
  Administered 2021-04-10: 2 mg via INTRAVENOUS

## 2021-04-10 SURGICAL SUPPLY — 94 items
ADH SKN CLS APL DERMABOND .7 (GAUZE/BANDAGES/DRESSINGS) ×1
APL SWBSTK 6 STRL LF DISP (MISCELLANEOUS)
APPLICATOR COTTON TIP 6 STRL (MISCELLANEOUS) IMPLANT
APPLICATOR COTTON TIP 6IN STRL (MISCELLANEOUS) IMPLANT
APPLIER CLIP ROT 10 11.4 M/L (STAPLE)
APR CLP MED LRG 11.4X10 (STAPLE)
BAG SPEC RTRVL LRG 6X4 10 (ENDOMECHANICALS)
BLADE CLIPPER SURG (BLADE) ×1 IMPLANT
CANISTER SUCT 3000ML PPV (MISCELLANEOUS) ×2 IMPLANT
CATH THORACIC 28FR RT ANG (CATHETERS) IMPLANT
CATH THORACIC 36FR (CATHETERS) IMPLANT
CATH THORACIC 36FR RT ANG (CATHETERS) IMPLANT
CLIP APPLIE ROT 10 11.4 M/L (STAPLE) IMPLANT
CLIP TI MEDIUM 24 (CLIP) ×1 IMPLANT
CLIP VESOCCLUDE MED 6/CT (CLIP) IMPLANT
CNTNR URN SCR LID CUP LEK RST (MISCELLANEOUS) ×2 IMPLANT
CONN Y 3/8X3/8X3/8  BEN (MISCELLANEOUS)
CONN Y 3/8X3/8X3/8 BEN (MISCELLANEOUS) ×1 IMPLANT
CONT SPEC 4OZ STRL OR WHT (MISCELLANEOUS) ×4
COVER SURGICAL LIGHT HANDLE (MISCELLANEOUS) ×2 IMPLANT
CUTTER ECHEON FLEX ENDO 45 340 (ENDOMECHANICALS) ×1 IMPLANT
DERMABOND ADVANCED (GAUZE/BANDAGES/DRESSINGS) ×1
DERMABOND ADVANCED .7 DNX12 (GAUZE/BANDAGES/DRESSINGS) IMPLANT
DRAIN CHANNEL 28F RND 3/8 FF (WOUND CARE) ×1 IMPLANT
DRAIN CHANNEL 32F RND 10.7 FF (WOUND CARE) IMPLANT
DRAPE CV SPLIT W-CLR ANES SCRN (DRAPES) ×2 IMPLANT
DRAPE ORTHO SPLIT 77X108 STRL (DRAPES) ×2
DRAPE SURG ORHT 6 SPLT 77X108 (DRAPES) ×1 IMPLANT
DRAPE WARM FLUID 44X44 (DRAPES) ×2 IMPLANT
DRSG AQUACEL AG ADV 3.5X 6 (GAUZE/BANDAGES/DRESSINGS) IMPLANT
DRSG AQUACEL AG ADV 3.5X10 (GAUZE/BANDAGES/DRESSINGS) ×1 IMPLANT
ELECT BLADE 6.5 EXT (BLADE) ×2 IMPLANT
ELECT REM PT RETURN 9FT ADLT (ELECTROSURGICAL) ×2
ELECTRODE REM PT RTRN 9FT ADLT (ELECTROSURGICAL) ×1 IMPLANT
GAUZE SPONGE 4X4 12PLY STRL (GAUZE/BANDAGES/DRESSINGS) ×2 IMPLANT
GLOVE SURG SIGNA 7.5 PF LTX (GLOVE) ×4 IMPLANT
GLOVE TRIUMPH SURG SIZE 7.5 (KITS) ×2 IMPLANT
GOWN STRL REUS W/ TWL LRG LVL3 (GOWN DISPOSABLE) ×2 IMPLANT
GOWN STRL REUS W/ TWL XL LVL3 (GOWN DISPOSABLE) ×1 IMPLANT
GOWN STRL REUS W/TWL LRG LVL3 (GOWN DISPOSABLE) ×6
GOWN STRL REUS W/TWL XL LVL3 (GOWN DISPOSABLE) ×2
HEMOSTAT SURGICEL 2X14 (HEMOSTASIS) IMPLANT
IV CATH 22GX1 FEP (IV SOLUTION) IMPLANT
KIT BASIN OR (CUSTOM PROCEDURE TRAY) ×2 IMPLANT
KIT SUCTION CATH 14FR (SUCTIONS) ×2 IMPLANT
KIT TURNOVER KIT B (KITS) ×2 IMPLANT
NDL HYPO 25GX1X1/2 BEV (NEEDLE) ×1 IMPLANT
NEEDLE HYPO 25GX1X1/2 BEV (NEEDLE) ×2 IMPLANT
NS IRRIG 1000ML POUR BTL (IV SOLUTION) ×4 IMPLANT
PACK CHEST (CUSTOM PROCEDURE TRAY) ×2 IMPLANT
PAD ARMBOARD 7.5X6 YLW CONV (MISCELLANEOUS) ×4 IMPLANT
PASSER SUT SWANSON 36MM LOOP (INSTRUMENTS) ×1 IMPLANT
POUCH ENDO CATCH II 15MM (MISCELLANEOUS) IMPLANT
POUCH SPECIMEN RETRIEVAL 10MM (ENDOMECHANICALS) IMPLANT
RELOAD STAPLE 45 BLK VRY/THCK (STAPLE) IMPLANT
SEALANT PROGEL (MISCELLANEOUS) IMPLANT
SEALANT SURG COSEAL 4ML (VASCULAR PRODUCTS) IMPLANT
SEALANT SURG COSEAL 8ML (VASCULAR PRODUCTS) IMPLANT
SOL ANTI FOG 6CC (MISCELLANEOUS) ×1 IMPLANT
SOLUTION ANTI FOG 6CC (MISCELLANEOUS) ×1
SPECIMEN JAR MEDIUM (MISCELLANEOUS) ×1 IMPLANT
SPONGE INTESTINAL PEANUT (DISPOSABLE) ×4 IMPLANT
SPONGE TONSIL TAPE 1 RFD (DISPOSABLE) ×2 IMPLANT
STAPLE RELOAD 45MM BLK (STAPLE) ×6 IMPLANT
STOPCOCK 4 WAY LG BORE MALE ST (IV SETS) ×1 IMPLANT
SUT PROLENE 4 0 RB 1 (SUTURE)
SUT PROLENE 4-0 RB1 .5 CRCL 36 (SUTURE) IMPLANT
SUT SILK  1 MH (SUTURE) ×4
SUT SILK 1 MH (SUTURE) ×2 IMPLANT
SUT SILK 2 0SH CR/8 30 (SUTURE) IMPLANT
SUT SILK 3 0SH CR/8 30 (SUTURE) IMPLANT
SUT VIC AB 1 CTX 36 (SUTURE)
SUT VIC AB 1 CTX36XBRD ANBCTR (SUTURE) IMPLANT
SUT VIC AB 2-0 CTX 36 (SUTURE) ×1 IMPLANT
SUT VIC AB 2-0 UR6 27 (SUTURE) ×1 IMPLANT
SUT VIC AB 3-0 MH 27 (SUTURE) IMPLANT
SUT VIC AB 3-0 X1 27 (SUTURE) ×3 IMPLANT
SUT VICRYL 2 TP 1 (SUTURE) ×1 IMPLANT
SYR 10ML LL (SYRINGE) ×2 IMPLANT
SYR 20ML LL LF (SYRINGE) IMPLANT
SYR 50ML LL SCALE MARK (SYRINGE) ×1 IMPLANT
SYSTEM SAHARA CHEST DRAIN ATS (WOUND CARE) ×2 IMPLANT
TAPE CLOTH 4X10 WHT NS (GAUZE/BANDAGES/DRESSINGS) ×2 IMPLANT
TAPE CLOTH SURG 4X10 WHT LF (GAUZE/BANDAGES/DRESSINGS) ×1 IMPLANT
TIP APPLICATOR SPRAY EXTEND 16 (VASCULAR PRODUCTS) IMPLANT
TOWEL GREEN STERILE (TOWEL DISPOSABLE) ×2 IMPLANT
TOWEL GREEN STERILE FF (TOWEL DISPOSABLE) ×2 IMPLANT
TRAP SPECIMEN MUCUS 40CC (MISCELLANEOUS) ×1 IMPLANT
TRAY FOLEY MTR SLVR 16FR STAT (SET/KITS/TRAYS/PACK) ×2 IMPLANT
TROCAR XCEL BLADELESS 5X75MML (TROCAR) ×2 IMPLANT
TROCAR XCEL NON-BLD 11X100MML (ENDOMECHANICALS) ×1 IMPLANT
TROCAR XCEL NON-BLD 5MMX100MML (ENDOMECHANICALS) IMPLANT
TUBING EXTENTION W/L.L. (IV SETS) ×1 IMPLANT
WATER STERILE IRR 1000ML POUR (IV SOLUTION) ×3 IMPLANT

## 2021-04-10 NOTE — Brief Op Note (Addendum)
04/05/2021 - 04/10/2021  1:34 PM  [name]  70 y.o. female  PRE-OPERATIVE DIAGNOSIS:  LEFT HEMOTHORAX  POST-OPERATIVE DIAGNOSIS:  MINIMAL LEFT HEMOTHORAX/ INTRA-ALVEOLAR HEMORRHAGE  PROCEDURE:  Procedure(s): LEFT VIDEO ASSISTED THORACOSCOPY,  EVACUATION OF HEMOTHORAX,  LEFT LOWER LOBE WEDGE RESECTION (Left)  SURGEON:  Surgeon(s) and Role:    * [name], MD - Primary  PHYSICIAN ASSISTANT: Erin Barrett PA-C   ASSISTANTS: none   ANESTHESIA:   general  EBL:  200 mL   BLOOD ADMINISTERED:none  DRAINS:  28 Blake Drain    LOCAL MEDICATIONS USED:  NONE  SPECIMEN:  Source of Specimen:  Left Lower Lobe Wedge  DISPOSITION OF SPECIMEN:  PATHOLOGY  COUNTS:  YES  TOURNIQUET:  * No tourniquets in log *  DICTATION: .Dragon Dictation  PLAN OF CARE:  patient has active inpatient admission  PATIENT DISPOSITION:  PACU - hemodynamically stable.   Delay start of Pharmacological VTE agent (>24hrs) due to surgical blood loss or risk of bleeding: yes

## 2021-04-10 NOTE — Anesthesia Postprocedure Evaluation (Signed)
Anesthesia Post Note  Patient: Sara Coffey  Procedure(s) Performed: VIDEO ASSISTED THORACOSCOPY, EVACUATION OF HEMOTHORAX, LEFT LOWER LOBE WEDGE RESECTION (Left: Chest)     Patient location during evaluation: PACU Anesthesia Type: General Level of consciousness: awake and alert, patient cooperative and oriented Pain management: pain level controlled Vital Signs Assessment: post-procedure vital signs reviewed and stable Respiratory status: spontaneous breathing, nonlabored ventilation, respiratory function stable and patient connected to nasal cannula oxygen Cardiovascular status: blood pressure returned to baseline and stable Postop Assessment: no apparent nausea or vomiting and adequate PO intake Anesthetic complications: no   No notable events documented.  Last Vitals:  Vitals:   04/10/21 0821 04/10/21 1042  BP:  (!) 110/44  Pulse: 100 93  Resp: 18 20  Temp:  37.5 C  SpO2: 96% 98%    Last Pain:  Vitals:   04/10/21 1445  TempSrc:   PainSc: Asleep                 Steffany Schoenfelder,E. Kinsly Hild

## 2021-04-10 NOTE — Transfer of Care (Signed)
Immediate Anesthesia Transfer of Care Note  Patient: Sara Coffey  Procedure(s) Performed: VIDEO ASSISTED THORACOSCOPY, EVACUATION OF HEMOTHORAX, LEFT LOWER LOBE WEDGE RESECTION (Left: Chest)  Patient Location: PACU  Anesthesia Type:General  Level of Consciousness: awake  Airway & Oxygen Therapy: Patient Spontanous Breathing and Patient connected to face mask  Post-op Assessment: Report given to RN and Post -op Vital signs reviewed and stable  Post vital signs: Reviewed and stable  Last Vitals:  Vitals Value Taken Time  BP 123/61 04/10/21 1359  Temp    Pulse 86 04/10/21 1401  Resp 16 04/10/21 1401  SpO2 100 % 04/10/21 1401  Vitals shown include unvalidated device data.  Last Pain:  Vitals:   04/10/21 1042  TempSrc: Oral  PainSc: 0-No pain      Patients Stated Pain Goal: 0 (81/77/11 6579)  Complications: No notable events documented.

## 2021-04-10 NOTE — Plan of Care (Signed)
  Problem: Clinical Measurements: Goal: Diagnostic test results will improve Outcome: Not Progressing Goal: Respiratory complications will improve Outcome: Not Progressing   Problem: Coping: Goal: Level of anxiety will decrease Outcome: Not Progressing

## 2021-04-10 NOTE — Anesthesia Procedure Notes (Signed)
Procedure Name: Intubation Date/Time: 04/10/2021 11:46 AM Performed by: Barrington Ellison, CRNA Pre-anesthesia Checklist: Patient identified, Emergency Drugs available, Suction available and Patient being monitored Patient Re-evaluated:Patient Re-evaluated prior to induction Oxygen Delivery Method: Circle system utilized Preoxygenation: Pre-oxygenation with 100% oxygen Induction Type: IV induction Ventilation: Mask ventilation without difficulty Laryngoscope Size: Mac and 3 Grade View: Grade I Tube type: Oral Endobronchial tube: Double lumen EBT and 37 Fr Number of attempts: 1 Airway Equipment and Method: Stylet Placement Confirmation: ETT inserted through vocal cords under direct vision, positive ETCO2 and breath sounds checked- equal and bilateral Secured at: 29 cm Tube secured with: Tape Dental Injury: Teeth and Oropharynx as per pre-operative assessment  Comments: Intubated by Everlene Other SRNA

## 2021-04-10 NOTE — Op Note (Signed)
NAME: Sara Coffey, Sara Coffey MEDICAL RECORD NO: [id number] ACCOUNT NO: [id number] DATE OF BIRTH: [birth year] FACILITY: [facility] LOCATION: [facility] PHYSICIAN: Revonda Standard. Roxan Hockey, MD  Operative Report   DATE OF PROCEDURE: 04/10/2021  PREOPERATIVE DIAGNOSIS:  Probable left hemothorax.  POSTOPERATIVE DIAGNOSIS:  Minimal left hemothorax and probable intra-alveolar hemorrhage.  PROCEDURES:   Left video-assisted thoracoscopy,  Evacuation of hemothorax,  Wedge resection for visceral pleural tear.  SURGEON:  Revonda Standard. Roxan Hockey, MD  ASSISTANT:  Ellwood Handler, PA  ANESTHESIA:  General.  FINDINGS:  Small amount of clot and fluid in pleural space, distended upper and lower lobes, but predominant upper lobe with likely intra-alveolar hemorrhage.  CLINICAL NOTE:  The patient is a 70 year old woman who has undergone surgical resection for a lung nodule on 04/05/2021.  She developed progressive opacity in her chest and had a drop in her hematocrit. The concern was that she had had an intrapleural  bleed and hemothorax.  She was advised to undergo re-exploration.  The indications, risks, benefits, and alternatives were discussed in detail with the patient.  She understood and accepted the risks and agreed to proceed.  DESCRIPTION OF PROCEDURE:  The patient was brought to the operating room on 04/10/2021.  She had induction of general anesthesia and was intubated with a double-lumen endotracheal tube.  Intravenous antibiotics were administered.  Sequential compression  devices were placed on the calves for DVT prophylaxis.  She was placed in a right lateral decubitus position, and the left chest was prepped and draped in the usual sterile fashion.  Single lung ventilation of the right lung was initiated and was  tolerated well throughout the procedure.  A timeout was performed.  The previous thoracotomy incision was opened.  A small amount of serosanguineous fluid was evacuated.  Direct inspection showed the  lung was relatively distended and firm.  The posterior incision was opened and a port was  placed.  A 10 mm scope was used for visualization and lighting.  There was some clot posteriorly and inferiorly that was evacuated, but there was relatively minimal fluid in the chest.  The upper lobe was over distended and had a very firm texture.  The  lower lobe was difficult to visualize, but the part that could be visualized also had a very firm texture consistent with intra-alveolar hemorrhage. While evacuating clot posteriorly, there was a tear along the edge of the lower lobe, which was stapled  off using an Echelon stapler with black cartridges.  The portion of the excised lung was sent as a specimen for permanent pathology.  The chest was copiously irrigated with warm saline on multiple occasions.  A 28-French Blake drain was placed through  the previous chest tube site and secured with a #1 silk suture.  The 8th and 9th ribs were reapproximated with pericostal suture.  The incision was closed in standard fashion.  All sponge, needle, and instrument counts were correct at the end of the  procedure. The patient was taken from the operating room to the postanesthetic care unit extubated and in good condition.   ROH D: 04/10/2021 5:38:34 pm T: 04/10/2021 11:52:00 pm  JOB: [id number]/ [id number]

## 2021-04-10 NOTE — Progress Notes (Signed)
Pt to OR.

## 2021-04-10 NOTE — Progress Notes (Signed)
      [address]       [place] [address]             [phone]      5 Days Post-Op Procedure(s) (LRB): XI ROBOTIC ASSISTED THORACOSCOPY-LEFT LOWER LOBE SUPERIOR SEGMENTECTOMY (Left) LYMPH NODE DISSECTION (Left) THORACOTOMY MAJOR INTERCOSTAL NERVE BLOCK  Subjective:  Patient had a rough night.  Felt short of breath most of the evening.  Saturations are okay.  + BM  Objective: Vital signs in last 24 hours: Temp:  [98.3 F (36.8 C)-99.6 F (37.6 C)] 99.2 F (37.3 C) (08/24 0700) Pulse Rate:  [99-103] 100 (08/24 0355) Cardiac Rhythm: Sinus tachycardia (08/24 0700) Resp:  [18-27] 27 (08/24 0700) BP: (106-124)/(53-79) 124/79 (08/24 0700) SpO2:  [90 %-95 %] 95 % (08/24 0700)  Intake/Output from previous day: 08/23 0701 - 08/24 0700 In: -  Out: 50 [Chest Tube:50]  General appearance: alert, cooperative, and no distress Heart: regular rate and rhythm Lungs: clear to auscultation bilaterally Abdomen: soft, non-tender; bowel sounds normal; no masses,  no organomegaly Extremities: extremities normal, atraumatic, no cyanosis or edema Wound: clean and dry  Lab Results: Recent Labs    04/08/21 0102 04/09/21 0008  WBC 11.7* 14.9*  HGB 7.8* 7.5*  HCT 22.7* 22.4*  PLT 183 260   BMET:  Recent Labs    04/09/21 0008 04/10/21 0035  NA 133* 131*  K 5.2* 4.0  CL 102 99  CO2 26 25  GLUCOSE 120* 112*  BUN 11 11  CREATININE 0.90 0.79  CALCIUM 8.4* 7.8*    PT/INR: No results for input(s): LABPROT, INR in the last 72 hours. ABG    Component Value Date/Time   PHART 7.414 04/06/2021 0450   HCO3 24.4 04/06/2021 0450   TCO2 23 04/17/2019 1421   ACIDBASEDEF 0.3 04/03/2021 0950   O2SAT 96.2 04/06/2021 0450   CBG (last 3)  No results for input(s): GLUCAP in the last 72 hours.  Assessment/Plan: S/P Procedure(s) (LRB): XI ROBOTIC ASSISTED THORACOSCOPY-LEFT LOWER LOBE SUPERIOR SEGMENTECTOMY (Left) LYMPH NODE DISSECTION (Left) THORACOTOMY  MAJOR INTERCOSTAL NERVE BLOCK  CV- tachycardia, BP stable Pulm- mild tachypnea, CXR with continued pleural effusion/atelectasis.Marland Kitchen will apply oxygen to see if can provide some symptom relief Hyperkalemia- improved Dispo- patient with worsening shortness of breath, Hyperkalemia improved, to OR today for washout of left hemothorax   LOS: 5 days    Ellwood Handler, PA-C 04/10/2021

## 2021-04-10 NOTE — Interval H&P Note (Signed)
History and Physical Interval Note:  04/10/2021 10:55 AM  Sara Coffey  has presented today for surgery, with the diagnosis of LEFT HEMOTHORAX.  The various methods of treatment have been discussed with the patient and family. After consideration of risks, benefits and other options for treatment, the patient has consented to  Procedure(s): VIDEO ASSISTED THORACOSCOPY, EVACUATION OF HEMOTHORAX (Left) as a surgical intervention.  The patient's history has been reviewed, patient examined, no change in status, stable for surgery.  I have reviewed the patient's chart and labs.  Questions were answered to the patient's satisfaction.     [name]

## 2021-04-10 NOTE — Anesthesia Preprocedure Evaluation (Addendum)
Anesthesia Evaluation  Patient identified by MRN, date of birth, ID band Patient awake    Reviewed: Allergy & Precautions, NPO status , Patient's Chart, lab work & pertinent test results  History of Anesthesia Complications Negative for: history of anesthetic complications  Airway Mallampati: I  TM Distance: >3 FB Neck ROM: Full    Dental  (+) Edentulous Upper, Edentulous Lower   Pulmonary COPD, Current Smoker and Patient abstained from smoking.,  L hemothorax s/p lung mass resection  Breath sounds clear, markedly diminished on L   + decreased breath sounds (Left)      Cardiovascular negative cardio ROS   Rhythm:Regular Rate:Normal  '20 ECHO: normal systolic function, EF 23-55%, no significant valvular abnormalities   Neuro/Psych TIA   GI/Hepatic negative GI ROS, Neg liver ROS,   Endo/Other  negative endocrine ROS  Renal/GU negative Renal ROS     Musculoskeletal   Abdominal   Peds  Hematology  (+) Blood dyscrasia (Hb 7.5), anemia ,   Anesthesia Other Findings   Reproductive/Obstetrics                            Anesthesia Physical Anesthesia Plan  ASA: 3  Anesthesia Plan: General   Post-op Pain Management:    Induction: Intravenous  PONV Risk Score and Plan: 2 and Ondansetron and Dexamethasone  Airway Management Planned: Oral ETT and Double Lumen EBT  Additional Equipment: Arterial line  Intra-op Plan:   Post-operative Plan: Extubation in OR  Informed Consent: I have reviewed the patients History and Physical, chart, labs and discussed the procedure including the risks, benefits and alternatives for the proposed anesthesia with the patient or authorized representative who has indicated his/her understanding and acceptance.       Plan Discussed with: CRNA and Surgeon  Anesthesia Plan Comments:         Anesthesia Quick Evaluation

## 2021-04-10 NOTE — Anesthesia Procedure Notes (Signed)
Arterial Line Insertion Start/End8/24/2022 11:20 AM, 04/10/2021 11:25 AM Performed by: Annye Asa, MD, Barrington Ellison, CRNA  Patient location: Pre-op. Preanesthetic checklist: patient identified, IV checked, site marked, risks and benefits discussed, surgical consent, monitors and equipment checked, pre-op evaluation, timeout performed and anesthesia consent Lidocaine 1% used for infiltration radial was placed Catheter size: 20 G Hand hygiene performed  and maximum sterile barriers used   Procedure performed without using ultrasound guided technique. Following insertion, Biopatch and dressing applied. Post procedure assessment: unchanged  Patient tolerated the procedure well with no immediate complications.

## 2021-04-11 ENCOUNTER — Inpatient Hospital Stay (HOSPITAL_COMMUNITY): Payer: Medicare HMO

## 2021-04-11 ENCOUNTER — Encounter (HOSPITAL_COMMUNITY): Payer: Self-pay | Admitting: Thoracic Surgery (Cardiothoracic Vascular Surgery)

## 2021-04-11 LAB — CBC
HCT: 19.1 % — ABNORMAL LOW (ref 36.0–46.0)
HCT: 24.3 % — ABNORMAL LOW (ref 36.0–46.0)
Hemoglobin: 6.5 g/dL — CL (ref 12.0–15.0)
Hemoglobin: 8.4 g/dL — ABNORMAL LOW (ref 12.0–15.0)
MCH: 31.6 pg (ref 26.0–34.0)
MCH: 30.5 pg (ref 26.0–34.0)
MCHC: 34 g/dL (ref 30.0–36.0)
MCHC: 34.6 g/dL (ref 30.0–36.0)
MCV: 92.7 fL (ref 80.0–100.0)
MCV: 88.4 fL (ref 80.0–100.0)
Platelets: 357 10*3/uL (ref 150–400)
Platelets: 381 10*3/uL (ref 150–400)
RBC: 2.06 MIL/uL — ABNORMAL LOW (ref 3.87–5.11)
RBC: 2.75 MIL/uL — ABNORMAL LOW (ref 3.87–5.11)
RDW: 13.1 % (ref 11.5–15.5)
RDW: 15.7 % — ABNORMAL HIGH (ref 11.5–15.5)
WBC: 9.9 10*3/uL (ref 4.0–10.5)
WBC: 11.6 10*3/uL — ABNORMAL HIGH (ref 4.0–10.5)
nRBC: 0 % (ref 0.0–0.2)
nRBC: 0 % (ref 0.0–0.2)

## 2021-04-11 LAB — BASIC METABOLIC PANEL
Anion gap: 7 (ref 5–15)
BUN: 12 mg/dL (ref 8–23)
CO2: 26 mmol/L (ref 22–32)
Calcium: 7.9 mg/dL — ABNORMAL LOW (ref 8.9–10.3)
Chloride: 99 mmol/L (ref 98–111)
Creatinine, Ser: 0.87 mg/dL (ref 0.44–1.00)
GFR, Estimated: >60 mL/min (ref >60)
Glucose, Bld: 149 mg/dL — ABNORMAL HIGH (ref 70–99)
Potassium: 4.7 mmol/L (ref 3.5–5.1)
Sodium: 132 mmol/L — ABNORMAL LOW (ref 135–145)

## 2021-04-11 LAB — PREPARE RBC (CROSSMATCH)

## 2021-04-11 LAB — SURGICAL PATHOLOGY

## 2021-04-11 MED ORDER — SODIUM CHLORIDE 0.9% IV SOLUTION: Freq: Once | INTRAVENOUS | Status: DC

## 2021-04-11 MED ORDER — PANTOPRAZOLE SODIUM 40 MG PO TBEC
40.0000 mg | DELAYED_RELEASE_TABLET | Freq: Every day | ORAL | Status: DC
  Administered 2021-04-11 – 2021-04-13 (×3): 40 mg via ORAL
  Filled 2021-04-11 (×3): qty 1

## 2021-04-11 NOTE — Progress Notes (Signed)
      [address]       [place] [address]             [phone]         Repeat Hgb level is up to 8.4 after transfusion.  Will continue to monitor the patient closely.  Ellwood Handler, PA-C

## 2021-04-11 NOTE — Plan of Care (Signed)
  Problem: Health Behavior/Discharge Planning: Goal: Ability to manage health-related needs will improve Outcome: Progressing   Problem: Clinical Measurements: Goal: Ability to maintain clinical measurements within normal limits will improve Outcome: Progressing Goal: Will remain free from infection Outcome: Progressing   Problem: Pain Managment: Goal: General experience of comfort will improve Outcome: Progressing   Problem: Safety: Goal: Ability to remain free from injury will improve Outcome: Progressing

## 2021-04-11 NOTE — Progress Notes (Addendum)
      [address]       [place] [address]             [phone]      1 Day Post-Op Procedure(s) (LRB): VIDEO ASSISTED THORACOSCOPY, EVACUATION OF HEMOTHORAX, LEFT LOWER LOBE WEDGE RESECTION (Left)  Subjective: Overall she is feeling better.  She remains short of breath, but it isn't as bad as it was.  She denies blood loss from previous bowel movements.  She denies hemoptysis  Objective: Vital signs in last 24 hours: Temp:  [97.9 F (36.6 C)-99.5 F (37.5 C)] 98 F (36.7 C) (08/25 0606) Pulse Rate:  [75-100] 79 (08/25 0606) Cardiac Rhythm: Normal sinus rhythm (08/25 0703) Resp:  [12-20] 15 (08/25 0606) BP: (94-125)/(44-75) 105/54 (08/25 0606) SpO2:  [96 %-100 %] 98 % (08/25 0606) Arterial Line BP: (143)/(67) 143/67 (08/24 1445)  Intake/Output from previous day: 08/24 0701 - 08/25 0700 In: 1715 [I.V.:1200; Blood:315; IV Piggyback:200] Out: 2195 [Urine:1575; Blood:200; Chest Tube:420]  General appearance: alert, cooperative, and no distress Heart: regular rate and rhythm Lungs: diminished breath sounds on left Abdomen: soft, non-tender; bowel sounds normal; no masses,  no organomegaly Extremities: extremities normal, atraumatic, no cyanosis or edema Wound: clean and dry  Lab Results: Recent Labs    04/09/21 0008 04/11/21 0027  WBC 14.9* 9.9  HGB 7.5* 6.5*  HCT 22.4* 19.1*  PLT 260 357   BMET:  Recent Labs    04/10/21 0035 04/11/21 0027  NA 131* 132*  K 4.0 4.7  CL 99 99  CO2 25 26  GLUCOSE 112* 149*  BUN 11 12  CREATININE 0.79 0.87  CALCIUM 7.8* 7.9*    PT/INR: No results for input(s): LABPROT, INR in the last 72 hours. ABG    Component Value Date/Time   PHART 7.414 04/06/2021 0450   HCO3 24.4 04/06/2021 0450   TCO2 23 04/17/2019 1421   ACIDBASEDEF 0.3 04/03/2021 0950   O2SAT 96.2 04/06/2021 0450   CBG (last 3)  No results for input(s): GLUCAP in the last 72 hours.  Assessment/Plan: S/P Procedure(s) (LRB): VIDEO ASSISTED  THORACOSCOPY, EVACUATION OF HEMOTHORAX, LEFT LOWER LOBE WEDGE RESECTION (Left)  CV- NSR, BP stable Pulm- CT is w/o air leak, 420 cc output from chest tube, overall CXR shows improvement, there is a trace apical pneumothorax on left... put chest tube to waterseal as discussed Post operative blood loss anemia, hgb down to 6.5, received a unit overnight, will repeat CBC GI- no obvious signs of GI bleeding, patient states her stool has been normal, denies hemoptysis.Marland Kitchen will check occult to ensure no underlying GI bleeding Dispo - patient stable, received a unit of blood overnight, recheck CBC, will place chest tube to water seal as discussed with Dr. Roxan Hockey, occult cards for BM, continue SCDs for DVT prophylaxis, no Lovenox   LOS: 6 days    Ellwood Handler, PA-C 04/11/2021 Patient seen and examined, agree with above Anemia due to acute blood loss from surgery in setting of anemia- transfused overnight CT output down, no air leak- will place to water seal Path- adenocarcinoma T1a,N0 stage IA SCD + ambulation for DVt prophylaxis- no enoxaparin  Remo Lipps C. Roxan Hockey, MD [facility] [phone]

## 2021-04-12 ENCOUNTER — Inpatient Hospital Stay (HOSPITAL_COMMUNITY): Payer: Medicare HMO

## 2021-04-12 LAB — TYPE AND SCREEN
ABO/RH(D): O NEG
Antibody Screen: NEGATIVE
Unit division: 0

## 2021-04-12 LAB — BPAM RBC
Blood Product Expiration Date: 202208292359
ISSUE DATE / TIME: 202208250218
Unit Type and Rh: 9500

## 2021-04-12 MED ORDER — LEVALBUTEROL HCL 0.63 MG/3ML IN NEBU: 0.6300 mg | INHALATION_SOLUTION | RESPIRATORY_TRACT | Status: DC | PRN

## 2021-04-12 MED ORDER — FUROSEMIDE 40 MG PO TABS
40.0000 mg | ORAL_TABLET | Freq: Once | ORAL | Status: AC
  Administered 2021-04-12: 40 mg via ORAL
  Filled 2021-04-12: qty 1

## 2021-04-12 MED ORDER — ACETAMINOPHEN 500 MG PO TABS
1000.0000 mg | ORAL_TABLET | Freq: Four times a day (QID) | ORAL | Status: DC | PRN
  Administered 2021-04-12 – 2021-04-13 (×3): 1000 mg via ORAL
  Filled 2021-04-12 (×3): qty 2

## 2021-04-12 NOTE — Progress Notes (Addendum)
      [address]       [place] [address]             [phone]      2 Days Post-Op Procedure(s) (LRB): VIDEO ASSISTED THORACOSCOPY, EVACUATION OF HEMOTHORAX, LEFT LOWER LOBE WEDGE RESECTION (Left)  Subjective: Patient up eating breakfast, continues to feel better.  She is dropping her sats at times.  + BM  Objective: Vital signs in last 24 hours: Temp:  [97.6 F (36.4 C)-98.3 F (36.8 C)] 98.2 F (36.8 C) (08/26 0334) Pulse Rate:  [79-97] 87 (08/26 0334) Cardiac Rhythm: Normal sinus rhythm (08/26 0706) Resp:  [13-20] 20 (08/26 0334) BP: (109-121)/(56-69) 120/67 (08/26 0334) SpO2:  [96 %-100 %] 98 % (08/26 0334)  Intake/Output from previous day: 08/25 0701 - 08/26 0700 In: -  Out: 1501 [Urine:1300; Stool:1; Chest Tube:200]  General appearance: alert, cooperative, and no distress Heart: regular rate and rhythm Lungs: clear to auscultation bilaterally Abdomen: soft, non-tender; bowel sounds normal; no masses,  no organomegaly Extremities: extremities normal, atraumatic, no cyanosis or edema Wound: clean and dry  Lab Results: Recent Labs    04/11/21 0027 04/11/21 0802  WBC 9.9 11.6*  HGB 6.5* 8.4*  HCT 19.1* 24.3*  PLT 357 381   BMET:  Recent Labs    04/10/21 0035 04/11/21 0027  NA 131* 132*  K 4.0 4.7  CL 99 99  CO2 25 26  GLUCOSE 112* 149*  BUN 11 12  CREATININE 0.79 0.87  CALCIUM 7.8* 7.9*    PT/INR: No results for input(s): LABPROT, INR in the last 72 hours. ABG    Component Value Date/Time   PHART 7.414 04/06/2021 0450   HCO3 24.4 04/06/2021 0450   TCO2 23 04/17/2019 1421   ACIDBASEDEF 0.3 04/03/2021 0950   O2SAT 96.2 04/06/2021 0450   CBG (last 3)  No results for input(s): GLUCAP in the last 72 hours.  Assessment/Plan: S/P Procedure(s) (LRB): VIDEO ASSISTED THORACOSCOPY, EVACUATION OF HEMOTHORAX, LEFT LOWER LOBE WEDGE RESECTION (Left)  CV-NSR, BP is normal Pulm- no air leak on water seal, 200 cc output from chest  tube, CXR with increase in atelectasis, air space disease and stable pneumothorax... possibly d/c chest tube today, will defer to Dr. Roxan Hockey Blood Loss anemia- Hgb up to 8.4 yesterday after transfusion, will check CBC in AM Continue SCDs for DVT prophylaxis Dispo- patient stable, no air leak from chest tube possibly remove today, repeat CBC in AM   LOS: 7 days    Ellwood Handler, PA-C 04/12/2021 Patient seen and examined, agree with above No air leak and drainage down- dc chest tube Hgb improved post transfusion- recheck tomorrow Possibly home tomorrow, may need home O2 short term  Remo Lipps C. Roxan Hockey, MD [facility] [phone]

## 2021-04-13 ENCOUNTER — Inpatient Hospital Stay (HOSPITAL_COMMUNITY): Payer: Medicare HMO

## 2021-04-13 DIAGNOSIS — J90 Pleural effusion, not elsewhere classified: Secondary | ICD-10-CM | POA: Diagnosis not present

## 2021-04-13 LAB — CBC
HCT: 22.9 % — ABNORMAL LOW (ref 36.0–46.0)
Hemoglobin: 8 g/dL — ABNORMAL LOW (ref 12.0–15.0)
MCH: 30.8 pg (ref 26.0–34.0)
MCHC: 34.9 g/dL (ref 30.0–36.0)
MCV: 88.1 fL (ref 80.0–100.0)
Platelets: 439 10*3/uL — ABNORMAL HIGH (ref 150–400)
RBC: 2.6 MIL/uL — ABNORMAL LOW (ref 3.87–5.11)
RDW: 14.9 % (ref 11.5–15.5)
WBC: 8.7 10*3/uL (ref 4.0–10.5)
nRBC: 0 % (ref 0.0–0.2)

## 2021-04-13 MED ORDER — OXYCODONE HCL 5 MG PO TABS: 5.0000 mg | ORAL_TABLET | Freq: Four times a day (QID) | ORAL | 0 refills | Status: AC | PRN

## 2021-04-13 MED ORDER — ACETAMINOPHEN 500 MG PO TABS: 1000.0000 mg | ORAL_TABLET | Freq: Four times a day (QID) | ORAL | 0 refills | Status: AC | PRN

## 2021-04-13 NOTE — Progress Notes (Signed)
[address]       [place] [address]             [phone]      3 Days Post-Op Procedure(s) (LRB): VIDEO ASSISTED THORACOSCOPY, EVACUATION OF HEMOTHORAX, LEFT LOWER LOBE WEDGE RESECTION (Left) Subjective: Feels pretty well overall, has some incisional discomfort  Objective: Vital signs in last 24 hours: Temp:  [98.1 F (36.7 C)-98.9 F (37.2 C)] 98.5 F (36.9 C) (08/27 0353) Pulse Rate:  [90-104] 90 (08/27 0353) Cardiac Rhythm: Normal sinus rhythm (08/27 0700) Resp:  [18-20] 20 (08/27 0353) BP: (106-120)/(53-78) 106/54 (08/27 0353) SpO2:  [96 %-98 %] 96 % (08/27 0353)  Hemodynamic parameters for last 24 hours:    Intake/Output from previous day: 08/26 0701 - 08/27 0700 In: 840 [P.O.:840] Out: 3600 [Urine:3600] Intake/Output this shift: No intake/output data recorded.  General appearance: alert, cooperative, and no distress Heart: regular rate and rhythm Lungs: clear to auscultation bilaterally Abdomen: benign Extremities: minor edema Wound: incis healing well  Lab Results: Recent Labs    04/11/21 0802 04/13/21 0053  WBC 11.6* 8.7  HGB 8.4* 8.0*  HCT 24.3* 22.9*  PLT 381 439*   BMET:  Recent Labs    04/11/21 0027  NA 132*  K 4.7  CL 99  CO2 26  GLUCOSE 149*  BUN 12  CREATININE 0.87  CALCIUM 7.9*    PT/INR: No results for input(s): LABPROT, INR in the last 72 hours. ABG    Component Value Date/Time   PHART 7.414 04/06/2021 0450   HCO3 24.4 04/06/2021 0450   TCO2 23 04/17/2019 1421   ACIDBASEDEF 0.3 04/03/2021 0950   O2SAT 96.2 04/06/2021 0450   CBG (last 3)  No results for input(s): GLUCAP in the last 72 hours.  Meds Scheduled Meds:  sodium chloride   Intravenous Once   sodium chloride   Intravenous Once   bisacodyl  10 mg Oral Daily   calcium-vitamin D  1 tablet Oral QODAY   Chlorhexidine Gluconate Cloth  6 each Topical Daily   ferrous sulfate  325 mg Oral QODAY   multivitamin with minerals  1 tablet Oral  Daily   pantoprazole  40 mg Oral Q1200   rosuvastatin  5 mg Oral Daily   senna-docusate  1 tablet Oral QHS   Continuous Infusions: PRN Meds:.acetaminophen, calcium carbonate, ketorolac, lactulose, levalbuterol, melatonin, ondansetron (ZOFRAN) IV, oxyCODONE  Xrays DG Chest Port 1 View  Result Date: 04/12/2021 CLINICAL DATA:  Status post left chest tube removal. EXAM: PORTABLE CHEST 1 VIEW COMPARISON:  04/12/2021 at 6:26 a.m. FINDINGS: The cardiomediastinal silhouette is unchanged. Postsurgical changes are again noted in the left lung with interval removal of the left-sided chest tube. No pneumothorax is identified. Airspace opacities in the left mid and lower lung have not significantly changed. No sizable pleural effusion is identified. IMPRESSION: 1. Interval removal of left chest tube. No pneumothorax. 2. Unchanged left lung airspace disease. Electronically Signed   By: Logan Bores M.D.   On: 04/12/2021 14:51   DG CHEST PORT 1 VIEW  Result Date: 04/12/2021 CLINICAL DATA:  Pneumothorax on left.  Chest tube present. EXAM: PORTABLE CHEST 1 VIEW COMPARISON:  04/11/2021 FINDINGS: Left chest tube is stable in position. There appears to be a small amount of pleural air along the lateral aspect of the mid and lower left chest. Slightly increased densities in the mid and lower left lung. Right lung remains clear. Cardiomediastinal silhouette is minimally changed. Postsurgical changes  in left lung. IMPRESSION: 1. Stable position of the left chest tube. Suspect a small amount of left pleural air. This pleural air may be loculated. 2. Increased densities in the mid and lower left lung could represent worsening parenchymal lung disease. Electronically Signed   By: Markus Daft M.D.   On: 04/12/2021 08:54    Assessment/Plan: S/P Procedure(s) (LRB): VIDEO ASSISTED THORACOSCOPY, EVACUATION OF HEMOTHORAX, LEFT LOWER LOBE WEDGE RESECTION (Left)   1 afeb, VSS, feels pretty well overall, some incis pain 2 sats  ok on RA 3 excellent UOP 4 CXR appears to have small apical left pntx, ASD fairly stable in appearance 5 no leukocytosis 6 H/H 24.3/8.4 yesterday--- today is 22.9/8.0, she is iron 7 she appears stable for d/c , staying with daughter who is a nurse  LOS: 8 days    John Giovanni PA-C Pager [phone] 04/13/2021

## 2021-04-13 NOTE — Care Management (Addendum)
Awaiting ambulatory saturations to be documented for oxygen to be ordered. Secure chat sent to nurse requesting documentation.   Addendum- patient does not qualify for home oxygen.

## 2021-04-13 NOTE — Progress Notes (Signed)
SATURATION QUALIFICATIONS: (This note is used to comply with regulatory documentation for home oxygen)  Patient Saturations on Room Air at Rest = 96%  Patient Saturations on Room Air while Ambulating = 92%  Patient Saturations on 0 Liters of oxygen while Ambulating = 92%  Please briefly explain why patient needs home oxygen: Pt maintained her sats above 90% without o2 supplement.

## 2021-04-15 ENCOUNTER — Telehealth: Payer: Self-pay

## 2021-04-15 NOTE — Telephone Encounter (Signed)
Patient contacted the office with concerns for possible UTI. She stated that it started a couple days ago and she has great urgency. She is s/p VATS which subsequently went back a few days later for second surgery. She belives that the catheter may have caused the UTI. Advised that she would need to contact her PCP for follow-up, urine sample, and antibiotics. She acknowledged receipt.

## 2021-04-16 DIAGNOSIS — N3001 Acute cystitis with hematuria: Secondary | ICD-10-CM | POA: Diagnosis not present

## 2021-04-16 DIAGNOSIS — R309 Painful micturition, unspecified: Secondary | ICD-10-CM | POA: Diagnosis not present

## 2021-04-17 ENCOUNTER — Ambulatory Visit: Payer: Medicare HMO | Admitting: Nurse Practitioner

## 2021-04-18 ENCOUNTER — Other Ambulatory Visit: Payer: Self-pay | Admitting: *Deleted

## 2021-04-18 NOTE — Progress Notes (Signed)
The proposed treatment discussed in cancer conference is for discussion purpose only and is not a binding recommendation. The patient was not physically examined nor present for their treatment options. Therefore, final treatment plans cannot be decided.  ?

## 2021-04-23 ENCOUNTER — Other Ambulatory Visit: Payer: Self-pay | Admitting: Thoracic Surgery (Cardiothoracic Vascular Surgery)

## 2021-04-23 ENCOUNTER — Other Ambulatory Visit: Payer: Self-pay

## 2021-04-23 ENCOUNTER — Other Ambulatory Visit: Payer: Self-pay | Admitting: Nurse Practitioner

## 2021-04-23 ENCOUNTER — Encounter: Payer: Self-pay | Admitting: Thoracic Surgery (Cardiothoracic Vascular Surgery)

## 2021-04-23 ENCOUNTER — Ambulatory Visit (INDEPENDENT_AMBULATORY_CARE_PROVIDER_SITE_OTHER): Payer: Self-pay | Admitting: Thoracic Surgery (Cardiothoracic Vascular Surgery)

## 2021-04-23 ENCOUNTER — Ambulatory Visit
Admission: RE | Admit: 2021-04-23 | Discharge: 2021-04-23 | Disposition: A | Discharge status: Home / Self Care | Payer: Medicare HMO | Source: Ambulatory Visit | Attending: Thoracic Surgery (Cardiothoracic Vascular Surgery) | Admitting: Thoracic Surgery (Cardiothoracic Vascular Surgery)

## 2021-04-23 VITALS — BP 113/57 | HR 84 | Resp 20 | Ht 64.0 in | Wt 137.0 lb

## 2021-04-23 DIAGNOSIS — R911 Solitary pulmonary nodule: Secondary | ICD-10-CM

## 2021-04-23 DIAGNOSIS — J181 Lobar pneumonia, unspecified organism: Secondary | ICD-10-CM | POA: Diagnosis not present

## 2021-04-23 DIAGNOSIS — E785 Hyperlipidemia, unspecified: Secondary | ICD-10-CM

## 2021-04-23 MED ORDER — SULFAMETHOXAZOLE-TRIMETHOPRIM 800-160 MG PO TABS: 1.0000 | ORAL_TABLET | Freq: Two times a day (BID) | ORAL | 0 refills | Status: DC

## 2021-04-23 MED ORDER — PREGABALIN 25 MG PO CAPS: ORAL_CAPSULE | ORAL | 0 refills | Status: DC

## 2021-04-23 MED ORDER — OXYCODONE HCL 5 MG PO TABS: 5.0000 mg | ORAL_TABLET | Freq: Four times a day (QID) | ORAL | 0 refills | Status: DC | PRN

## 2021-04-23 NOTE — Progress Notes (Signed)
[address]       [place] [address]             [phone]       HPI: [name] returns for a scheduled postoperative follow-up visit  Sara Coffey is a 70 year old woman with a history of tobacco abuse, hyperlipidemia, osteoporosis, syncope with transient global amnesia, possible TIA, and colon cancer.  In September 2021 she was noted to have a groundglass opacity in the superior segment of the left lower lobe.  A follow-up CT this summer showed the nodule was about 8 mm in diameter and appeared to have a small solid component.  She underwent a robotic left VATS for the nodule on 04/05/2021.  Initial plan was a superior segmentectomy, but she had a completely undeveloped fissure.  A wedge resection was performed we were sure we were able to get the nodule.  Therefore we went ahead and did a complicated superior segmentectomy.  It turned out the nodule was in the original specimen but we did know that until days later we will get the final pathology back.  Postoperatively she developed progressive opacity in the left chest on her chest x-rays.  She went back to the operating room for reexploration looking for hemothorax.  There was a little bit of blood and clot in the chest but primarily it was determined that she had extensive intra-alveolar hemorrhage.  She was discharged home on 04/13/2021.  She says her breathing has improved significantly.  She is not using oxygen.  She complains of pain primarily in the left costal margin region.  She says this is a sharp pain.  She is been taking Tylenol.  She ran out of oxycodone.  She also complains of burning with urination.  She was given a prescription for nitrofurantoin at [facility] but that did not help.   Past Medical History:  Diagnosis Date   Amnesia 04/17/2019   Hemorrhoid 09/28/2013   Hyperlipidemia    Osteoporosis    Syncope and collapse    TGA (transient global amnesia) 04/18/2019   TIA (transient ischemic attack)  04/17/2019   Tobacco abuse     Current Outpatient Medications  Medication Sig Dispense Refill   acetaminophen (TYLENOL) 500 MG tablet Take 2 tablets (1,000 mg total) by mouth every 6 (six) hours as needed for mild pain, fever or headache. 30 tablet 0   Calcium Carb-Cholecalciferol (CALCIUM-VITAMIN D) 600-400 MG-UNIT TABS Take 1 tablet by mouth every other day.     calcium carbonate (TUMS - DOSED IN MG ELEMENTAL CALCIUM) 500 MG chewable tablet Chew 1 tablet by mouth as needed for indigestion or heartburn.     Ferrous Sulfate (IRON) 325 (65 Fe) MG TABS Take 325 mg by mouth every other day. In the morning     Melatonin 10 MG TABS Take 10 mg by mouth at bedtime as needed (sleep).     Multiple Vitamins-Minerals (MULTIVITAMIN WITH MINERALS) tablet Take 1 tablet by mouth daily. Multivitamin for Women 50+     Omega-3 Fatty Acids (FISH OIL) 1000 MG CAPS Take 2,000 mg by mouth in the morning.     rosuvastatin (CRESTOR) 5 MG tablet Take 1 tablet (5 mg total) by mouth daily. 90 tablet 1   vitamin B-12 (CYANOCOBALAMIN) 1000 MCG tablet Take 2,000 mcg by mouth daily.     No current facility-administered medications for this visit.    Physical Exam BP (!) 113/57   Pulse 84   Resp 20  Ht 5\' 4"  (1.626 m)   Wt 137 lb (62.1 kg)   SpO2 96% Comment: RA  BMI 23.102 kg/m  70 year old woman in no acute distress Alert and oriented x3 with no focal deficits Lungs slightly diminished on the left but otherwise clear Cardiac regular rate and rhythm Incision some redness and irritation at chest tube site, otherwise intact No peripheral edema  Diagnostic Tests: CHEST - 2 VIEW   COMPARISON:  04/13/2021   FINDINGS: Midline trachea. Normal heart size. No pleural effusion or pneumothorax. Left lower lobe consolidation is felt to be slightly increased, especially on the lateral view. Clear right lung.   IMPRESSION: Left lower lobe consolidation, felt to be slightly progressive since 04/13/2021. Favor  infection.     Electronically Signed   By: Abigail Miyamoto M.D.   On: 04/23/2021 16:22   I personally reviewed the chest x-ray images.  There is consolidation of the left lung consistent with the intra-alveolar hemorrhage.  Impression: Sara Coffey is a 70 year old woman with a history of tobacco abuse, hyperlipidemia, osteoporosis, syncope with transient global amnesia, possible TIA, and colon cancer.  She was first found to have a lung nodule about a year ago.  On follow-up CT had increased in size slightly but also might of greater concern was possibility of a solid component.  She underwent a robotic left lower lobe superior segmentectomy which required conversion to minithoracotomy.  Nodule turned out to be a stage Ia adenocarcinoma (lung primary).  Postoperative course was complicated by intra-alveolar hemorrhage.  She was taken back to the operating room for reexploration for hemothorax.  Currently she is doing well from a respiratory standpoint.  She is having a lot of intercostal neuralgia pain.  She has been taking more than the recommended dose of Tylenol for that pain and she has run out of oxycodone.  I am going to refill her oxycodone prescription.  5 mg tablets, 1 p.o. every 6 hours as needed, 30 tablets, no refills.  If she needs additional refills she can call the office and we will provide that.  In addition I am going to start her on Lyrica 25 mg daily for 3 days and then 25 mg twice daily.  She understands Lyrica is a medication that she should take on a scheduled basis rather than as needed and it takes time to have effect.  I emphasized her the importance of not exceeding 4000 mg of Tylenol in a day.  She should not use more than 8 extra strength Tylenol is a 24-hour.  Dysuria-probable UTI.  Will give Bactrim DS 1 tablet p.o. twice daily for 7 days.  Plan:  Lyrica 25 mg p.o. twice daily (Toprol 25 mg daily x3 days) Refill oxycodone 5 mg p.o. every 6 hours as needed, 30  tablets, no refills Do not exceed 4000 mg of Tylenol (8 extra strength Tylenol) a day Bactrim DS 1 tablet p.o. twice daily for 7 days for UTI Return in 1 month with PA and lateral chest x-ray  [name], MD [facility] [phone]

## 2021-04-24 NOTE — Progress Notes (Signed)
Subjective:    Patient ID: Sara Coffey, female    DOB: 1950/11/30, 70 y.o.   MRN: [id number]  HPI: Sara Coffey is a 70 y.o. female presenting for urinary tract infection symptoms.  Chief Complaint  Patient presents with   Urinary Tract Infection    URINARY SYMPTOMS About 2.5 weeks ago, had lobectomy for small area of lung cancer.  She had a catheter while in the hospital and her urinary symptoms started shortly after discharge.  [facility]  Her symptoms got a little bit better, but then returned in the past week or so. Duration: weeks Dysuria: no Urinary frequency: yes; worse at night Urgency: no Small volume voids: yes Symptom severity: no Urinary incontinence: no Foul odor: no Hematuria: no Abdominal pain: no Back pain: no Suprapubic pain/pressure: yes Flank pain: no Fever:  no Nausea: no Vomiting: no Relief with cranberry juice: no Relief with pyridium: yes Status: stable Previous urinary tract infection: yes Recurrent urinary tract infection: no Sexual activity: No sexually active Vaginal discharge: no Treatments attempted: antibiotics, pyridium, cranberry, and increasing fluids   Patient is also requesting refill of rosuvastatin 5 mg daily today.  She is planning to schedule a wellness visit in the next couple of months and would like her labs checked then.  No Known Allergies  Outpatient Encounter Medications as of 04/25/2021  Medication Sig   acetaminophen (TYLENOL) 500 MG tablet Take 2 tablets (1,000 mg total) by mouth every 6 (six) hours as needed for mild pain, fever or headache.   Calcium Carb-Cholecalciferol (CALCIUM-VITAMIN D) 600-400 MG-UNIT TABS Take 1 tablet by mouth every other day.   calcium carbonate (TUMS - DOSED IN MG ELEMENTAL CALCIUM) 500 MG chewable tablet Chew 1 tablet by mouth as needed for indigestion or heartburn.   Ferrous Sulfate (IRON) 325 (65 Fe) MG TABS Take 325 mg by mouth every  other day. In the morning   Melatonin 10 MG TABS Take 10 mg by mouth at bedtime as needed (sleep).   Multiple Vitamins-Minerals (MULTIVITAMIN WITH MINERALS) tablet Take 1 tablet by mouth daily. Multivitamin for Women 50+   Omega-3 Fatty Acids (FISH OIL) 1000 MG CAPS Take 2,000 mg by mouth in the morning.   oxyCODONE (OXY IR/ROXICODONE) 5 MG immediate release tablet Take 1 tablet (5 mg total) by mouth every 6 (six) hours as needed for severe pain.   pregabalin (LYRICA) 25 MG capsule Take 1 capsule (25 mg total) by mouth daily for 3 days, THEN 1 capsule (25 mg total) 2 (two) times daily.   rosuvastatin (CRESTOR) 5 MG tablet Take 1 tablet (5 mg total) by mouth daily.   sulfamethoxazole-trimethoprim (BACTRIM DS) 800-160 MG tablet Take 1 tablet by mouth 2 (two) times daily for 7 days.   vitamin B-12 (CYANOCOBALAMIN) 1000 MCG tablet Take 2,000 mcg by mouth daily.   [DISCONTINUED] rosuvastatin (CRESTOR) 5 MG tablet Take 1 tablet (5 mg total) by mouth daily.   [DISCONTINUED] sulfamethoxazole-trimethoprim (BACTRIM DS) 800-160 MG tablet Take 1 tablet by mouth 2 (two) times daily for 7 days.   No facility-administered encounter medications on file as of 04/25/2021.    Patient Active Problem List   Diagnosis Date Noted   Lung nodule 04/08/2021   S/P partial lobectomy of lung 04/05/2021   Postoperative anemia due to acute blood loss 04/12/2020   Cancer of ascending colon ([place]) 04/09/2020   Malignant neoplasm of ascending colon ([place]) 03/22/2020   Osteoporosis  Encounter for gynecological examination with Papanicolaou smear of cervix 10/04/2019   Routine cervical smear 10/04/2019   Screening for colorectal cancer 10/04/2019   H/O: osteoarthritis 09/19/2019   H/O squamous cell carcinoma of skin 09/19/2019   Heart murmur 04/17/2019   Hyperlipidemia 11/09/2013   IFG (impaired fasting glucose) 11/09/2013   Tobacco abuse 09/28/2013    Past Medical History:  Diagnosis Date   Amnesia 04/17/2019    Hemorrhoid 09/28/2013   Hyperlipidemia    Osteoporosis    Syncope and collapse    TGA (transient global amnesia) 04/18/2019   TIA (transient ischemic attack) 04/17/2019   Tobacco abuse     Relevant past medical, surgical, family and social history reviewed and updated as indicated. Interim medical history since our last visit reviewed.  Review of Systems Per HPI unless specifically indicated above     Objective:    BP (!) 112/58 (BP Location: Left Arm, Patient Position: Sitting)   Pulse 95   Temp 98.2 F (36.8 C) (Oral)   Ht 5\' 4"  (1.626 m)   Wt 135 lb 9.6 oz (61.5 kg)   SpO2 95%   BMI 23.28 kg/m   Wt Readings from Last 3 Encounters:  04/25/21 135 lb 9.6 oz (61.5 kg)  04/23/21 137 lb (62.1 kg)  04/05/21 138 lb (62.6 kg)    Physical Exam Vitals and nursing note reviewed.  Constitutional:      General: She is not in acute distress.    Appearance: Normal appearance. She is not toxic-appearing.  HENT:     Head: Normocephalic and atraumatic.  Abdominal:     General: Abdomen is flat. Bowel sounds are normal. There is no distension.     Palpations: Abdomen is soft.     Tenderness: There is no right CVA tenderness or left CVA tenderness.  Skin:    General: Skin is warm and dry.     Capillary Refill: Capillary refill takes less than 2 seconds.     Coloration: Skin is not jaundiced or pale.     Findings: No erythema.  Neurological:     Mental Status: She is alert and oriented to person, place, and time.     Motor: No weakness.     Gait: Gait normal.  Psychiatric:        Mood and Affect: Mood normal.        Behavior: Behavior normal.        Thought Content: Thought content normal.        Judgment: Judgment normal.      Assessment & Plan:  1. UTI symptoms Acute. Suspect complicated UTI given catheter use while in hospital.  UA today shows 1+ blood, 0-3 RBC under microscope.  No s/s sepsis or pyelonephritis today.  Will send urine for culture and in meantime, treat with  Bactrim DS twice daily for 7 days.  Follow up pending culture and sensitivity results.   - Urine Culture - Urinalysis, Routine w reflex microscopic - sulfamethoxazole-trimethoprim (BACTRIM DS) 800-160 MG tablet; Take 1 tablet by mouth 2 (two) times daily for 7 days.  Dispense: 14 tablet; Refill: 0  2. Hyperlipidemia, unspecified hyperlipidemia type Chronic.  Previously tolerated rosuvastatin 5 mg daily.  Due for labs, however not fasting today and also due for wellness visit.  Will refill and have patient schedule wellness visit with fasting labs in the next month or so.   - rosuvastatin (CRESTOR) 5 MG tablet; Take 1 tablet (5 mg total) by mouth daily.  Dispense: 90 tablet;  Refill: 1     Follow up plan: Return in about 1 month (around 05/25/2021) for AWV with fasting labs.

## 2021-04-25 ENCOUNTER — Ambulatory Visit (INDEPENDENT_AMBULATORY_CARE_PROVIDER_SITE_OTHER): Payer: Medicare HMO | Admitting: Nurse Practitioner

## 2021-04-25 ENCOUNTER — Encounter: Payer: Self-pay | Admitting: Nurse Practitioner

## 2021-04-25 ENCOUNTER — Other Ambulatory Visit: Payer: Self-pay

## 2021-04-25 VITALS — BP 112/58 | HR 95 | Temp 98.2°F | Ht 64.0 in | Wt 135.6 lb

## 2021-04-25 DIAGNOSIS — E785 Hyperlipidemia, unspecified: Secondary | ICD-10-CM

## 2021-04-25 DIAGNOSIS — R399 Unspecified symptoms and signs involving the genitourinary system: Secondary | ICD-10-CM

## 2021-04-25 LAB — URINALYSIS, ROUTINE W REFLEX MICROSCOPIC
Bacteria, UA: NONE SEEN /HPF
Bilirubin Urine: NEGATIVE
Glucose, UA: NEGATIVE
Hyaline Cast: NONE SEEN /LPF
Ketones, ur: NEGATIVE
Leukocytes,Ua: NEGATIVE
Nitrite: NEGATIVE
Protein, ur: NEGATIVE
Specific Gravity, Urine: 1.02 (ref 1.001–1.035)
WBC, UA: NONE SEEN /HPF (ref 0–5)
pH: 6 (ref 5.0–8.0)

## 2021-04-25 LAB — MICROSCOPIC MESSAGE

## 2021-04-25 MED ORDER — SULFAMETHOXAZOLE-TRIMETHOPRIM 800-160 MG PO TABS
1.0000 | ORAL_TABLET | Freq: Two times a day (BID) | ORAL | 0 refills | Status: AC
Start: 2021-04-25 — End: 2021-05-02

## 2021-04-25 MED ORDER — ROSUVASTATIN CALCIUM 5 MG PO TABS
5.0000 mg | ORAL_TABLET | Freq: Every day | ORAL | 1 refills | Status: AC
Start: 2021-04-25 — End: ?

## 2021-04-26 LAB — URINE CULTURE
MICRO NUMBER:: 12347359
Result:: NO GROWTH
SPECIMEN QUALITY:: ADEQUATE

## 2021-05-03 ENCOUNTER — Encounter: Payer: Self-pay | Admitting: Thoracic Surgery (Cardiothoracic Vascular Surgery)

## 2021-05-20 ENCOUNTER — Telehealth: Payer: Self-pay | Admitting: *Deleted

## 2021-05-20 NOTE — Telephone Encounter (Signed)
Patient contacted the office stating she coughed up a small amount of blood this morning. She is s/p segmentectomy 8/19 and VATs evacuation of hemothorax 8/24 by Dr. Roxan Hockey. She states she has been coughing up phlegm for the last week or so and noticed a small amount of blood this morning. She said the first time she noticed the blood it was a brighter red and the second time it was darker red in the form of two small clots. Patient denies any chest pain, SOB, or pain. Patient denies fever. Patient's follow up appt with Dr. Roxan Hockey moved up from next week to tomorrow with chest xray. Patient aware.

## 2021-05-21 ENCOUNTER — Ambulatory Visit
Admission: RE | Admit: 2021-05-21 | Discharge: 2021-05-21 | Disposition: A | Discharge status: Home / Self Care | Payer: Medicare HMO | Source: Ambulatory Visit | Attending: Thoracic Surgery (Cardiothoracic Vascular Surgery) | Admitting: Thoracic Surgery (Cardiothoracic Vascular Surgery)

## 2021-05-21 ENCOUNTER — Ambulatory Visit (INDEPENDENT_AMBULATORY_CARE_PROVIDER_SITE_OTHER): Payer: Self-pay | Admitting: Thoracic Surgery (Cardiothoracic Vascular Surgery)

## 2021-05-21 ENCOUNTER — Other Ambulatory Visit: Payer: Self-pay

## 2021-05-21 ENCOUNTER — Other Ambulatory Visit: Payer: Self-pay | Admitting: *Deleted

## 2021-05-21 VITALS — BP 133/80 | HR 76 | Resp 20 | Ht 64.0 in | Wt 139.0 lb

## 2021-05-21 DIAGNOSIS — J181 Lobar pneumonia, unspecified organism: Secondary | ICD-10-CM | POA: Diagnosis not present

## 2021-05-21 DIAGNOSIS — Z902 Acquired absence of lung [part of]: Secondary | ICD-10-CM

## 2021-05-21 DIAGNOSIS — Z9889 Other specified postprocedural states: Secondary | ICD-10-CM | POA: Diagnosis not present

## 2021-05-21 DIAGNOSIS — Z09 Encounter for follow-up examination after completed treatment for conditions other than malignant neoplasm: Secondary | ICD-10-CM

## 2021-05-21 NOTE — Progress Notes (Signed)
[address]       [place] [address]             [phone]     HPI: Sara Coffey returns for a follow-up visit after recent segmentectomy.  Sara Coffey is a 71 year old woman with a history of tobacco abuse, hyperlipidemia, osteoporosis, syncope, transient global amnesia, colon cancer, and non-small cell carcinoma of the lung.  She was found to have a groundglass opacity in the superior segment of the left lower lobe back in September 2021.  A follow-up CT showed the nodule had increased in size slightly.  She underwent a left lower lobe superior segmentectomy on 04/05/2021.  The nodule turned out to be a stage Ia adenocarcinoma.  Her postoperative course was complicated by alveolar hemorrhage.  She went back to the operating room for suspicion of hemothorax but it turned out that was primarily intra-alveolar.  I saw her in the office on 04/23/2021.  She was having quite a lot of incisional pain.  She was taking large doses of Tylenol.  We started her on Lyrica.  Since that visit her pain has improved dramatically.  She will occasionally have a shooting or stabbing pain but she is not taking any Tylenol.  She is taking the Lyrica twice daily.  She has not been having any issues with her breathing although she still feels short of breath relative to her preoperative status she did have an episode of hemoptysis and then noted some small clots on tissues following day.  She says it was only small amount of blood with her mopped assist about the size of a dime.  Her appointment was moved up from next week.  Past Medical History:  Diagnosis Date   Amnesia 04/17/2019   Hemorrhoid 09/28/2013   Hyperlipidemia    Osteoporosis    Syncope and collapse    TGA (transient global amnesia) 04/18/2019   TIA (transient ischemic attack) 04/17/2019   Tobacco abuse     Current Outpatient Medications  Medication Sig Dispense Refill   acetaminophen (TYLENOL) 500 MG tablet Take 2 tablets  (1,000 mg total) by mouth every 6 (six) hours as needed for mild pain, fever or headache. 30 tablet 0   Calcium Carb-Cholecalciferol (CALCIUM-VITAMIN D) 600-400 MG-UNIT TABS Take 1 tablet by mouth every other day.     calcium carbonate (TUMS - DOSED IN MG ELEMENTAL CALCIUM) 500 MG chewable tablet Chew 1 tablet by mouth as needed for indigestion or heartburn.     Ferrous Sulfate (IRON) 325 (65 Fe) MG TABS Take 325 mg by mouth every other day. In the morning     Melatonin 10 MG TABS Take 10 mg by mouth at bedtime as needed (sleep).     Multiple Vitamins-Minerals (MULTIVITAMIN WITH MINERALS) tablet Take 1 tablet by mouth daily. Multivitamin for Women 50+     Omega-3 Fatty Acids (FISH OIL) 1000 MG CAPS Take 2,000 mg by mouth in the morning.     oxyCODONE (OXY IR/ROXICODONE) 5 MG immediate release tablet Take 1 tablet (5 mg total) by mouth every 6 (six) hours as needed for severe pain. 30 tablet 0   pregabalin (LYRICA) 25 MG capsule Take 1 capsule (25 mg total) by mouth daily for 3 days, THEN 1 capsule (25 mg total) 2 (two) times daily. 63 capsule 0   rosuvastatin (CRESTOR) 5 MG tablet Take 1 tablet (5 mg total) by mouth daily. 90 tablet 1   vitamin B-12 (CYANOCOBALAMIN) 1000 MCG  tablet Take 2,000 mcg by mouth daily.     No current facility-administered medications for this visit.    Physical Exam BP 133/80   Pulse 76   Resp 20   Ht 5\' 4"  (1.626 m)   Wt 139 lb (63 kg)   SpO2 97% Comment: RA  BMI 23.86 kg/m  Well-appearing 70 year old woman in no acute distress Alert and oriented x3 with no focal deficits Lungs diminished at left base but otherwise clear Cardiac regular rate and rhythm  Diagnostic Tests: CHEST - 2 VIEW   COMPARISON:  04/23/2021   FINDINGS: The cardiomediastinal silhouette is unchanged with normal heart size. Postoperative changes and volume loss are again noted in the left lung. Left lower lobe consolidation has slightly improved. The right lung remains clear. No  sizable pleural effusion or pneumothorax is identified. No acute osseous abnormality is identified.   IMPRESSION: Slightly improved left lower lobe consolidation.     Electronically Signed   By: Logan Bores M.D.   On: 05/21/2021 12:22 I personally reviewed the chest x-ray images.  Concur with the findings of slightly improved left lower lobe consolidation.  Impression: Sara Coffey is a 70 year old woman who had a left lower lobe superior segmentectomy for stage Ia adenocarcinoma about 6 weeks ago.  Her postoperative course was complicated by intra-alveolar hemorrhage into the left lower lobe.  She currently is making good progress and is overall doing well although she did have recent episode of hemoptysis.  I suspect that is related to her previous alveolar hemorrhage and not new bleeding.  She has not had any fevers or chills or purulent sputum to suggest infection.  I personally call if she has any repeated hemoptysis.  Otherwise I will plan to see her back in about a month with a chest x-ray.  She will remain on the Lyrica for now.  She will need a refill on that in about a week and a half.   Plan: Patient will call if she experiences any significant hemoptysis Return in 1 month with PA and lateral chest x-ray  [name], MD [facility] [phone]

## 2021-05-22 ENCOUNTER — Other Ambulatory Visit (HOSPITAL_COMMUNITY): Payer: Self-pay | Admitting: Nurse Practitioner

## 2021-05-22 DIAGNOSIS — Z1231 Encounter for screening mammogram for malignant neoplasm of breast: Secondary | ICD-10-CM

## 2021-05-23 ENCOUNTER — Other Ambulatory Visit: Payer: Self-pay

## 2021-05-23 ENCOUNTER — Telehealth: Payer: Self-pay

## 2021-05-23 MED ORDER — PREGABALIN 25 MG PO CAPS: 25.0000 mg | ORAL_CAPSULE | Freq: Two times a day (BID) | ORAL | 0 refills | Status: DC

## 2021-05-23 NOTE — Progress Notes (Signed)
Sent in RX for Lyrica per Dr. Leonarda Salon last note of patient needing refill. Patient made aware and sent to requesting pharmacy [facility] [address], [place].

## 2021-05-23 NOTE — Telephone Encounter (Signed)
-----   Message from [name], MD sent at 05/23/2021 12:41 PM EDT ----- Regarding: RE: Pregabalin refill Thanks   ----- Message ----- From: Donnella Sham, RN Sent: 05/23/2021  11:54 AM EDT To: [name], MD Subject: Pregabalin refill                              Hey,  Saw your note about patient will need refill of pregabalin soon. She called today requesting refill. Refill sent into [facility] [address] per patient's request. Just FYI.  Thanks,  Caryl Pina

## 2021-05-26 NOTE — Progress Notes (Signed)
Patient: Sara Coffey, Female    DOB: 22-Apr-1951, 70 y.o.   MRN: [id number]  Visit Date: 05/27/2021  Today's Provider: Eulogio Bear, NP   Chief Complaint  Patient presents with   AWV     Subjective:   Sara Coffey is a 70 y.o. female who presents today for her Subsequent Annual Wellness Visit.  Care team: Primary Alpha Gula, NP Cardiothoracic surgery -Modesto Charon, MD Gastroenterology - Hildred Laser, MD Oncology -Derek Jack, MD  HPI  SKIN LESION Patient reports sore on her skin on her upper left back.  It gets flaky and she scrapes at it and it bleeds.  It itches at times.  Reports she used to have areas on her skin that her previous doctor would freeze. Duration: months Location: left upper back Painful: no Itching: yes Onset: gradual Context: bigger, flakes, and bleeds Associated signs and symptoms: none History of skin cancer: no History of precancerous skin lesions: yes Family history of skin cancer: no  Closely follow with [facility], GI, Oncology for history of colon and lung cancer.  Has mammogram later this week.  Review of Systems  Constitutional: Negative.   HENT: Negative.    Eyes: Negative.   Respiratory: Negative.    Cardiovascular: Negative.   Gastrointestinal: Negative.   Genitourinary: Negative.   Musculoskeletal: Negative.   Skin:        + lesion  Neurological: Negative.   Psychiatric/Behavioral: Negative.     Past Medical History:  Diagnosis Date   Amnesia 04/17/2019   Hemorrhoid 09/28/2013   Hyperlipidemia    Osteoporosis    Syncope and collapse    TGA (transient global amnesia) 04/18/2019   TIA (transient ischemic attack) 04/17/2019   Tobacco abuse     Past Surgical History:  Procedure Laterality Date   BIOPSY  03/02/2020   Procedure: BIOPSY;  Surgeon: Rogene Houston, MD;  [facility];  Service: Endoscopy;;  ascending colon mass   COLON SURGERY     COLONOSCOPY WITH PROPOFOL N/A  03/02/2020   Procedure: COLONOSCOPY WITH PROPOFOL;  Surgeon: Rogene Houston, MD;  [facility];  Service: Endoscopy;  Laterality: N/A;  1055   COLONOSCOPY WITH PROPOFOL N/A 01/09/2021   Procedure: COLONOSCOPY WITH PROPOFOL;  Surgeon: Rogene Houston, MD;  [facility];  Service: Endoscopy;  Laterality: N/A;  Meno BLOCK  04/05/2021   Procedure: INTERCOSTAL NERVE BLOCK;  Surgeon: [name], MD;  Location: [place];  Service: Thoracic;;   KNEE SX     LAPAROSCOPIC PARTIAL COLECTOMY N/A 04/09/2020   Procedure: LAPAROSCOPIC PARTIAL COLECTOMY;  Surgeon: Virl Cagey, MD;  Location: [facility];  Service: General;  Laterality: N/A;   left eye surgery      LYMPH NODE DISSECTION Left 04/05/2021   Procedure: LYMPH NODE DISSECTION;  Surgeon: [name], MD;  Location: [place];  Service: Thoracic;  Laterality: Left;   POLYPECTOMY  03/02/2020   Procedure: POLYPECTOMY;  Surgeon: Rogene Houston, MD;  [facility];  Service: Endoscopy;;   POLYPECTOMY  01/09/2021   Procedure: POLYPECTOMY;  Surgeon: Rogene Houston, MD;  [facility];  Service: Endoscopy;;   THORACOTOMY  04/05/2021   Procedure: THORACOTOMY MAJOR;  Surgeon: [name], MD;  Location: [place];  Service: Thoracic;;   TUBAL LIGATION     VIDEO ASSISTED THORACOSCOPY Left 04/10/2021   Procedure: VIDEO ASSISTED THORACOSCOPY, EVACUATION OF HEMOTHORAX, LEFT LOWER LOBE WEDGE RESECTION;  Surgeon: [name], MD;  Location: [facility];  Service: Thoracic;  Laterality: Left;   XI ROBOTIC ASSISTED THORACOSCOPY- SEGMENTECTOMY Left 04/05/2021   Procedure: XI ROBOTIC ASSISTED THORACOSCOPY-LEFT LOWER LOBE SUPERIOR SEGMENTECTOMY;  Surgeon: [name], MD;  Location: [facility];  Service: Thoracic;  Laterality: Left;    Family History  Problem Relation Age of Onset   Heart disease Father    Hypertension Father    Diabetes Father    Cancer Brother     Healthy Son    Healthy Son    Healthy Daughter    Ulcers Mother    Kidney failure Maternal Grandfather    Throat cancer Paternal Grandfather    Cancer Brother    Breast cancer Neg Hx     Social History   Socioeconomic History   Marital status: Married    Spouse name: Not on file   Number of children: 3   Years of education: Not on file   Highest education level: Not on file  Occupational History   Occupation: retired  Tobacco Use   Smoking status: Former    Packs/day: 1.00    Years: 40.00    Pack years: 40.00    Types: Cigarettes    Quit date: 03/28/2021    Years since quitting: 0.1   Smokeless tobacco: Never  Vaping Use   Vaping Use: Never used  Substance and Sexual Activity   Alcohol use: No   Drug use: No   Sexual activity: Not Currently    Birth control/protection: Surgical, Post-menopausal    Comment: tubal  Other Topics Concern   Not on file  Social History Narrative   Not on file   Social Determinants of Health   Financial Resource Strain: Not on file  Food Insecurity: Not on file  Transportation Needs: Not on file  Physical Activity: Not on file  Stress: Not on file  Social Connections: Not on file  Intimate Partner Violence: Not on file    Outpatient Encounter Medications as of 05/27/2021  Medication Sig   acetaminophen (TYLENOL) 500 MG tablet Take 2 tablets (1,000 mg total) by mouth every 6 (six) hours as needed for mild pain, fever or headache.   Calcium Carb-Cholecalciferol (CALCIUM-VITAMIN D) 600-400 MG-UNIT TABS Take 1 tablet by mouth every other day.   calcium carbonate (TUMS - DOSED IN MG ELEMENTAL CALCIUM) 500 MG chewable tablet Chew 1 tablet by mouth as needed for indigestion or heartburn.   Ferrous Sulfate (IRON) 325 (65 Fe) MG TABS Take 325 mg by mouth every other day. In the morning   Melatonin 10 MG TABS Take 10 mg by mouth at bedtime as needed (sleep).   Multiple Vitamins-Minerals (MULTIVITAMIN WITH MINERALS) tablet Take 1 tablet by  mouth daily. Multivitamin for Women 50+   Omega-3 Fatty Acids (FISH OIL) 1000 MG CAPS Take 2,000 mg by mouth in the morning.   oxyCODONE (OXY IR/ROXICODONE) 5 MG immediate release tablet Take 1 tablet (5 mg total) by mouth every 6 (six) hours as needed for severe pain. (Patient not taking: Reported on 05/27/2021)   pregabalin (LYRICA) 25 MG capsule Take 1 capsule (25 mg total) by mouth 2 (two) times daily.   rosuvastatin (CRESTOR) 5 MG tablet Take 1 tablet (5 mg total) by mouth daily.   vitamin B-12 (CYANOCOBALAMIN) 1000 MCG tablet Take 2,000 mcg by mouth daily.   No facility-administered encounter medications on file as of 05/27/2021.    Functional Status Survey: Is the patient deaf or have difficulty  hearing?: No (ringing in ears) Does the patient have difficulty seeing, even when wearing glasses/contacts?: No Does the patient have difficulty concentrating, remembering, or making decisions?: No Does the patient have difficulty walking or climbing stairs?: No Does the patient have difficulty dressing or bathing?: No Does the patient have difficulty doing errands alone such as visiting a doctor's office or shopping?: No   Fall Risk Assessment Fall Risk  05/27/2021 03/27/2020 03/22/2020 10/04/2019 09/19/2019  Falls in the past year? 0 0 0 0 0  Number falls in past yr: 0 0 - 0 0  Injury with Fall? 0 0 - 0 0  Risk for fall due to : No Fall Risks - - - -  Follow up Falls evaluation completed Falls evaluation completed Falls evaluation completed - Falls evaluation completed    Depression Screen Depression screen [facility] 2/9 05/27/2021 03/27/2020 03/22/2020 10/04/2019 09/19/2019  Decreased Interest 0 0 0 0 0  Down, Depressed, Hopeless 0 0 0 0 1  PHQ - 2 Score 0 0 0 0 1  Altered sleeping - 0 0 - 0  Tired, decreased energy - 0 0 - 1  Change in appetite - 0 0 - 0  Feeling bad or failure about yourself  - 0 0 - 0  Trouble concentrating - 0 0 - 0  Moving slowly or fidgety/restless - 0 0 - 0  Suicidal  thoughts - 0 0 - 0  PHQ-9 Score - 0 0 - 2  Difficult doing work/chores - Not difficult at all Not difficult at all - Not difficult at all    6CIT Screen 05/27/2021  What Year? 0 points  What month? 0 points  What time? 0 points  Count back from 20 0 points  Months in reverse 0 points  Repeat phrase 0 points  Total Score 0    Advanced Directives Does patient have a HCPOA?    no If yes, name and contact information:  wishes for this to be daughter, Shellia Carwin Does patient have a living will or MOST form?  no  Objective:   Vitals: BP 110/60   Pulse 83   Temp 98.1 F (36.7 C)   Ht 5\' 4"  (1.626 m)   Wt 139 lb (63 kg)   SpO2 99%   BMI 23.86 kg/m  Body mass index is 23.86 kg/m. No results found.  Physical Exam Vitals and nursing note reviewed.  Constitutional:      General: She is not in acute distress.    Appearance: Normal appearance. She is not toxic-appearing.  Neck:     Vascular: No carotid bruit.  Cardiovascular:     Rate and Rhythm: Normal rate and regular rhythm.     Heart sounds: Murmur heard.  Pulmonary:     Effort: Pulmonary effort is normal. No respiratory distress.     Breath sounds: Normal breath sounds. No wheezing, rhonchi or rales.  Musculoskeletal:     Cervical back: Normal range of motion.     Right lower leg: No edema.     Left lower leg: No edema.  Skin:    General: Skin is warm and dry.          Comments: 1 cm x 1 cm irregular shaped papule; part of papule more raised and erythematous than other area which is hypopigmented.  No drainage, oozing.  Neurological:     Mental Status: She is alert and oriented to person, place, and time.  Psychiatric:        Mood  and Affect: Mood normal.        Behavior: Behavior normal.        Thought Content: Thought content normal.        Judgment: Judgment normal.     Assessment & Plan:    Annual Wellness Visit 1. Encounter for annual wellness exam in Medicare patient   2. Hyperlipidemia,  unspecified hyperlipidemia type Check lipids with kidney function, electrolytes, liver enzymes, and blood counts.  Not fasting today.  Follow up pending blood work.  - CBC with Differential/Platelet - COMPLETE METABOLIC PANEL WITH GFR - Lipid panel  3. Inflammatory papule to upper left back; will place referral to Dermatology for possible biopsy and removal  4. Immunization due  - Flu Vaccine QUAD High Dose(Fluad)   Reviewed patient's Family Medical History  Reviewed and updated list of patient's medical providers  Assessment of cognitive impairment was done  Assessed patient's functional ability  Established a written schedule for health screening [place] Completed and Reviewed  Packet given regarding advanced directives, living will.  She will review and fill out with her daughter.  Health Maintenance reviewed - labs ordered  Flu shot given today  Recommended shingles and COVID booster.  Immunization History  Administered Date(s) Administered   Fluad Quad(high Dose 65+) 05/19/2019, 05/27/2021   Influenza-Unspecified 07/18/2020   Moderna Sars-Covid-2 Vaccination 11/01/2019   PFIZER(Purple Top)SARS-COV-2 Vaccination 11/01/2019, 11/22/2019, 07/20/2020   Pneumococcal Conjugate-13 09/19/2019   Pneumococcal Polysaccharide-23 09/19/2020    Health Maintenance  Topic Date Due   Zoster Vaccines- Shingrix (1 of 2) Never done   COVID-19 Vaccine (4 - Booster for Pfizer series) 10/12/2020   TETANUS/TDAP  09/19/2021 (Originally 06/02/1970)   MAMMOGRAM  10/11/2021   COLONOSCOPY (Pts 45-53yrs Insurance coverage will need to be confirmed)  01/10/2024   INFLUENZA VACCINE  Completed   DEXA SCAN  Completed   Hepatitis C Screening  Completed   HPV VACCINES  Aged Out    Discussed health benefits of physical activity, and encouraged her to engage in regular exercise appropriate for her age and condition.   No orders of the defined types were placed in  this encounter.   Current Outpatient Medications:    acetaminophen (TYLENOL) 500 MG tablet, Take 2 tablets (1,000 mg total) by mouth every 6 (six) hours as needed for mild pain, fever or headache., Disp: 30 tablet, Rfl: 0   Calcium Carb-Cholecalciferol (CALCIUM-VITAMIN D) 600-400 MG-UNIT TABS, Take 1 tablet by mouth every other day., Disp: , Rfl:    calcium carbonate (TUMS - DOSED IN MG ELEMENTAL CALCIUM) 500 MG chewable tablet, Chew 1 tablet by mouth as needed for indigestion or heartburn., Disp: , Rfl:    Ferrous Sulfate (IRON) 325 (65 Fe) MG TABS, Take 325 mg by mouth every other day. In the morning, Disp: , Rfl:    Melatonin 10 MG TABS, Take 10 mg by mouth at bedtime as needed (sleep)., Disp: , Rfl:    Multiple Vitamins-Minerals (MULTIVITAMIN WITH MINERALS) tablet, Take 1 tablet by mouth daily. Multivitamin for Women 50+, Disp: , Rfl:    Omega-3 Fatty Acids (FISH OIL) 1000 MG CAPS, Take 2,000 mg by mouth in the morning., Disp: , Rfl:    oxyCODONE (OXY IR/ROXICODONE) 5 MG immediate release tablet, Take 1 tablet (5 mg total) by mouth every 6 (six) hours as needed for severe pain. (Patient not taking: Reported on 05/27/2021), Disp: 30 tablet, Rfl: 0   pregabalin (LYRICA) 25 MG capsule, Take 1 capsule (25 mg  total) by mouth 2 (two) times daily., Disp: 60 capsule, Rfl: 0   rosuvastatin (CRESTOR) 5 MG tablet, Take 1 tablet (5 mg total) by mouth daily., Disp: 90 tablet, Rfl: 1   vitamin B-12 (CYANOCOBALAMIN) 1000 MCG tablet, Take 2,000 mcg by mouth daily., Disp: , Rfl:  There are no discontinued medications.  Next Medicare Wellness Visit in 12+ months

## 2021-05-27 ENCOUNTER — Encounter: Payer: Self-pay | Admitting: Nurse Practitioner

## 2021-05-27 ENCOUNTER — Other Ambulatory Visit: Payer: Self-pay

## 2021-05-27 ENCOUNTER — Ambulatory Visit (INDEPENDENT_AMBULATORY_CARE_PROVIDER_SITE_OTHER): Payer: Medicare HMO | Admitting: Nurse Practitioner

## 2021-05-27 VITALS — BP 110/60 | HR 83 | Temp 98.1°F | Ht 64.0 in | Wt 139.0 lb

## 2021-05-27 DIAGNOSIS — E785 Hyperlipidemia, unspecified: Secondary | ICD-10-CM

## 2021-05-27 DIAGNOSIS — E875 Hyperkalemia: Secondary | ICD-10-CM

## 2021-05-27 DIAGNOSIS — Z23 Encounter for immunization: Secondary | ICD-10-CM

## 2021-05-27 DIAGNOSIS — R238 Other skin changes: Secondary | ICD-10-CM | POA: Diagnosis not present

## 2021-05-27 DIAGNOSIS — Z0000 Encounter for general adult medical examination without abnormal findings: Secondary | ICD-10-CM | POA: Diagnosis not present

## 2021-05-28 ENCOUNTER — Ambulatory Visit: Payer: Medicare HMO | Admitting: Thoracic Surgery (Cardiothoracic Vascular Surgery)

## 2021-05-28 LAB — COMPLETE METABOLIC PANEL WITH GFR
AG Ratio: 1.7 (calc) (ref 1.0–2.5)
ALT: 11 U/L (ref 6–29)
AST: 15 U/L (ref 10–35)
Albumin: 4.3 g/dL (ref 3.6–5.1)
Alkaline phosphatase (APISO): 91 U/L (ref 37–153)
BUN: 8 mg/dL (ref 7–25)
CO2: 32 mmol/L (ref 20–32)
Calcium: 10 mg/dL (ref 8.6–10.4)
Chloride: 106 mmol/L (ref 98–110)
Creat: 0.75 mg/dL (ref 0.50–1.05)
Globulin: 2.6 g/dL (calc) (ref 1.9–3.7)
Glucose, Bld: 75 mg/dL (ref 65–99)
Potassium: 5.5 mmol/L — ABNORMAL HIGH (ref 3.5–5.3)
Sodium: 141 mmol/L (ref 135–146)
Total Bilirubin: 0.4 mg/dL (ref 0.2–1.2)
Total Protein: 6.9 g/dL (ref 6.1–8.1)
eGFR: 87 mL/min/{1.73_m2} (ref >=60)

## 2021-05-28 LAB — CBC WITH DIFFERENTIAL/PLATELET
Absolute Monocytes: 353 cells/uL (ref 200–950)
Basophils Absolute: 61 cells/uL (ref 0–200)
Basophils Relative: 1.3 %
Eosinophils Absolute: 282 cells/uL (ref 15–500)
Eosinophils Relative: 6 %
HCT: 39.6 % (ref 35.0–45.0)
Hemoglobin: 12.3 g/dL (ref 11.7–15.5)
Lymphs Abs: 1579 cells/uL (ref 850–3900)
MCH: 28.5 pg (ref 27.0–33.0)
MCHC: 31.1 g/dL — ABNORMAL LOW (ref 32.0–36.0)
MCV: 91.9 fL (ref 80.0–100.0)
MPV: 9.3 fL (ref 7.5–12.5)
Monocytes Relative: 7.5 %
Neutro Abs: 2425 cells/uL (ref 1500–7800)
Neutrophils Relative %: 51.6 %
Platelets: 346 10*3/uL (ref 140–400)
RBC: 4.31 10*6/uL (ref 3.80–5.10)
RDW: 13.4 % (ref 11.0–15.0)
Total Lymphocyte: 33.6 %
WBC: 4.7 10*3/uL (ref 3.8–10.8)

## 2021-05-28 LAB — LIPID PANEL
Cholesterol: 268 mg/dL — ABNORMAL HIGH (ref <200)
HDL: 54 mg/dL (ref >=50)
LDL Cholesterol (Calc): 175 mg/dL (calc) — ABNORMAL HIGH
Non-HDL Cholesterol (Calc): 214 mg/dL (calc) — ABNORMAL HIGH (ref <130)
Total CHOL/HDL Ratio: 5 (calc) — ABNORMAL HIGH (ref <5.0)
Triglycerides: 216 mg/dL — ABNORMAL HIGH (ref <150)

## 2021-05-29 ENCOUNTER — Ambulatory Visit (HOSPITAL_COMMUNITY)
Admission: RE | Admit: 2021-05-29 | Discharge: 2021-05-29 | Disposition: A | Discharge status: Home / Self Care | Payer: Medicare HMO | Source: Ambulatory Visit | Attending: Nurse Practitioner | Admitting: Nurse Practitioner

## 2021-05-29 ENCOUNTER — Other Ambulatory Visit: Payer: Self-pay

## 2021-05-29 DIAGNOSIS — Z1231 Encounter for screening mammogram for malignant neoplasm of breast: Secondary | ICD-10-CM | POA: Diagnosis not present

## 2021-05-29 NOTE — Addendum Note (Signed)
Addended by: Noemi Chapel A on: 05/29/2021 10:07 AM   Modules accepted: Orders

## 2021-06-03 ENCOUNTER — Encounter (HOSPITAL_COMMUNITY): Payer: Self-pay

## 2021-06-05 ENCOUNTER — Other Ambulatory Visit (HOSPITAL_COMMUNITY): Payer: Medicare HMO

## 2021-06-12 ENCOUNTER — Ambulatory Visit (HOSPITAL_COMMUNITY): Payer: Medicare HMO | Admitting: Hematology

## 2021-06-24 ENCOUNTER — Other Ambulatory Visit: Payer: Self-pay | Admitting: Thoracic Surgery (Cardiothoracic Vascular Surgery)

## 2021-06-24 DIAGNOSIS — R911 Solitary pulmonary nodule: Secondary | ICD-10-CM

## 2021-06-25 ENCOUNTER — Ambulatory Visit (INDEPENDENT_AMBULATORY_CARE_PROVIDER_SITE_OTHER): Payer: Self-pay | Admitting: Thoracic Surgery (Cardiothoracic Vascular Surgery)

## 2021-06-25 ENCOUNTER — Ambulatory Visit
Admission: RE | Admit: 2021-06-25 | Discharge: 2021-06-25 | Disposition: A | Discharge status: Home / Self Care | Payer: Medicare HMO | Source: Ambulatory Visit | Attending: Thoracic Surgery (Cardiothoracic Vascular Surgery) | Admitting: Thoracic Surgery (Cardiothoracic Vascular Surgery)

## 2021-06-25 ENCOUNTER — Other Ambulatory Visit: Payer: Self-pay | Admitting: *Deleted

## 2021-06-25 ENCOUNTER — Other Ambulatory Visit: Payer: Self-pay

## 2021-06-25 VITALS — BP 123/67 | HR 60 | Resp 20 | Ht 64.0 in | Wt 140.0 lb

## 2021-06-25 DIAGNOSIS — J181 Lobar pneumonia, unspecified organism: Secondary | ICD-10-CM | POA: Diagnosis not present

## 2021-06-25 DIAGNOSIS — Z9889 Other specified postprocedural states: Secondary | ICD-10-CM | POA: Diagnosis not present

## 2021-06-25 DIAGNOSIS — Z902 Acquired absence of lung [part of]: Secondary | ICD-10-CM

## 2021-06-25 DIAGNOSIS — J90 Pleural effusion, not elsewhere classified: Secondary | ICD-10-CM

## 2021-06-25 DIAGNOSIS — R911 Solitary pulmonary nodule: Secondary | ICD-10-CM

## 2021-06-25 DIAGNOSIS — R918 Other nonspecific abnormal finding of lung field: Secondary | ICD-10-CM

## 2021-06-25 NOTE — Progress Notes (Signed)
Sara Coffey       Sara Coffey,Sara Coffey             [id number]       HPI: Sara Coffey returns for a scheduled postoperative follow-up visit  Sara Coffey is a 70 year old woman with a history of tobacco abuse, lung cancer, hyperlipidemia, osteoporosis, syncope with transient global amnesia, TIA, and colon cancer.  She had surgery for colon cancer in 2021.  In September 2020 when she was found to have a groundglass opacity in the left lower lobe.  On a follow-up scan that nodule increased in size slightly and now had a solid component.  She underwent a robotic assisted left lower lobe superior segmentectomy.  Did require conversion to thoracotomy.  In the early postoperative period she had intra-alveolar hemorrhage.  She was reexplored for possible pleural effusion but it turned out this was all hemorrhage into the airspaces.  She went home 3 days after the reexploration.  Saw in the office on 04/23/2021.  She was doing better at that time.  She was having a lot of pain and we started her on Lyrica 25 mg twice daily.  She still has some pain but it has improved.  She is decreased the Lyrica to 25 mg daily and is planning to stop that in a few days.  Her respiratory capacity is gradually improving.   Past Medical History:  Diagnosis Date   Amnesia 04/17/2019   Hemorrhoid 09/28/2013   Hyperlipidemia    Osteoporosis    Syncope and collapse    TGA (transient global amnesia) 04/18/2019   TIA (transient ischemic attack) 04/17/2019   Tobacco abuse      Current Outpatient Medications  Medication Sig Dispense Refill   acetaminophen (TYLENOL) 500 MG tablet Take 2 tablets (1,000 mg total) by mouth every 6 (six) hours as needed for mild pain, fever or headache. 30 tablet 0   Calcium Carb-Cholecalciferol (CALCIUM-VITAMIN D) 600-400 MG-UNIT TABS Take 1 tablet by mouth every other day.     calcium carbonate (TUMS - DOSED IN MG ELEMENTAL CALCIUM) 500 MG chewable tablet Chew 1  tablet by mouth as needed for indigestion or heartburn.     Ferrous Sulfate (IRON) 325 (65 Fe) MG TABS Take 325 mg by mouth every other day. In the morning     Melatonin 10 MG TABS Take 10 mg by mouth at bedtime as needed (sleep).     Multiple Vitamins-Minerals (MULTIVITAMIN WITH MINERALS) tablet Take 1 tablet by mouth daily. Multivitamin for Women 50+     Omega-3 Fatty Acids (FISH OIL) 1000 MG CAPS Take 2,000 mg by mouth in the morning.     oxyCODONE (OXY IR/ROXICODONE) 5 MG immediate release tablet Take 1 tablet (5 mg total) by mouth every 6 (six) hours as needed for severe pain. 30 tablet 0   pregabalin (LYRICA) 25 MG capsule Take 1 capsule (25 mg total) by mouth 2 (two) times daily. 60 capsule 0   rosuvastatin (CRESTOR) 5 MG tablet Take 1 tablet (5 mg total) by mouth daily. 90 tablet 1   vitamin B-12 (CYANOCOBALAMIN) 1000 MCG tablet Take 2,000 mcg by mouth daily.     No current facility-administered medications for this visit.    Physical Exam BP 123/67   Pulse 60   Resp 20   Ht 5\' 4"  (1.626 m)   Wt 140 lb (63.5 kg)   SpO2 100% Comment: RA  BMI 24.4 kg/m  70 year old woman in  no acute distress Alert and oriented x3 with no focal deficits Diminished breath sounds left base, otherwise clear Incisions healing well  Diagnostic Tests: CHEST - 2 VIEW   COMPARISON:  Radiographs 05/21/2021 and 04/23/2021.  CT 02/27/2021.   FINDINGS: The heart size and mediastinal contours are stable. There is stable volume loss in the left hemithorax status post lower lobe wedge resection. The left lower lobe consolidation shows further slight improvement. There may be a small amount of associated loculated pleural fluid posterolaterally. The right lung is clear. No pneumothorax. The bones appear unchanged.   IMPRESSION: Further slight improvement in left lower lobe consolidation with probable adjacent loculated pleural fluid. No pneumothorax or new findings.     Electronically Signed    By: Richardean Sale M.D.   On: 06/25/2021 14:18 I personally reviewed the chest x-ray images.  There has been improvement of the left lower lobe consolidation.  Question of whether there is residual consolidation versus loculated pleural fluid.  Impression: Sara Coffey is a 70 year old woman with a history of tobacco abuse, lung cancer, hyperlipidemia, osteoporosis, syncope with transient global amnesia, TIA, and colon cancer.   She had a robotic assisted left lower lobe superior segmentectomy with conversion to thoracotomy on 04/05/2021.  The early postoperative course she developed an opacity on her chest x-ray that I thought was a loculated effusion.  It turned out that was intra-alveolar hemorrhage into the left lower lobe.  Her chest x-ray now shows progressive improvement but there is still some opacity posteriorly.  It is unclear if this is residual consolidated lung or loculated fluid.  I wanted to a CT to help sort that out.  Postoperative pain-improving.  She would like to come off of Lyrica.  She will try stopping that medication.  If her pain worsens she can call and we can restart that.  Plan: CT chest to differentiate between residual consolidation and loculated pleural effusion. Will follow up with a virtual visit in 1 week.  [name], MD [facility] [phone]

## 2021-07-16 ENCOUNTER — Ambulatory Visit (HOSPITAL_COMMUNITY): Admission: RE | Admit: 2021-07-16 | Payer: Medicare HMO | Source: Ambulatory Visit

## 2021-07-16 ENCOUNTER — Telehealth: Payer: Medicare HMO | Admitting: Thoracic Surgery (Cardiothoracic Vascular Surgery)

## 2021-07-17 DIAGNOSIS — L821 Other seborrheic keratosis: Secondary | ICD-10-CM | POA: Diagnosis not present

## 2021-07-17 DIAGNOSIS — L814 Other melanin hyperpigmentation: Secondary | ICD-10-CM | POA: Diagnosis not present

## 2021-07-17 DIAGNOSIS — D1801 Hemangioma of skin and subcutaneous tissue: Secondary | ICD-10-CM | POA: Diagnosis not present

## 2021-07-17 DIAGNOSIS — C44529 Squamous cell carcinoma of skin of other part of trunk: Secondary | ICD-10-CM | POA: Diagnosis not present

## 2021-07-17 DIAGNOSIS — D485 Neoplasm of uncertain behavior of skin: Secondary | ICD-10-CM | POA: Diagnosis not present

## 2021-07-25 ENCOUNTER — Other Ambulatory Visit: Payer: Self-pay

## 2021-07-25 ENCOUNTER — Ambulatory Visit (HOSPITAL_COMMUNITY)
Admission: RE | Admit: 2021-07-25 | Discharge: 2021-07-25 | Disposition: A | Discharge status: Home / Self Care | Payer: Medicare HMO | Source: Ambulatory Visit | Attending: Thoracic Surgery (Cardiothoracic Vascular Surgery) | Admitting: Thoracic Surgery (Cardiothoracic Vascular Surgery)

## 2021-07-25 DIAGNOSIS — R918 Other nonspecific abnormal finding of lung field: Secondary | ICD-10-CM

## 2021-07-25 DIAGNOSIS — J90 Pleural effusion, not elsewhere classified: Secondary | ICD-10-CM | POA: Diagnosis not present

## 2021-07-25 DIAGNOSIS — I700 Atherosclerosis of aorta: Secondary | ICD-10-CM | POA: Diagnosis not present

## 2021-07-30 ENCOUNTER — Ambulatory Visit (INDEPENDENT_AMBULATORY_CARE_PROVIDER_SITE_OTHER): Payer: Medicare HMO | Admitting: Thoracic Surgery (Cardiothoracic Vascular Surgery)

## 2021-07-30 ENCOUNTER — Other Ambulatory Visit: Payer: Self-pay

## 2021-07-30 DIAGNOSIS — C3492 Malignant neoplasm of unspecified part of left bronchus or lung: Secondary | ICD-10-CM

## 2021-07-30 NOTE — Progress Notes (Signed)
[address]       [place] [address]             [phone]     HPI: This is a telephone visit in follow-up to Sara Coffey recent office visit from 06/25/2021.  This was conducted as a telephone visit per her request.  Patient location: Home MD location: Office  Sara Coffey is a 70 year old woman with a history of tobacco abuse, lung cancer, hyperlipidemia, osteoporosis, syncope with transient global amnesia, TIA, and colon cancer.  In September 2020 when she was found to have a groundglass opacity in the left lower lobe.  On a follow-up scans that nodule increased in size slightly and developed a solid component.     She had a robotic assisted left lower lobe superior segmentectomy with conversion to thoracotomy on 04/05/2021.  The early postoperative course she developed an opacity on her chest x-ray that was thought to be a loculated effusion.  However, it was intra-alveolar hemorrhage.  I saw her in the office on 06/25/2021.  She was making slow but steady progress.  Her pain had improved.  She was on Lyrica 25 mg daily.  She felt like her respiratory status was slowly improving although she did still have a cough.  Her chest x-ray showed persistent opacity at the left base so I ordered a CT to better assess that area.  Past Medical History:  Diagnosis Date   Amnesia 04/17/2019   Hemorrhoid 09/28/2013   Hyperlipidemia    Osteoporosis    Syncope and collapse    TGA (transient global amnesia) 04/18/2019   TIA (transient ischemic attack) 04/17/2019   Tobacco abuse     Current Outpatient Medications  Medication Sig Dispense Refill   acetaminophen (TYLENOL) 500 MG tablet Take 2 tablets (1,000 mg total) by mouth every 6 (six) hours as needed for mild pain, fever or headache. 30 tablet 0   Calcium Carb-Cholecalciferol (CALCIUM-VITAMIN D) 600-400 MG-UNIT TABS Take 1 tablet by mouth every other day.     calcium carbonate (TUMS - DOSED IN MG ELEMENTAL CALCIUM) 500  MG chewable tablet Chew 1 tablet by mouth as needed for indigestion or heartburn.     Ferrous Sulfate (IRON) 325 (65 Fe) MG TABS Take 325 mg by mouth every other day. In the morning     Melatonin 10 MG TABS Take 10 mg by mouth at bedtime as needed (sleep).     Multiple Vitamins-Minerals (MULTIVITAMIN WITH MINERALS) tablet Take 1 tablet by mouth daily. Multivitamin for Women 50+     Omega-3 Fatty Acids (FISH OIL) 1000 MG CAPS Take 2,000 mg by mouth in the morning.     oxyCODONE (OXY IR/ROXICODONE) 5 MG immediate release tablet Take 1 tablet (5 mg total) by mouth every 6 (six) hours as needed for severe pain. 30 tablet 0   pregabalin (LYRICA) 25 MG capsule Take 1 capsule (25 mg total) by mouth 2 (two) times daily. 60 capsule 0   rosuvastatin (CRESTOR) 5 MG tablet Take 1 tablet (5 mg total) by mouth daily. 90 tablet 1   vitamin B-12 (CYANOCOBALAMIN) 1000 MCG tablet Take 2,000 mcg by mouth daily.     No current facility-administered medications for this visit.    Physical Exam None-telephone  Diagnostic Tests: CT CHEST WITHOUT CONTRAST   TECHNIQUE: Multidetector CT imaging of the chest was performed following the standard protocol without IV contrast.   COMPARISON:  Chest CT 02/27/2021   FINDINGS: Cardiovascular: The  heart is normal in size. No pericardial effusion. Stable aortic calcifications but no aneurysm.   Mediastinum/Nodes: No mediastinal or hilar mass or lymphadenopathy. Small scattered lymph nodes are stable. The esophagus is grossly normal.   Lungs/Pleura: Postoperative changes from a left lower lobe wedge resection with multiple suture lines. There is a small complex left pleural effusion which demonstrates high attenuation which could represent some residual blood products/hemothorax. There is also a somewhat rounded 6.7 x 5.6 cm fluid collection in the left lower lobe which contains a few air bronchograms and some scattered air collections. Could not exclude a lung  abscess given the right clinical situation. This could be necrotic lung from a vascular injury or it may be benign loculated liquified parenchymal hematoma.   The right lung is clear. No worrisome pulmonary lesions or nodules.   Upper Abdomen: No significant upper abdominal findings. Age advanced aortic calcifications. Stable left hepatic lobe cyst.   Musculoskeletal: No significant bony findings.   IMPRESSION: 1. Postoperative changes from a left lower lobe wedge resection. 2. Small complex left pleural effusion. 3. Rounded 6 cm fluid collection in the left lower lobe which contains a few air bronchograms and some scattered air collections. Could not exclude a lung abscess given the right clinical situation. This could be necrotic lung from a vascular injury or it may be benign loculated liquified parenchymal hematoma. 4. No mediastinal or hilar mass or adenopathy. 5. Age advanced atherosclerotic calcifications involving the aorta and branch vessels. 6. Aortic atherosclerosis.   Aortic Atherosclerosis (ICD10-I70.0).     Electronically Signed   By: Marijo Sanes M.D.   On: 07/26/2021 13:07 I personally reviewed the CT images.  There is a 6 cm fluid collection in the lower lobe.  Impression: Sara Coffey is a 70 year old woman with a history of tobacco abuse, lung cancer, hyperlipidemia, osteoporosis, syncope with transient global amnesia, TIA, and colon cancer.  As part of her colon cancer follow-up she had a CT of the chest.  She was found to have a groundglass opacity.  On follow-up scans the nodule increased in size slightly developed a small solid component.  She had a robotic assisted left lower lobe superior segmentectomy with conversion to thoracotomy in August 2022.  Postoperative course was complicated by intra-alveolar hemorrhage in the lower lobe.  Over time she has been making progress, but her chest x-ray showed persistent opacity at the left base.  We did a CT to  better assess that area.  The CT shows a rounded fluid collection in the left lower lobe.  This most likely represents a liquefied parenchymal hematoma.  She does not have any evidence of infection to suggest lung abscess.  I do not think there is anything further to do about that at this time.  If she were to develop fevers or chills or productive cough or hemoptysis we may need to try to drain that area.  Plan: Follow-up as scheduled with Dr. Delton Coombes I will plan to see her back in a couple of months after her next CT of the chest She knows to call if she has worsening respiratory symptoms, cough, or fevers or chills  I spent 10 minutes in review of records, images, and in consultation by telephone with Sara Coffey today. [name], MD [facility] [phone]

## 2021-08-07 DIAGNOSIS — K219 Gastro-esophageal reflux disease without esophagitis: Secondary | ICD-10-CM | POA: Diagnosis not present

## 2021-08-07 DIAGNOSIS — M810 Age-related osteoporosis without current pathological fracture: Secondary | ICD-10-CM | POA: Diagnosis not present

## 2021-08-07 DIAGNOSIS — Z85118 Personal history of other malignant neoplasm of bronchus and lung: Secondary | ICD-10-CM | POA: Diagnosis not present

## 2021-08-07 DIAGNOSIS — M1990 Unspecified osteoarthritis, unspecified site: Secondary | ICD-10-CM | POA: Diagnosis not present

## 2021-08-07 DIAGNOSIS — E785 Hyperlipidemia, unspecified: Secondary | ICD-10-CM | POA: Diagnosis not present

## 2021-08-07 DIAGNOSIS — Z85038 Personal history of other malignant neoplasm of large intestine: Secondary | ICD-10-CM | POA: Diagnosis not present

## 2021-08-07 DIAGNOSIS — Z809 Family history of malignant neoplasm, unspecified: Secondary | ICD-10-CM | POA: Diagnosis not present

## 2021-08-07 DIAGNOSIS — Z87891 Personal history of nicotine dependence: Secondary | ICD-10-CM | POA: Diagnosis not present

## 2021-08-07 DIAGNOSIS — Z8249 Family history of ischemic heart disease and other diseases of the circulatory system: Secondary | ICD-10-CM | POA: Diagnosis not present

## 2021-08-07 DIAGNOSIS — Z85828 Personal history of other malignant neoplasm of skin: Secondary | ICD-10-CM | POA: Diagnosis not present

## 2021-08-12 DIAGNOSIS — R300 Dysuria: Secondary | ICD-10-CM | POA: Diagnosis not present

## 2021-08-13 ENCOUNTER — Other Ambulatory Visit: Payer: Self-pay | Admitting: Thoracic Surgery (Cardiothoracic Vascular Surgery)

## 2021-08-13 DIAGNOSIS — R911 Solitary pulmonary nodule: Secondary | ICD-10-CM

## 2021-09-24 NOTE — Progress Notes (Signed)
Reviewed 04/01/21 by Dr. Delton Coombes and f/u scheduled at that time

## 2021-10-01 ENCOUNTER — Ambulatory Visit: Payer: Medicare HMO | Admitting: Thoracic Surgery (Cardiothoracic Vascular Surgery)

## 2021-10-02 ENCOUNTER — Inpatient Hospital Stay (HOSPITAL_COMMUNITY): Payer: Medicare HMO

## 2021-10-02 ENCOUNTER — Ambulatory Visit (HOSPITAL_COMMUNITY): Payer: Medicare HMO

## 2021-10-04 ENCOUNTER — Inpatient Hospital Stay (HOSPITAL_COMMUNITY): Payer: Medicare HMO | Attending: Hematology

## 2021-10-04 DIAGNOSIS — C182 Malignant neoplasm of ascending colon: Secondary | ICD-10-CM | POA: Insufficient documentation

## 2021-10-04 DIAGNOSIS — D649 Anemia, unspecified: Secondary | ICD-10-CM | POA: Insufficient documentation

## 2021-10-04 DIAGNOSIS — C7802 Secondary malignant neoplasm of left lung: Secondary | ICD-10-CM | POA: Diagnosis not present

## 2021-10-04 DIAGNOSIS — R911 Solitary pulmonary nodule: Secondary | ICD-10-CM

## 2021-10-04 DIAGNOSIS — C3490 Malignant neoplasm of unspecified part of unspecified bronchus or lung: Secondary | ICD-10-CM

## 2021-10-04 DIAGNOSIS — C189 Malignant neoplasm of colon, unspecified: Secondary | ICD-10-CM

## 2021-10-04 DIAGNOSIS — C3412 Malignant neoplasm of upper lobe, left bronchus or lung: Secondary | ICD-10-CM

## 2021-10-04 LAB — CBC WITH DIFFERENTIAL/PLATELET
Abs Immature Granulocytes: 0.01 10*3/uL (ref 0.00–0.07)
Basophils Absolute: 0.1 10*3/uL (ref 0.0–0.1)
Basophils Relative: 2 %
Eosinophils Absolute: 0.3 10*3/uL (ref 0.0–0.5)
Eosinophils Relative: 5 %
HCT: 37.7 % (ref 36.0–46.0)
Hemoglobin: 12.4 g/dL (ref 12.0–15.0)
Immature Granulocytes: 0 %
Lymphocytes Relative: 37 %
Lymphs Abs: 1.8 10*3/uL (ref 0.7–4.0)
MCH: 30.3 pg (ref 26.0–34.0)
MCHC: 32.9 g/dL (ref 30.0–36.0)
MCV: 92.2 fL (ref 80.0–100.0)
Monocytes Absolute: 0.3 10*3/uL (ref 0.1–1.0)
Monocytes Relative: 6 %
Neutro Abs: 2.4 10*3/uL (ref 1.7–7.7)
Neutrophils Relative %: 50 %
Platelets: 272 10*3/uL (ref 150–400)
RBC: 4.09 MIL/uL (ref 3.87–5.11)
RDW: 13.9 % (ref 11.5–15.5)
WBC: 4.8 10*3/uL (ref 4.0–10.5)
nRBC: 0 % (ref 0.0–0.2)

## 2021-10-04 LAB — COMPREHENSIVE METABOLIC PANEL
ALT: 15 U/L (ref 0–44)
AST: 19 U/L (ref 15–41)
Albumin: 4 g/dL (ref 3.5–5.0)
Alkaline Phosphatase: 74 U/L (ref 38–126)
Anion gap: 8 (ref 5–15)
BUN: 11 mg/dL (ref 8–23)
CO2: 24 mmol/L (ref 22–32)
Calcium: 9.4 mg/dL (ref 8.9–10.3)
Chloride: 105 mmol/L (ref 98–111)
Creatinine, Ser: 0.77 mg/dL (ref 0.44–1.00)
GFR, Estimated: >60 mL/min (ref >60)
Glucose, Bld: 89 mg/dL (ref 70–99)
Potassium: 3.8 mmol/L (ref 3.5–5.1)
Sodium: 137 mmol/L (ref 135–145)
Total Bilirubin: 0.4 mg/dL (ref 0.3–1.2)
Total Protein: 7 g/dL (ref 6.5–8.1)

## 2021-10-05 LAB — CEA: CEA: 1.9 ng/mL (ref 0.0–4.7)

## 2021-10-07 ENCOUNTER — Other Ambulatory Visit: Payer: Self-pay

## 2021-10-07 ENCOUNTER — Ambulatory Visit (HOSPITAL_COMMUNITY)
Admission: RE | Admit: 2021-10-07 | Discharge: 2021-10-07 | Disposition: A | Discharge status: Home / Self Care | Payer: Medicare HMO | Source: Ambulatory Visit | Attending: Hematology | Admitting: Hematology

## 2021-10-07 DIAGNOSIS — M47816 Spondylosis without myelopathy or radiculopathy, lumbar region: Secondary | ICD-10-CM | POA: Diagnosis not present

## 2021-10-07 DIAGNOSIS — C182 Malignant neoplasm of ascending colon: Secondary | ICD-10-CM | POA: Insufficient documentation

## 2021-10-07 DIAGNOSIS — I739 Peripheral vascular disease, unspecified: Secondary | ICD-10-CM | POA: Diagnosis not present

## 2021-10-07 DIAGNOSIS — K769 Liver disease, unspecified: Secondary | ICD-10-CM | POA: Diagnosis not present

## 2021-10-07 DIAGNOSIS — Z85038 Personal history of other malignant neoplasm of large intestine: Secondary | ICD-10-CM | POA: Diagnosis not present

## 2021-10-07 MED ORDER — IOHEXOL 300 MG/ML  SOLN
100.0000 mL | Freq: Once | INTRAMUSCULAR | Status: AC | PRN
  Administered 2021-10-07: 100 mL via INTRAVENOUS

## 2021-10-08 ENCOUNTER — Encounter: Payer: Self-pay | Admitting: Thoracic Surgery (Cardiothoracic Vascular Surgery)

## 2021-10-08 ENCOUNTER — Ambulatory Visit: Payer: Medicare HMO | Admitting: Thoracic Surgery (Cardiothoracic Vascular Surgery)

## 2021-10-08 VITALS — BP 126/76 | HR 78 | Resp 20 | Ht 64.0 in | Wt 141.6 lb

## 2021-10-08 DIAGNOSIS — Z902 Acquired absence of lung [part of]: Secondary | ICD-10-CM | POA: Diagnosis not present

## 2021-10-08 NOTE — Progress Notes (Signed)
[address]       [place] [address]             [phone]       HPI: Sara Coffey returns for scheduled follow-up visit  Sara Coffey is a 71 year old woman with a history of tobacco abuse, lung cancer, hyperlipidemia, osteoporosis, syncope, transient global amnesia, TIA, and colon cancer.  She was found to have a groundglass opacity in her left lower lobe in September 2020.  Over time that increased in size and developed a solid component.  I did a robotic assisted left lower lobe superior segmentectomy on 04/05/2021.  We did have to convert to thoracotomy.  She developed an intra-alveolar hemorrhage during the early postoperative period.  I last talked her in December.  That was a telephone visit.  She still had a frequent cough but felt like overall symptoms making some progress.  Her pain was better she was Lyrica 25 mg daily.  A CT of the chest showed a 6.7 x 5.6 cm fluid collection with air bronchograms in the left base.  She feels better.  She is not having any pain now.  Her respiratory status is improved.  She does still have a cough.  Less productive.  She was scheduled to have a CT of the chest abdomen pelvis unfortunately only the abdomen and pelvis was done.  Past Medical History:  Diagnosis Date   Amnesia 04/17/2019   Hemorrhoid 09/28/2013   Hyperlipidemia    Osteoporosis    Syncope and collapse    TGA (transient global amnesia) 04/18/2019   TIA (transient ischemic attack) 04/17/2019   Tobacco abuse     Current Outpatient Medications  Medication Sig Dispense Refill   acetaminophen (TYLENOL) 500 MG tablet Take 2 tablets (1,000 mg total) by mouth every 6 (six) hours as needed for mild pain, fever or headache. 30 tablet 0   Calcium Carb-Cholecalciferol (CALCIUM-VITAMIN D) 600-400 MG-UNIT TABS Take 1 tablet by mouth every other day.     calcium carbonate (TUMS - DOSED IN MG ELEMENTAL CALCIUM) 500 MG chewable tablet Chew 1 tablet by mouth as needed for  indigestion or heartburn.     Ferrous Sulfate (IRON) 325 (65 Fe) MG TABS Take 325 mg by mouth every other day. In the morning     Melatonin 10 MG TABS Take 10 mg by mouth at bedtime as needed (sleep).     Multiple Vitamins-Minerals (MULTIVITAMIN WITH MINERALS) tablet Take 1 tablet by mouth daily. Multivitamin for Women 50+     Omega-3 Fatty Acids (FISH OIL) 1000 MG CAPS Take 2,000 mg by mouth in the morning.     oxyCODONE (OXY IR/ROXICODONE) 5 MG immediate release tablet Take 1 tablet (5 mg total) by mouth every 6 (six) hours as needed for severe pain. 30 tablet 0   rosuvastatin (CRESTOR) 5 MG tablet Take 1 tablet (5 mg total) by mouth daily. 90 tablet 1   vitamin B-12 (CYANOCOBALAMIN) 1000 MCG tablet Take 2,000 mcg by mouth daily.     pregabalin (LYRICA) 25 MG capsule Take 1 capsule (25 mg total) by mouth 2 (two) times daily. 60 capsule 0   No current facility-administered medications for this visit.    Physical Exam BP 126/76 (BP Location: Right Arm, Patient Position: Sitting, Cuff Size: Normal)    Pulse 78    Resp 20    Ht 5\' 4"  (1.626 m)    Wt 141 lb 9.6 oz (64.2 kg)  SpO2 97%    BMI 24.71 kg/m  71 year old woman in no acute distress Alert and oriented x3 with no focal deficits Cardiac regular rate and rhythm Lungs diminished at left base but otherwise clear  Diagnostic Tests: I personally reviewed the CT images and compared them to her previous films.  The official reading is not yet completed.  There does appear to be a decrease in the size of the fluid collection/necrotic lung/liquefied hematoma at the left base.  Impression: Sara Coffey is a 71 year old woman with a history of tobacco abuse, lung cancer, hyperlipidemia, osteoporosis, syncope, transient global amnesia, TIA, and colon cancer.  She underwent a left lower lobe superior segmentectomy in August 2022.  I was complicated by intraparenchymal hemorrhage.  She is doing well currently.  Unfortunately her CT did not  include all the lungs.  At the left base there has been some improvement compared to her previous CT scan.  With her continued to improve clinically think there is any indication for intervening on that area.  Plan: Follow-up with Dr. Delton Coombes tomorrow I will plan to see her back in about 3 months with a PA and lateral chest x-ray.  [name], MD [facility] [phone]

## 2021-10-09 ENCOUNTER — Inpatient Hospital Stay (HOSPITAL_BASED_OUTPATIENT_CLINIC_OR_DEPARTMENT_OTHER): Payer: Medicare HMO | Admitting: Hematology

## 2021-10-09 ENCOUNTER — Other Ambulatory Visit: Payer: Self-pay

## 2021-10-09 VITALS — BP 122/62 | HR 75 | Temp 99.1°F | Resp 17 | Wt 140.2 lb

## 2021-10-09 DIAGNOSIS — R911 Solitary pulmonary nodule: Secondary | ICD-10-CM | POA: Diagnosis not present

## 2021-10-09 DIAGNOSIS — C182 Malignant neoplasm of ascending colon: Secondary | ICD-10-CM | POA: Diagnosis not present

## 2021-10-09 DIAGNOSIS — D649 Anemia, unspecified: Secondary | ICD-10-CM | POA: Diagnosis not present

## 2021-10-09 DIAGNOSIS — C189 Malignant neoplasm of colon, unspecified: Secondary | ICD-10-CM | POA: Diagnosis not present

## 2021-10-09 DIAGNOSIS — C7802 Secondary malignant neoplasm of left lung: Secondary | ICD-10-CM | POA: Diagnosis not present

## 2021-10-09 NOTE — Patient Instructions (Signed)
[place] at [facility] Discharge Instructions  You were seen and examined today by Dr. Delton Coombes. He reviewed your most recent labs and scan and everything looks good. Please keep follow up appointments as scheduled in 3 months.   Thank you for choosing [place] at [facility] to provide your oncology and hematology care.  To afford each patient quality time with our provider, please arrive at least 15 minutes before your scheduled appointment time.   If you have a lab appointment with the [facility] please come in thru the Main Entrance and check in at the main information desk.  You need to re-schedule your appointment should you arrive 10 or more minutes late.  We strive to give you quality time with our providers, and arriving late affects you and other patients whose appointments are after yours.  Also, if you no show three or more times for appointments you may be dismissed from the clinic at the providers discretion.     Again, thank you for choosing [facility].  Our hope is that these requests will decrease the amount of time that you wait before being seen by our physicians.       _____________________________________________________________  Should you have questions after your visit to [facility], please contact our office at [phone] and follow the prompts.  Our office hours are 8:00 a.m. and 4:30 p.m. Monday - Friday.  Please note that voicemails left after 4:00 p.m. may not be returned until the following business day.  We are closed weekends and major holidays.  You do have access to a nurse 24-7, just call the main number to the clinic [phone] and do not press any options, hold on the line and a nurse will answer the phone.    For prescription refill requests, have your pharmacy contact our office and allow 72 hours.    Due to Covid, you will need to wear a mask upon entering the hospital.  If you do not have a mask, a mask will be given to you at the Main Entrance upon arrival. For doctor visits, patients may have 1 support person age 35 or older with them. For treatment visits, patients can not have anyone with them due to social distancing guidelines and our immunocompromised population.

## 2021-10-09 NOTE — Progress Notes (Signed)
Sara Coffey, [place] [address]   CLINIC:  Medical Oncology/Hematology  PCP:  Jamey Ripa [facility] [employer], [address] / [place] [address] [phone]   REASON FOR VISIT:  Follow-up for colon cancer  PRIOR THERAPY: Right hemicolectomy on 04/09/2020  NGS Results: [employer] MSI--high; BRAF V600E; MLH1 hyper methylation positive  CURRENT THERAPY: Surveillance  BRIEF ONCOLOGIC HISTORY:  Oncology History  Cancer of ascending colon ([place])  05/10/2020 Genetic Testing   BRAF Mutation Analysis:     05/11/2020 Genetic Testing   MLH1 Promoter Methylation Analysis:       CANCER STAGING: Cancer Staging  No matching staging information was found for the patient.  INTERVAL HISTORY:  Sara Coffey, a 71 y.o. female, returns for routine follow-up of her colon cancer. Randall was last seen on 11/21/2020.   Today she reports feeling good. She reports regular BM and denies hematochezia. She reports occasional SOB with exertion, but otherwise reports her breathing is at baseline. She is walking 2.5 miles every other day. She reports her cough has improved.    REVIEW OF SYSTEMS:  Review of Systems  Constitutional:  Negative for appetite change and fatigue.  Respiratory:  Negative for shortness of breath. Cough: improved.  Gastrointestinal:  Negative for blood in stool, constipation and diarrhea.  Psychiatric/Behavioral:  Positive for depression.   All other systems reviewed and are negative.  PAST MEDICAL/SURGICAL HISTORY:  Past Medical History:  Diagnosis Date   Amnesia 04/17/2019   Hemorrhoid 09/28/2013   Hyperlipidemia    Osteoporosis    Syncope and collapse    TGA (transient global amnesia) 04/18/2019   TIA (transient ischemic attack) 04/17/2019   Tobacco abuse    Past Surgical History:  Procedure Laterality Date   BIOPSY  03/02/2020   Procedure: BIOPSY;  Surgeon: Rogene Houston, MD;  [facility];  Service: Endoscopy;;  ascending colon mass   COLON SURGERY     COLONOSCOPY WITH PROPOFOL N/A 03/02/2020   Procedure: COLONOSCOPY WITH PROPOFOL;  Surgeon: Rogene Houston, MD;  [facility];  Service: Endoscopy;  Laterality: N/A;  1055   COLONOSCOPY WITH PROPOFOL N/A 01/09/2021   Procedure: COLONOSCOPY WITH PROPOFOL;  Surgeon: Rogene Houston, MD;  [facility];  Service: Endoscopy;  Laterality: N/A;  [place] BLOCK  04/05/2021   Procedure: INTERCOSTAL NERVE BLOCK;  Surgeon: [name], MD;  Location: [facility];  Service: Thoracic;;   KNEE SX     LAPAROSCOPIC PARTIAL COLECTOMY N/A 04/09/2020   Procedure: LAPAROSCOPIC PARTIAL COLECTOMY;  Surgeon: Virl Cagey, MD;  Location: [facility];  Service: General;  Laterality: N/A;   left eye surgery      LYMPH NODE DISSECTION Left 04/05/2021   Procedure: LYMPH NODE DISSECTION;  Surgeon: [name], MD;  Location: [facility];  Service: Thoracic;  Laterality: Left;   POLYPECTOMY  03/02/2020   Procedure: POLYPECTOMY;  Surgeon: Rogene Houston, MD;  [facility];  Service: Endoscopy;;   POLYPECTOMY  01/09/2021   Procedure: POLYPECTOMY;  Surgeon: Rogene Houston, MD;  [facility];  Service: Endoscopy;;   THORACOTOMY  04/05/2021   Procedure: THORACOTOMY MAJOR;  Surgeon: [name], MD;  Location: [facility];  Service: Thoracic;;   TUBAL LIGATION     VIDEO ASSISTED THORACOSCOPY Left 04/10/2021   Procedure: VIDEO ASSISTED THORACOSCOPY, EVACUATION OF HEMOTHORAX, LEFT LOWER LOBE WEDGE RESECTION;  Surgeon: [name], MD;  Location: [facility] OR;  Service: Thoracic;  Laterality: Left;   XI ROBOTIC ASSISTED THORACOSCOPY- SEGMENTECTOMY Left 04/05/2021   Procedure: XI ROBOTIC ASSISTED THORACOSCOPY-LEFT LOWER LOBE SUPERIOR SEGMENTECTOMY;  Surgeon: [name], MD;  Location: [facility];  Service: Thoracic;  Laterality: Left;    SOCIAL HISTORY:  Social History    Socioeconomic History   Marital status: Married    Spouse name: Not on file   Number of children: 3   Years of education: Not on file   Highest education level: Not on file  Occupational History   Occupation: retired  Tobacco Use   Smoking status: Former    Packs/day: 1.00    Years: 40.00    Pack years: 40.00    Types: Cigarettes    Quit date: 03/28/2021    Years since quitting: 0.5   Smokeless tobacco: Never  Vaping Use   Vaping Use: Never used  Substance and Sexual Activity   Alcohol use: No   Drug use: No   Sexual activity: Not Currently    Birth control/protection: Surgical, Post-menopausal    Comment: tubal  Other Topics Concern   Not on file  Social History Narrative   Not on file   Social Determinants of Health   Financial Resource Strain: Not on file  Food Insecurity: Not on file  Transportation Needs: Not on file  Physical Activity: Not on file  Stress: Not on file  Social Connections: Not on file  Intimate Partner Violence: Not on file    FAMILY HISTORY:  Family History  Problem Relation Age of Onset   Heart disease Father    Hypertension Father    Diabetes Father    Cancer Brother    Healthy Son    Healthy Son    Healthy Daughter    Ulcers Mother    Kidney failure Maternal Grandfather    Throat cancer Paternal Grandfather    Cancer Brother    Breast cancer Neg Hx     CURRENT MEDICATIONS:  Current Outpatient Medications  Medication Sig Dispense Refill   acetaminophen (TYLENOL) 500 MG tablet Take 2 tablets (1,000 mg total) by mouth every 6 (six) hours as needed for mild pain, fever or headache. 30 tablet 0   Calcium Carb-Cholecalciferol (CALCIUM-VITAMIN D) 600-400 MG-UNIT TABS Take 1 tablet by mouth every other day.     calcium carbonate (TUMS - DOSED IN MG ELEMENTAL CALCIUM) 500 MG chewable tablet Chew 1 tablet by mouth as needed for indigestion or heartburn.     Ferrous Sulfate (IRON) 325 (65 Fe) MG TABS Take 325 mg by mouth every other  day. In the morning     Melatonin 10 MG TABS Take 10 mg by mouth at bedtime as needed (sleep).     Multiple Vitamins-Minerals (MULTIVITAMIN WITH MINERALS) tablet Take 1 tablet by mouth daily. Multivitamin for Women 50+     Omega-3 Fatty Acids (FISH OIL) 1000 MG CAPS Take 2,000 mg by mouth in the morning.     oxyCODONE (OXY IR/ROXICODONE) 5 MG immediate release tablet Take 1 tablet (5 mg total) by mouth every 6 (six) hours as needed for severe pain. 30 tablet 0   pregabalin (LYRICA) 25 MG capsule Take 1 capsule (25 mg total) by mouth 2 (two) times daily. 60 capsule 0   rosuvastatin (CRESTOR) 5 MG tablet Take 1 tablet (5 mg total) by mouth daily. 90 tablet 1   vitamin B-12 (CYANOCOBALAMIN) 1000 MCG tablet Take 2,000 mcg by  mouth daily.     No current facility-administered medications for this visit.    ALLERGIES:  No Known Allergies  PHYSICAL EXAM:  Performance status (ECOG): 0 - Asymptomatic  There were no vitals filed for this visit. Wt Readings from Last 3 Encounters:  10/08/21 141 lb 9.6 oz (64.2 kg)  06/25/21 140 lb (63.5 kg)  05/27/21 139 lb (63 kg)   Physical Exam Vitals reviewed.  Constitutional:      Appearance: Normal appearance.  Cardiovascular:     Rate and Rhythm: Normal rate and regular rhythm.     Pulses: Normal pulses.     Heart sounds: Normal heart sounds.  Pulmonary:     Effort: Pulmonary effort is normal.     Breath sounds: Normal breath sounds.  Abdominal:     Palpations: Abdomen is soft. There is no hepatomegaly, splenomegaly or mass.     Tenderness: There is no abdominal tenderness.  Neurological:     General: No focal deficit present.     Mental Status: She is alert and oriented to person, place, and time.  Psychiatric:        Mood and Affect: Mood normal.        Behavior: Behavior normal.     LABORATORY DATA:  I have reviewed the labs as listed.  CBC Latest Ref Rng & Units 10/04/2021 05/27/2021 04/13/2021  WBC 4.0 - 10.5 K/uL 4.8 4.7 8.7   Hemoglobin 12.0 - 15.0 g/dL 12.4 12.3 8.0(L)  Hematocrit 36.0 - 46.0 % 37.7 39.6 22.9(L)  Platelets 150 - 400 K/uL 272 346 439(H)   CMP Latest Ref Rng & Units 10/04/2021 05/27/2021 04/11/2021  Glucose 70 - 99 mg/dL 89 75 149(H)  BUN 8 - 23 mg/dL _0 Creatinine 0.44 - 1.00 mg/dL 0.77 0.75 0.87  Sodium 135 - 145 mmol/L 137 141 132(L)  Potassium 3.5 - 5.1 mmol/L 3.8 5.5(H) 4.7  Chloride 98 - 111 mmol/L 105 106 99  CO2 22 - 32 mmol/L 24 32 26  Calcium 8.9 - 10.3 mg/dL 9.4 10.0 7.9(L)  Total Protein 6.5 - 8.1 g/dL 7.0 6.9 -  Total Bilirubin 0.3 - 1.2 mg/dL 0.4 0.4 -  Alkaline Phos 38 - 126 U/L 74 - -  AST 15 - 41 U/L 19 15 -  ALT 0 - 44 U/L 15 11 -    DIAGNOSTIC IMAGING:  I have independently reviewed the scans and discussed with the patient. CT Abdomen Pelvis W Contrast  Result Date: 10/09/2021 CLINICAL DATA:  Patient with history of colon cancer. Follow-up exam. EXAM: CT ABDOMEN AND PELVIS WITH CONTRAST TECHNIQUE: Multidetector CT imaging of the abdomen and pelvis was performed using the standard protocol following bolus administration of intravenous contrast. RADIATION DOSE REDUCTION: This exam was performed according to the departmental dose-optimization program which includes automated exposure control, adjustment of the mA and/or kV according to patient size and/or use of iterative reconstruction technique. CONTRAST:  128m OMNIPAQUE IOHEXOL 300 MG/ML  SOLN COMPARISON:  Chest CT 07/25/2021; PET-CT 03/27/2021 FINDINGS: Lower chest: Partially visualized postsurgical changes within the left lower hemithorax. Interval decrease in size of gas and fluid collection measuring 3.4 x 4.0 cm, previously 6.7 x 5.6 cm. Small adjacent pleural effusion within the left lower hemithorax. Normal heart size. Hepatobiliary: The liver is normal in size and contour. Stable 10 mm lesion within the left hepatic lobe, likely representing a small cyst. Gallbladder is unremarkable. No intrahepatic or extrahepatic  biliary ductal dilatation. Pancreas: Unremarkable Spleen: Unremarkable Adrenals/Urinary Tract:  Normal adrenal glands. Kidneys enhance symmetrically with contrast. Stable small low-attenuation lesions within the kidneys bilaterally, too small to characterize however favored to represent small cysts. No hydronephrosis. Urinary bladder is unremarkable. Stomach/Bowel: Oral contrast material to the level of the rectum. No abnormal bowel wall thickening or evidence for bowel obstruction. Ascending colectomy. Stable postsurgical changes. No free fluid or free intraperitoneal air. Vascular/Lymphatic: Normal caliber abdominal aorta. Peripheral calcified atherosclerotic plaque. No retroperitoneal lymphadenopathy. Reproductive: Uterus and adnexal structures are unremarkable. Other: None. Musculoskeletal: Lumbar spine degenerative changes. Chronic appearing fracture involving the posterolateral left ninth rib (image 15; series 2). IMPRESSION: 1. Interval decrease in size of collection within the left lower hemithorax when compared to prior chest CT. Persistent small left pleural effusion. 2. Stable postsurgical changes involving the colon. No evidence for localized recurrence or metastasis. Electronically Signed   By: Lovey Newcomer M.D.   On: 10/09/2021 09:10     ASSESSMENT:  1.  Stage II (T3N0) right colon adenocarcinoma: -Colonoscopy on 03/02/2020 biopsy of the ascending colon mass consistent with adenocarcinoma.  Sessile serrated polyp in the hepatic flexure, tubular adenoma in the hepatic, transverse and proximal colon.  Tubular adenoma in the rectum. -CTAP on 03/14/2020 shows 4.6 cm intraluminal mass in the right colon consistent with known colon cancer.  No evidence of metastatic disease in the abdomen or pelvis. -Preoperative CEA on 04/06/2020 was 3.6. -Right hemicolectomy on 04/09/2020 with pathology showing 3.5 cm moderately differentiated adenocarcinoma, separate focus of invasive adenocarcinoma 0.5 cm arising in a  tubular adenoma with high-grade dysplasia, 0/32 lymph nodes involved, no perforation, no lymphovascular/perineural invasion, margins negative, PT3PN0. -MMR shows loss of nuclear expression of MLH1 and PMS2.  MSI was high. -BRAF V600 and MLH1 hypermethylation positive.  Indicates sporadic nature. -CT chest with contrast on 05/15/2020 with no metastatic disease.  3 mm pulmonary nodule of the dependent right lower lobe and 7 mm groundglass opacity of the dependent superior segment of the left lower lobe.   2.  Social/family history: -She is a retired Psychiatric nurse. -Current active smoker, 1 pack/day for 32 years. -One brother had prostate cancer.  Another brother had multiple different type of cancers, known to the patient.  Paternal grandfather had throat cancer and a maternal aunt had stomach cancer.   PLAN:  1.  Stage II (T3N0) right colon adenocarcinoma: - She does not have any change in bowel movements or bleeding per rectum. - Reviewed labs from 10/04/2021 which showed normal LFTs and CBC.  CEA was 1.9. - Reviewed CTAP from 10/09/2021 which showed no evidence of recurrence of colon cancer or metastatic disease.  Interval decrease in size of collection within the left lower hemithorax when compared to prior CT from December 2022. - We will follow her in 3 months with repeat CEA.  We will do follow-up visits every 3 months until August of this year and switch her to every 32-month visits.   2.  Normocytic anemia: - Labs from 10/04/2021 were reviewed which showed normal hemoglobin 12.4.  MCV was 92.   3.  Stage I (T1AN0) adenocarcinoma of the left lower lobe of the lung: - Pathology on 04/05/2021 showed invasive adenocarcinoma, 0.7 cm, margins negative.  Lymph nodes from station 7, 9, 10, 4L, 8, 12, 5, 11, 13 were negative. - Given the size of the tumor, no indication for adjuvant therapy. - CT of the chest on 08/05/2021 showed postoperative changes in the left lower lobe wedge resection site  with small complex left pleural effusion.  Rounded 6 cm fluid collection in the left lower lobe containing few air bronchograms and scattered air collections.  No mediastinal or hilar masses or adenopathy. - We will plan to arrange for follow-up CT scan of the chest in 3 months.   Orders placed this encounter:  No orders of the defined types were placed in this encounter.    Derek Jack, MD [place] [phone]   I, Thana Ates, am acting as a scribe for Dr. Derek Jack.  I, Derek Jack MD, have reviewed the above documentation for accuracy and completeness, and I agree with the above.

## 2021-10-10 DIAGNOSIS — M818 Other osteoporosis without current pathological fracture: Secondary | ICD-10-CM | POA: Diagnosis not present

## 2021-10-10 DIAGNOSIS — E782 Mixed hyperlipidemia: Secondary | ICD-10-CM | POA: Diagnosis not present

## 2021-10-10 DIAGNOSIS — D649 Anemia, unspecified: Secondary | ICD-10-CM | POA: Diagnosis not present

## 2021-10-10 DIAGNOSIS — C3432 Malignant neoplasm of lower lobe, left bronchus or lung: Secondary | ICD-10-CM | POA: Diagnosis not present

## 2021-10-10 DIAGNOSIS — C189 Malignant neoplasm of colon, unspecified: Secondary | ICD-10-CM | POA: Diagnosis not present

## 2021-10-10 DIAGNOSIS — Z8673 Personal history of transient ischemic attack (TIA), and cerebral infarction without residual deficits: Secondary | ICD-10-CM | POA: Diagnosis not present

## 2021-10-14 DIAGNOSIS — C44529 Squamous cell carcinoma of skin of other part of trunk: Secondary | ICD-10-CM | POA: Diagnosis not present

## 2021-10-14 DIAGNOSIS — L989 Disorder of the skin and subcutaneous tissue, unspecified: Secondary | ICD-10-CM | POA: Diagnosis not present

## 2021-10-27 ENCOUNTER — Other Ambulatory Visit: Payer: Self-pay | Admitting: Nurse Practitioner

## 2021-10-27 DIAGNOSIS — E785 Hyperlipidemia, unspecified: Secondary | ICD-10-CM

## 2021-12-31 ENCOUNTER — Ambulatory Visit (HOSPITAL_COMMUNITY)
Admission: RE | Admit: 2021-12-31 | Discharge: 2021-12-31 | Disposition: A | Discharge status: Home / Self Care | Payer: Medicare HMO | Source: Ambulatory Visit | Attending: Hematology | Admitting: Hematology

## 2021-12-31 ENCOUNTER — Inpatient Hospital Stay (HOSPITAL_COMMUNITY): Payer: Medicare HMO | Attending: Hematology

## 2021-12-31 DIAGNOSIS — F17210 Nicotine dependence, cigarettes, uncomplicated: Secondary | ICD-10-CM | POA: Insufficient documentation

## 2021-12-31 DIAGNOSIS — J984 Other disorders of lung: Secondary | ICD-10-CM | POA: Diagnosis not present

## 2021-12-31 DIAGNOSIS — C3432 Malignant neoplasm of lower lobe, left bronchus or lung: Secondary | ICD-10-CM | POA: Diagnosis not present

## 2021-12-31 DIAGNOSIS — R911 Solitary pulmonary nodule: Secondary | ICD-10-CM | POA: Insufficient documentation

## 2021-12-31 DIAGNOSIS — C189 Malignant neoplasm of colon, unspecified: Secondary | ICD-10-CM

## 2021-12-31 DIAGNOSIS — R69 Illness, unspecified: Secondary | ICD-10-CM | POA: Diagnosis not present

## 2021-12-31 DIAGNOSIS — C182 Malignant neoplasm of ascending colon: Secondary | ICD-10-CM | POA: Diagnosis not present

## 2021-12-31 DIAGNOSIS — I2510 Atherosclerotic heart disease of native coronary artery without angina pectoris: Secondary | ICD-10-CM | POA: Diagnosis not present

## 2021-12-31 DIAGNOSIS — R918 Other nonspecific abnormal finding of lung field: Secondary | ICD-10-CM | POA: Diagnosis not present

## 2021-12-31 DIAGNOSIS — I700 Atherosclerosis of aorta: Secondary | ICD-10-CM | POA: Diagnosis not present

## 2021-12-31 LAB — CBC WITH DIFFERENTIAL/PLATELET
Abs Immature Granulocytes: 0.01 10*3/uL (ref 0.00–0.07)
Basophils Absolute: 0.1 10*3/uL (ref 0.0–0.1)
Basophils Relative: 2 %
Eosinophils Absolute: 0.2 10*3/uL (ref 0.0–0.5)
Eosinophils Relative: 4 %
HCT: 38.8 % (ref 36.0–46.0)
Hemoglobin: 12.9 g/dL (ref 12.0–15.0)
Immature Granulocytes: 0 %
Lymphocytes Relative: 41 %
Lymphs Abs: 2 10*3/uL (ref 0.7–4.0)
MCH: 30.2 pg (ref 26.0–34.0)
MCHC: 33.2 g/dL (ref 30.0–36.0)
MCV: 90.9 fL (ref 80.0–100.0)
Monocytes Absolute: 0.3 10*3/uL (ref 0.1–1.0)
Monocytes Relative: 6 %
Neutro Abs: 2.3 10*3/uL (ref 1.7–7.7)
Neutrophils Relative %: 47 %
Platelets: 273 10*3/uL (ref 150–400)
RBC: 4.27 MIL/uL (ref 3.87–5.11)
RDW: 13.2 % (ref 11.5–15.5)
WBC: 4.9 10*3/uL (ref 4.0–10.5)
nRBC: 0 % (ref 0.0–0.2)

## 2021-12-31 LAB — COMPREHENSIVE METABOLIC PANEL
ALT: 16 U/L (ref 0–44)
AST: 20 U/L (ref 15–41)
Albumin: 4.1 g/dL (ref 3.5–5.0)
Alkaline Phosphatase: 77 U/L (ref 38–126)
Anion gap: 6 (ref 5–15)
BUN: 11 mg/dL (ref 8–23)
CO2: 27 mmol/L (ref 22–32)
Calcium: 9.5 mg/dL (ref 8.9–10.3)
Chloride: 105 mmol/L (ref 98–111)
Creatinine, Ser: 0.77 mg/dL (ref 0.44–1.00)
GFR, Estimated: >60 mL/min (ref >60)
Glucose, Bld: 119 mg/dL — ABNORMAL HIGH (ref 70–99)
Potassium: 3.8 mmol/L (ref 3.5–5.1)
Sodium: 138 mmol/L (ref 135–145)
Total Bilirubin: 0.4 mg/dL (ref 0.3–1.2)
Total Protein: 7.2 g/dL (ref 6.5–8.1)

## 2021-12-31 MED ORDER — IOHEXOL 300 MG/ML  SOLN
80.0000 mL | Freq: Once | INTRAMUSCULAR | Status: AC | PRN
  Administered 2021-12-31: 80 mL via INTRAVENOUS

## 2022-01-01 DIAGNOSIS — Z08 Encounter for follow-up examination after completed treatment for malignant neoplasm: Secondary | ICD-10-CM | POA: Diagnosis not present

## 2022-01-01 DIAGNOSIS — Z789 Other specified health status: Secondary | ICD-10-CM | POA: Diagnosis not present

## 2022-01-01 DIAGNOSIS — L910 Hypertrophic scar: Secondary | ICD-10-CM | POA: Diagnosis not present

## 2022-01-01 DIAGNOSIS — Z85828 Personal history of other malignant neoplasm of skin: Secondary | ICD-10-CM | POA: Diagnosis not present

## 2022-01-01 DIAGNOSIS — L905 Scar conditions and fibrosis of skin: Secondary | ICD-10-CM | POA: Diagnosis not present

## 2022-01-01 LAB — CEA: CEA: 1.8 ng/mL (ref 0.0–4.7)

## 2022-01-06 ENCOUNTER — Other Ambulatory Visit: Payer: Self-pay | Admitting: Thoracic Surgery (Cardiothoracic Vascular Surgery)

## 2022-01-06 DIAGNOSIS — R911 Solitary pulmonary nodule: Secondary | ICD-10-CM

## 2022-01-07 ENCOUNTER — Ambulatory Visit: Payer: Medicare HMO | Admitting: Thoracic Surgery (Cardiothoracic Vascular Surgery)

## 2022-01-07 ENCOUNTER — Ambulatory Visit
Admission: RE | Admit: 2022-01-07 | Discharge: 2022-01-07 | Disposition: A | Discharge status: Home / Self Care | Payer: Medicare HMO | Source: Ambulatory Visit | Attending: Thoracic Surgery (Cardiothoracic Vascular Surgery) | Admitting: Thoracic Surgery (Cardiothoracic Vascular Surgery)

## 2022-01-07 VITALS — BP 125/71 | HR 76 | Resp 20 | Ht 64.0 in | Wt 140.0 lb

## 2022-01-07 DIAGNOSIS — R911 Solitary pulmonary nodule: Secondary | ICD-10-CM

## 2022-01-07 DIAGNOSIS — Z902 Acquired absence of lung [part of]: Secondary | ICD-10-CM | POA: Diagnosis not present

## 2022-01-07 NOTE — Progress Notes (Signed)
[address]       [place] [address]             [phone]     HPI: Mrs. Sara Coffey returns for a scheduled follow-up visit  Sara Coffey is a 71 year old woman with a history of tobacco abuse, lung cancer, hyperlipidemia, osteoporosis, syncope, TIA, transient global ischemia, and colon cancer.  She was initially found to have a groundglass opacity in the left lower lobe in September 2020.  Over time that increased in size and developed a solid component.  She underwent a robotic assisted left lower lobe superior segmentectomy on 04/05/2021.  She required conversion to thoracotomy and developed an intra-alveolar hemorrhage in the postoperative period.  The nodule did turn out to be a stage Ia adenocarcinoma.  I last saw her in February.  She had a CT of the abdomen and pelvis which showed persistent opacity at the left base.  She now returns with a follow-up CT.  She is feeling well.  She occasionally has some cough and congestion but no shortness of breath.  No residual pain.  Past Medical History:  Diagnosis Date   Amnesia 04/17/2019   Hemorrhoid 09/28/2013   Hyperlipidemia    Osteoporosis    Syncope and collapse    TGA (transient global amnesia) 04/18/2019   TIA (transient ischemic attack) 04/17/2019   Tobacco abuse     Current Outpatient Medications  Medication Sig Dispense Refill   acetaminophen (TYLENOL) 500 MG tablet Take 2 tablets (1,000 mg total) by mouth every 6 (six) hours as needed for mild pain, fever or headache. 30 tablet 0   Calcium Carb-Cholecalciferol (CALCIUM-VITAMIN D) 600-400 MG-UNIT TABS Take 1 tablet by mouth every other day.     calcium carbonate (TUMS - DOSED IN MG ELEMENTAL CALCIUM) 500 MG chewable tablet Chew 1 tablet by mouth as needed for indigestion or heartburn.     Ferrous Sulfate (IRON) 325 (65 Fe) MG TABS Take 325 mg by mouth every other day. In the morning     Melatonin 10 MG TABS Take 10 mg by mouth at bedtime as needed (sleep).      Multiple Vitamins-Minerals (MULTIVITAMIN WITH MINERALS) tablet Take 1 tablet by mouth daily. Multivitamin for Women 50+     Omega-3 Fatty Acids (FISH OIL) 1000 MG CAPS Take 2,000 mg by mouth in the morning.     oxyCODONE (OXY IR/ROXICODONE) 5 MG immediate release tablet Take 1 tablet (5 mg total) by mouth every 6 (six) hours as needed for severe pain. 30 tablet 0   rosuvastatin (CRESTOR) 5 MG tablet Take 1 tablet (5 mg total) by mouth daily. 90 tablet 1   vitamin B-12 (CYANOCOBALAMIN) 1000 MCG tablet Take 2,000 mcg by mouth daily.     pregabalin (LYRICA) 25 MG capsule Take 1 capsule (25 mg total) by mouth 2 (two) times daily. 60 capsule 0   No current facility-administered medications for this visit.    Physical Exam BP 125/71 (BP Location: Left Arm, Patient Position: Sitting)   Pulse 76   Resp 20   Ht 5\' 4"  (1.626 m)   Wt 140 lb (63.5 kg)   SpO2 97% Comment: RA  BMI 24.30 kg/m  71 year old woman in no acute distress Alert and oriented x3 with no deficits Well-developed and well-nourished Lungs diminished at left base but otherwise clear Cardiac regular rate and rhythm  Diagnostic Tests: CT CHEST WITH CONTRAST   TECHNIQUE: Multidetector CT imaging of the chest was  performed during intravenous contrast administration.   RADIATION DOSE REDUCTION: This exam was performed according to the departmental dose-optimization program which includes automated exposure control, adjustment of the mA and/or kV according to patient size and/or use of iterative reconstruction technique.   CONTRAST:  28mL OMNIPAQUE IOHEXOL 300 MG/ML  SOLN   COMPARISON:  07/25/2021   FINDINGS: Cardiovascular: No acute findings. Aortic and coronary atherosclerotic calcification incidentally noted.   Mediastinum/Nodes: No masses or pathologically enlarged lymph nodes identified.   Lungs/Pleura: Postop changes again seen from left lower lobe wedge resection. Pleural-parenchymal scarring is again seen  in the posterior left lower lobe. An intraparenchymal fluid collection is seen in the posterior left lower lobe, which no longer contains gas and is significantly decreased in size. This currently measures 3.0 x 2.9 cm compared to 6.7 x 5.6 cm previously. No suspicious pulmonary nodules or masses are identified. Right lung is clear.   Upper Abdomen: Stable small cysts in the left hepatic lobe and upper pole of the right kidney.   Musculoskeletal:  No suspicious bone lesions.   IMPRESSION: Significant decrease in size of intraparenchymal fluid collection in posterior left lower lobe.   Stable left lower lobe pleural-parenchymal scarring.   No evidence of recurrent or metastatic carcinoma within the thorax.   Aortic Atherosclerosis (ICD10-I70.0).     Electronically Signed   By: Marlaine Hind M.D.   On: 01/01/2022 16:53   I personally reviewed the CT images.  There has been significant improvement in the parenchymal opacity/fluid collection in the interval since her last CT.  Impression: Sara Coffey is a 71 year old woman who had a robotic left lower lobe superior segmentectomy with conversion to thoracotomy for stage Ia adenocarcinoma about 9 months ago.  Postoperative course was complicated by intraparenchymal hemorrhage and hemothorax.  She is now 9 months out from surgery.  She is doing well.  She is not having any respiratory issues of note.  The residual intraparenchymal fluid collection/opacity has improved dramatically over the past 6 months.  There is no indication for any type of intervention.  Plan: Follow-up with Dr. Delton Coombes I will be happy to see Sara Coffey back anytime if I can be of any further assistance with her care  [name], MD [facility] [phone]

## 2022-01-08 DIAGNOSIS — D649 Anemia, unspecified: Secondary | ICD-10-CM | POA: Diagnosis not present

## 2022-01-08 DIAGNOSIS — I700 Atherosclerosis of aorta: Secondary | ICD-10-CM | POA: Diagnosis not present

## 2022-01-08 DIAGNOSIS — S20461A Insect bite (nonvenomous) of right back wall of thorax, initial encounter: Secondary | ICD-10-CM | POA: Diagnosis not present

## 2022-01-08 DIAGNOSIS — M818 Other osteoporosis without current pathological fracture: Secondary | ICD-10-CM | POA: Diagnosis not present

## 2022-01-08 DIAGNOSIS — Z85038 Personal history of other malignant neoplasm of large intestine: Secondary | ICD-10-CM | POA: Diagnosis not present

## 2022-01-08 DIAGNOSIS — Z85828 Personal history of other malignant neoplasm of skin: Secondary | ICD-10-CM | POA: Diagnosis not present

## 2022-01-08 DIAGNOSIS — Z85118 Personal history of other malignant neoplasm of bronchus and lung: Secondary | ICD-10-CM | POA: Diagnosis not present

## 2022-01-08 DIAGNOSIS — Z13820 Encounter for screening for osteoporosis: Secondary | ICD-10-CM | POA: Diagnosis not present

## 2022-01-08 DIAGNOSIS — E782 Mixed hyperlipidemia: Secondary | ICD-10-CM | POA: Diagnosis not present

## 2022-01-08 DIAGNOSIS — W57XXXA Bitten or stung by nonvenomous insect and other nonvenomous arthropods, initial encounter: Secondary | ICD-10-CM | POA: Diagnosis not present

## 2022-01-08 DIAGNOSIS — Z0000 Encounter for general adult medical examination without abnormal findings: Secondary | ICD-10-CM | POA: Diagnosis not present

## 2022-01-08 DIAGNOSIS — G459 Transient cerebral ischemic attack, unspecified: Secondary | ICD-10-CM | POA: Diagnosis not present

## 2022-01-09 ENCOUNTER — Inpatient Hospital Stay (HOSPITAL_BASED_OUTPATIENT_CLINIC_OR_DEPARTMENT_OTHER): Payer: Medicare HMO | Admitting: Hematology

## 2022-01-09 ENCOUNTER — Other Ambulatory Visit: Payer: Self-pay | Admitting: Internal Medicine

## 2022-01-09 VITALS — BP 116/76 | HR 72 | Temp 97.5°F | Resp 18 | Ht 64.0 in | Wt 139.5 lb

## 2022-01-09 DIAGNOSIS — M810 Age-related osteoporosis without current pathological fracture: Secondary | ICD-10-CM

## 2022-01-09 DIAGNOSIS — C189 Malignant neoplasm of colon, unspecified: Secondary | ICD-10-CM

## 2022-01-09 DIAGNOSIS — C3432 Malignant neoplasm of lower lobe, left bronchus or lung: Secondary | ICD-10-CM | POA: Diagnosis not present

## 2022-01-09 DIAGNOSIS — R69 Illness, unspecified: Secondary | ICD-10-CM | POA: Diagnosis not present

## 2022-01-09 DIAGNOSIS — R911 Solitary pulmonary nodule: Secondary | ICD-10-CM | POA: Diagnosis not present

## 2022-01-09 DIAGNOSIS — C182 Malignant neoplasm of ascending colon: Secondary | ICD-10-CM | POA: Diagnosis not present

## 2022-01-09 NOTE — Patient Instructions (Signed)
[place] at [facility] Discharge Instructions  You were seen and examined today by Dr. Delton Coombes.  Dr. Delton Coombes discussed your most recent lab work and CT scan which revealed that everything looks good.  Dr. Delton Coombes will get you scheduled for CT abdomen and pelvis before your next appointment and CT chest in 6 months.  Follow-up as scheduled in 3 months.    Thank you for choosing [place] at [facility] to provide your oncology and hematology care.  To afford each patient quality time with our provider, please arrive at least 15 minutes before your scheduled appointment time.   If you have a lab appointment with the [facility] please come in thru the Main Entrance and check in at the main information desk.  You need to re-schedule your appointment should you arrive 10 or more minutes late.  We strive to give you quality time with our providers, and arriving late affects you and other patients whose appointments are after yours.  Also, if you no show three or more times for appointments you may be dismissed from the clinic at the providers discretion.     Again, thank you for choosing [facility].  Our hope is that these requests will decrease the amount of time that you wait before being seen by our physicians.       _____________________________________________________________  Should you have questions after your visit to [facility], please contact our office at [phone] and follow the prompts.  Our office hours are 8:00 a.m. and 4:30 p.m. Monday - Friday.  Please note that voicemails left after 4:00 p.m. may not be returned until the following business day.  We are closed weekends and major holidays.  You do have access to a nurse 24-7, just call the main number to the clinic [phone] and do not press any options, hold on the line and a nurse will answer the phone.    For prescription refill  requests, have your pharmacy contact our office and allow 72 hours.    Due to Covid, you will need to wear a mask upon entering the hospital. If you do not have a mask, a mask will be given to you at the Main Entrance upon arrival. For doctor visits, patients may have 1 support person age 19 or older with them. For treatment visits, patients can not have anyone with them due to social distancing guidelines and our immunocompromised population.

## 2022-01-09 NOTE — Progress Notes (Signed)
Sara Coffey, [place] [address]   CLINIC:  Medical Oncology/Hematology  PCP:  Jamey Ripa [facility] [employer], [address] / [place] [address] [phone]   REASON FOR VISIT:  Follow-up for colon cancer  PRIOR THERAPY: Right hemicolectomy on 04/09/2020  NGS Results: [employer] MSI--high; BRAF V600E; MLH1 hyper methylation positive  CURRENT THERAPY: Surveillance  BRIEF ONCOLOGIC HISTORY:  Oncology History  Cancer of ascending colon (Sara Coffey)  05/10/2020 Genetic Testing   BRAF Mutation Analysis:     05/11/2020 Genetic Testing   MLH1 Promoter Methylation Analysis:       CANCER STAGING: Cancer Staging  No matching staging information was found for the patient.  INTERVAL HISTORY:  Sara Coffey, a 71 y.o. female, returns for routine follow-up of her colon cancer. Sara Coffey was last seen on 10/09/2021.   Today she reports feeling good. She denies SOB and changes in BM. She denies constipation and diarrhea. She reports occasional cough productive of yellow sputum which has improved.   REVIEW OF SYSTEMS:  Review of Systems  Constitutional:  Negative for appetite change and fatigue.  Respiratory:  Positive for cough (improved). Negative for shortness of breath.   Gastrointestinal:  Negative for constipation and diarrhea.  Psychiatric/Behavioral:  Positive for depression. The patient is nervous/anxious.   All other systems reviewed and are negative.  PAST MEDICAL/SURGICAL HISTORY:  Past Medical History:  Diagnosis Date   Amnesia 04/17/2019   Hemorrhoid 09/28/2013   Hyperlipidemia    Osteoporosis    Syncope and collapse    TGA (transient global amnesia) 04/18/2019   TIA (transient ischemic attack) 04/17/2019   Tobacco abuse    Past Surgical History:  Procedure Laterality Date   BIOPSY  03/02/2020   Procedure: BIOPSY;  Surgeon: Rogene Houston, MD;  [facility];  Service: Endoscopy;;   ascending colon mass   COLON SURGERY     COLONOSCOPY WITH PROPOFOL N/A 03/02/2020   Procedure: COLONOSCOPY WITH PROPOFOL;  Surgeon: Rogene Houston, MD;  [facility];  Service: Endoscopy;  Laterality: N/A;  1055   COLONOSCOPY WITH PROPOFOL N/A 01/09/2021   Procedure: COLONOSCOPY WITH PROPOFOL;  Surgeon: Rogene Houston, MD;  [facility];  Service: Endoscopy;  Laterality: N/A;  [place] BLOCK  04/05/2021   Procedure: INTERCOSTAL NERVE BLOCK;  Surgeon: [name], MD;  Location: [facility];  Service: Thoracic;;   KNEE SX     LAPAROSCOPIC PARTIAL COLECTOMY N/A 04/09/2020   Procedure: LAPAROSCOPIC PARTIAL COLECTOMY;  Surgeon: Virl Cagey, MD;  Location: [facility];  Service: General;  Laterality: N/A;   left eye surgery      LYMPH NODE DISSECTION Left 04/05/2021   Procedure: LYMPH NODE DISSECTION;  Surgeon: [name], MD;  Location: [facility];  Service: Thoracic;  Laterality: Left;   POLYPECTOMY  03/02/2020   Procedure: POLYPECTOMY;  Surgeon: Rogene Houston, MD;  [facility];  Service: Endoscopy;;   POLYPECTOMY  01/09/2021   Procedure: POLYPECTOMY;  Surgeon: Rogene Houston, MD;  [facility];  Service: Endoscopy;;   THORACOTOMY  04/05/2021   Procedure: THORACOTOMY MAJOR;  Surgeon: [name], MD;  Location: [facility];  Service: Thoracic;;   TUBAL LIGATION     VIDEO ASSISTED THORACOSCOPY Left 04/10/2021   Procedure: VIDEO ASSISTED THORACOSCOPY, EVACUATION OF HEMOTHORAX, LEFT LOWER LOBE WEDGE RESECTION;  Surgeon: [name], MD;  Location: [facility]  OR;  Service: Thoracic;  Laterality: Left;   XI ROBOTIC ASSISTED THORACOSCOPY- SEGMENTECTOMY Left 04/05/2021   Procedure: XI ROBOTIC ASSISTED THORACOSCOPY-LEFT LOWER LOBE SUPERIOR SEGMENTECTOMY;  Surgeon: [name], MD;  Location: [facility];  Service: Thoracic;  Laterality: Left;    SOCIAL HISTORY:  Social History   Socioeconomic History   Marital  status: Married    Spouse name: Not on file   Number of children: 3   Years of education: Not on file   Highest education level: Not on file  Occupational History   Occupation: retired  Tobacco Use   Smoking status: Former    Packs/day: 1.00    Years: 40.00    Pack years: 40.00    Types: Cigarettes    Quit date: 03/28/2021    Years since quitting: 0.7   Smokeless tobacco: Never  Vaping Use   Vaping Use: Never used  Substance and Sexual Activity   Alcohol use: No   Drug use: No   Sexual activity: Not Currently    Birth control/protection: Surgical, Post-menopausal    Comment: tubal  Other Topics Concern   Not on file  Social History Narrative   Not on file   Social Determinants of Health   Financial Resource Strain: Not on file  Food Insecurity: Not on file  Transportation Needs: Not on file  Physical Activity: Not on file  Stress: Not on file  Social Connections: Not on file  Intimate Partner Violence: Not on file    FAMILY HISTORY:  Family History  Problem Relation Age of Onset   Heart disease Father    Hypertension Father    Diabetes Father    Cancer Brother    Healthy Son    Healthy Son    Healthy Daughter    Ulcers Mother    Kidney failure Maternal Grandfather    Throat cancer Paternal Grandfather    Cancer Brother    Breast cancer Neg Hx     CURRENT MEDICATIONS:  Current Outpatient Medications  Medication Sig Dispense Refill   acetaminophen (TYLENOL) 500 MG tablet Take 2 tablets (1,000 mg total) by mouth every 6 (six) hours as needed for mild pain, fever or headache. 30 tablet 0   Calcium Carb-Cholecalciferol (CALCIUM-VITAMIN D) 600-400 MG-UNIT TABS Take 1 tablet by mouth every other day.     calcium carbonate (TUMS - DOSED IN MG ELEMENTAL CALCIUM) 500 MG chewable tablet Chew 1 tablet by mouth as needed for indigestion or heartburn.     Ferrous Sulfate (IRON) 325 (65 Fe) MG TABS Take 325 mg by mouth every other day. In the morning     Melatonin 10  MG TABS Take 10 mg by mouth at bedtime as needed (sleep).     Multiple Vitamins-Minerals (MULTIVITAMIN WITH MINERALS) tablet Take 1 tablet by mouth daily. Multivitamin for Women 50+     Omega-3 Fatty Acids (FISH OIL) 1000 MG CAPS Take 2,000 mg by mouth in the morning.     oxyCODONE (OXY IR/ROXICODONE) 5 MG immediate release tablet Take 1 tablet (5 mg total) by mouth every 6 (six) hours as needed for severe pain. 30 tablet 0   pregabalin (LYRICA) 25 MG capsule Take 1 capsule (25 mg total) by mouth 2 (two) times daily. 60 capsule 0   rosuvastatin (CRESTOR) 5 MG tablet Take 1 tablet (5 mg total) by mouth daily. 90 tablet 1   vitamin B-12 (CYANOCOBALAMIN) 1000 MCG tablet Take 2,000 mcg by mouth daily.     No current  facility-administered medications for this visit.    ALLERGIES:  No Known Allergies  PHYSICAL EXAM:  Performance status (ECOG): 0 - Asymptomatic  There were no vitals filed for this visit. Wt Readings from Last 3 Encounters:  01/07/22 140 lb (63.5 kg)  10/09/21 140 lb 3.4 oz (63.6 kg)  10/08/21 141 lb 9.6 oz (64.2 kg)   Physical Exam Vitals reviewed.  Constitutional:      Appearance: Normal appearance.  Cardiovascular:     Rate and Rhythm: Normal rate and regular rhythm.     Pulses: Normal pulses.     Heart sounds: Normal heart sounds.  Pulmonary:     Effort: Pulmonary effort is normal.     Breath sounds: Normal breath sounds.  Abdominal:     Palpations: Abdomen is soft. There is no hepatomegaly, splenomegaly or mass.     Tenderness: There is no abdominal tenderness.  Lymphadenopathy:     Cervical: No cervical adenopathy.     Right cervical: No superficial cervical adenopathy.    Left cervical: No superficial cervical adenopathy.     Upper Body:     Right upper body: No supraclavicular adenopathy.     Left upper body: No supraclavicular adenopathy.  Neurological:     General: No focal deficit present.     Mental Status: She is alert and oriented to person, place,  and time.  Psychiatric:        Mood and Affect: Mood normal.        Behavior: Behavior normal.     LABORATORY DATA:  I have reviewed the labs as listed.     Latest Ref Rng & Units 12/31/2021    1:13 PM 10/04/2021   11:13 AM 05/27/2021   10:07 AM  CBC  WBC 4.0 - 10.5 K/uL 4.9   4.8   4.7    Hemoglobin 12.0 - 15.0 g/dL 12.9   12.4   12.3    Hematocrit 36.0 - 46.0 % 38.8   37.7   39.6    Platelets 150 - 400 K/uL 273   272   346        Latest Ref Rng & Units 12/31/2021    1:13 PM 10/04/2021   11:13 AM 05/27/2021   10:07 AM  CMP  Glucose 70 - 99 mg/dL 119   89   75    BUN 8 - 23 mg/dL _0 Creatinine 0.44 - 1.00 mg/dL 0.77   0.77   0.75    Sodium 135 - 145 mmol/L 138   137   141    Potassium 3.5 - 5.1 mmol/L 3.8   3.8   5.5    Chloride 98 - 111 mmol/L 105   105   106    CO2 22 - 32 mmol/L 27   24   32    Calcium 8.9 - 10.3 mg/dL 9.5   9.4   10.0    Total Protein 6.5 - 8.1 g/dL 7.2   7.0   6.9    Total Bilirubin 0.3 - 1.2 mg/dL 0.4   0.4   0.4    Alkaline Phos 38 - 126 U/L 77   74     AST 15 - 41 U/L _1 ALT 0 - 44 U/L _2 DIAGNOSTIC IMAGING:  I have independently reviewed the scans and discussed with  the patient. CT Chest W Contrast  Result Date: 01/01/2022 CLINICAL DATA:  Follow-up left lung lesion. Postop from left lower lobe wedge resection. EXAM: CT CHEST WITH CONTRAST TECHNIQUE: Multidetector CT imaging of the chest was performed during intravenous contrast administration. RADIATION DOSE REDUCTION: This exam was performed according to the departmental dose-optimization program which includes automated exposure control, adjustment of the mA and/or kV according to patient size and/or use of iterative reconstruction technique. CONTRAST:  38m OMNIPAQUE IOHEXOL 300 MG/ML  SOLN COMPARISON:  07/25/2021 FINDINGS: Cardiovascular: No acute findings. Aortic and coronary atherosclerotic calcification incidentally noted. Mediastinum/Nodes: No masses or  pathologically enlarged lymph nodes identified. Lungs/Pleura: Postop changes again seen from left lower lobe wedge resection. Pleural-parenchymal scarring is again seen in the posterior left lower lobe. An intraparenchymal fluid collection is seen in the posterior left lower lobe, which no longer contains gas and is significantly decreased in size. This currently measures 3.0 x 2.9 cm compared to 6.7 x 5.6 cm previously. No suspicious pulmonary nodules or masses are identified. Right lung is clear. Upper Abdomen: Stable small cysts in the left hepatic lobe and upper pole of the right kidney. Musculoskeletal:  No suspicious bone lesions. IMPRESSION: Significant decrease in size of intraparenchymal fluid collection in posterior left lower lobe. Stable left lower lobe pleural-parenchymal scarring. No evidence of recurrent or metastatic carcinoma within the thorax. Aortic Atherosclerosis (ICD10-I70.0). Electronically Signed   By: JMarlaine HindM.D.   On: 01/01/2022 16:53     ASSESSMENT:  1.  Stage II (T3N0) right colon adenocarcinoma: -Colonoscopy on 03/02/2020 biopsy of the ascending colon mass consistent with adenocarcinoma.  Sessile serrated polyp in the hepatic flexure, tubular adenoma in the hepatic, transverse and proximal colon.  Tubular adenoma in the rectum. -CTAP on 03/14/2020 shows 4.6 cm intraluminal mass in the right colon consistent with known colon cancer.  No evidence of metastatic disease in the abdomen or pelvis. -Preoperative CEA on 04/06/2020 was 3.6. -Right hemicolectomy on 04/09/2020 with pathology showing 3.5 cm moderately differentiated adenocarcinoma, separate focus of invasive adenocarcinoma 0.5 cm arising in a tubular adenoma with high-grade dysplasia, 0/32 lymph nodes involved, no perforation, no lymphovascular/perineural invasion, margins negative, PT3PN0. -MMR shows loss of nuclear expression of MLH1 and PMS2.  MSI was high. -BRAF V600 and MLH1 hypermethylation positive.  Indicates  sporadic nature. -CT chest with contrast on 05/15/2020 with no metastatic disease.  3 mm pulmonary nodule of the dependent right lower lobe and 7 mm groundglass opacity of the dependent superior segment of the left lower lobe.   2.  Social/family history: -She is a retired wPsychiatric nurse -Current active smoker, 1 pack/day for 557years. -One brother had prostate cancer.  Another brother had multiple different type of cancers, known to the patient.  Paternal grandfather had throat cancer and a maternal aunt had stomach cancer.  3.  Stage I (T1AN0) adenocarcinoma of the left lower lobe of the lung: - Pathology on 04/05/2021 showed invasive adenocarcinoma, 0.7 cm, margins negative.  Lymph nodes from station 7, 9, 10, 4L, 8, 12, 5, 11, 13 were negative. - Given the size of the tumor, no indication for adjuvant therapy.   PLAN:  1.  Stage II (T3N0) right colon adenocarcinoma: - She does not report any change in bowel habits or bleeding per rectum. - Last CTAP on 10/09/2021: Did not show any evidence of recurrence or metastatic disease. - Reviewed labs today which showed normal LFTs and CBC.  CEA was 1.8. - Recommend follow-up in 3 months  with repeat CTAP with contrast and CEA.  After that visit, we will switch her to 55-monthvisits and yearly CTAP.   2.  Stage I (T1AN0) adenocarcinoma of the left lower lobe of the lung: - Reviewed CT chest with contrast from 12/31/2021: Significant decrease in size of the intraparenchymal fluid collection in the posterior left lower lobe.  Stable left lower lobe pleural parenchymal scarring.  No evidence of recurrent or metastatic disease. - We will repeat another scan in 6 months.   Orders placed this encounter:  No orders of the defined types were placed in this encounter.    SDerek Jack MD [id number]  I, KThana Ates am acting as a scribe for Dr. SDerek Jack  I, SDerek JackMD, have reviewed the  above documentation for accuracy and completeness, and I agree with the above.

## 2022-02-03 ENCOUNTER — Ambulatory Visit (HOSPITAL_COMMUNITY)
Admission: RE | Admit: 2022-02-03 | Discharge: 2022-02-03 | Disposition: A | Discharge status: Home / Self Care | Payer: Medicare HMO | Source: Ambulatory Visit | Attending: Internal Medicine | Admitting: Internal Medicine

## 2022-02-03 DIAGNOSIS — Z780 Asymptomatic menopausal state: Secondary | ICD-10-CM | POA: Diagnosis not present

## 2022-02-03 DIAGNOSIS — M810 Age-related osteoporosis without current pathological fracture: Secondary | ICD-10-CM | POA: Insufficient documentation

## 2022-02-03 DIAGNOSIS — M85851 Other specified disorders of bone density and structure, right thigh: Secondary | ICD-10-CM | POA: Diagnosis not present

## 2022-04-10 ENCOUNTER — Inpatient Hospital Stay: Payer: Medicare HMO | Attending: Hematology

## 2022-04-10 ENCOUNTER — Ambulatory Visit (HOSPITAL_COMMUNITY)
Admission: RE | Admit: 2022-04-10 | Discharge: 2022-04-10 | Disposition: A | Discharge status: Home / Self Care | Payer: Medicare HMO | Source: Ambulatory Visit | Attending: Hematology | Admitting: Hematology

## 2022-04-10 DIAGNOSIS — R911 Solitary pulmonary nodule: Secondary | ICD-10-CM | POA: Insufficient documentation

## 2022-04-10 DIAGNOSIS — C189 Malignant neoplasm of colon, unspecified: Secondary | ICD-10-CM

## 2022-04-10 DIAGNOSIS — C182 Malignant neoplasm of ascending colon: Secondary | ICD-10-CM | POA: Insufficient documentation

## 2022-04-10 DIAGNOSIS — K7689 Other specified diseases of liver: Secondary | ICD-10-CM | POA: Diagnosis not present

## 2022-04-10 DIAGNOSIS — K5730 Diverticulosis of large intestine without perforation or abscess without bleeding: Secondary | ICD-10-CM | POA: Diagnosis not present

## 2022-04-10 LAB — COMPREHENSIVE METABOLIC PANEL
ALT: 19 U/L (ref 0–44)
AST: 19 U/L (ref 15–41)
Albumin: 4.4 g/dL (ref 3.5–5.0)
Alkaline Phosphatase: 76 U/L (ref 38–126)
Anion gap: 7 (ref 5–15)
BUN: 11 mg/dL (ref 8–23)
CO2: 28 mmol/L (ref 22–32)
Calcium: 9.7 mg/dL (ref 8.9–10.3)
Chloride: 105 mmol/L (ref 98–111)
Creatinine, Ser: 0.63 mg/dL (ref 0.44–1.00)
GFR, Estimated: >60 mL/min (ref >60)
Glucose, Bld: 90 mg/dL (ref 70–99)
Potassium: 3.8 mmol/L (ref 3.5–5.1)
Sodium: 140 mmol/L (ref 135–145)
Total Bilirubin: 1 mg/dL (ref 0.3–1.2)
Total Protein: 7.9 g/dL (ref 6.5–8.1)

## 2022-04-10 LAB — CBC WITH DIFFERENTIAL/PLATELET
Abs Immature Granulocytes: 0.01 10*3/uL (ref 0.00–0.07)
Basophils Absolute: 0.1 10*3/uL (ref 0.0–0.1)
Basophils Relative: 2 %
Eosinophils Absolute: 0.2 10*3/uL (ref 0.0–0.5)
Eosinophils Relative: 5 %
HCT: 41.1 % (ref 36.0–46.0)
Hemoglobin: 13.8 g/dL (ref 12.0–15.0)
Immature Granulocytes: 0 %
Lymphocytes Relative: 34 %
Lymphs Abs: 1.7 10*3/uL (ref 0.7–4.0)
MCH: 30.8 pg (ref 26.0–34.0)
MCHC: 33.6 g/dL (ref 30.0–36.0)
MCV: 91.7 fL (ref 80.0–100.0)
Monocytes Absolute: 0.2 10*3/uL (ref 0.1–1.0)
Monocytes Relative: 4 %
Neutro Abs: 2.7 10*3/uL (ref 1.7–7.7)
Neutrophils Relative %: 55 %
Platelets: 284 10*3/uL (ref 150–400)
RBC: 4.48 MIL/uL (ref 3.87–5.11)
RDW: 12.7 % (ref 11.5–15.5)
WBC: 4.9 10*3/uL (ref 4.0–10.5)
nRBC: 0 % (ref 0.0–0.2)

## 2022-04-10 MED ORDER — IOHEXOL 300 MG/ML  SOLN
80.0000 mL | Freq: Once | INTRAMUSCULAR | Status: AC | PRN
  Administered 2022-04-10: 80 mL via INTRAVENOUS

## 2022-04-12 LAB — CEA: CEA: 2.4 ng/mL (ref 0.0–4.7)

## 2022-04-17 ENCOUNTER — Inpatient Hospital Stay: Payer: Medicare HMO | Admitting: Hematology

## 2022-04-24 ENCOUNTER — Inpatient Hospital Stay: Payer: Medicare HMO | Attending: Hematology | Admitting: Hematology

## 2022-04-24 VITALS — BP 108/71 | HR 75 | Temp 97.9°F | Resp 16 | Wt 139.6 lb

## 2022-04-24 DIAGNOSIS — R911 Solitary pulmonary nodule: Secondary | ICD-10-CM | POA: Diagnosis not present

## 2022-04-24 DIAGNOSIS — C3412 Malignant neoplasm of upper lobe, left bronchus or lung: Secondary | ICD-10-CM | POA: Diagnosis not present

## 2022-04-24 DIAGNOSIS — C182 Malignant neoplasm of ascending colon: Secondary | ICD-10-CM | POA: Diagnosis not present

## 2022-04-24 DIAGNOSIS — C3432 Malignant neoplasm of lower lobe, left bronchus or lung: Secondary | ICD-10-CM | POA: Insufficient documentation

## 2022-04-24 NOTE — Patient Instructions (Addendum)
[place] at [facility] Discharge Instructions   You were seen and examined today by Dr. Delton Coombes.  He reviewed the results of your lab work which are normal.   He reviewed the results of your CT scan which are normal.   We will see you back in 3 months. We will repeat lab work and a CT scan of your chest prior to your next visit.    Thank you for choosing [place] at [facility] to provide your oncology and hematology care.  To afford each patient quality time with our provider, please arrive at least 15 minutes before your scheduled appointment time.   If you have a lab appointment with the [facility] please come in thru the Main Entrance and check in at the main information desk.  You need to re-schedule your appointment should you arrive 10 or more minutes late.  We strive to give you quality time with our providers, and arriving late affects you and other patients whose appointments are after yours.  Also, if you no show three or more times for appointments you may be dismissed from the clinic at the providers discretion.     Again, thank you for choosing [facility].  Our hope is that these requests will decrease the amount of time that you wait before being seen by our physicians.       _____________________________________________________________  Should you have questions after your visit to [facility], please contact our office at [phone] and follow the prompts.  Our office hours are 8:00 a.m. and 4:30 p.m. Monday - Friday.  Please note that voicemails left after 4:00 p.m. may not be returned until the following business day.  We are closed weekends and major holidays.  You do have access to a nurse 24-7, just call the main number to the clinic [phone] and do not press any options, hold on the line and a nurse will answer the phone.    For prescription refill requests, have your pharmacy  contact our office and allow 72 hours.    Due to Covid, you will need to wear a mask upon entering the hospital. If you do not have a mask, a mask will be given to you at the Main Entrance upon arrival. For doctor visits, patients may have 1 support person age 62 or older with them. For treatment visits, patients can not have anyone with them due to social distancing guidelines and our immunocompromised population.

## 2022-04-25 NOTE — Progress Notes (Signed)
[name], [place] [address]   CLINIC:  Medical Oncology/Hematology  PCP:  Jamey Ripa [employer], [address] / [place] [address] [phone]   REASON FOR VISIT:  Follow-up for colon cancer  PRIOR THERAPY: Right hemicolectomy on 04/09/2020  NGS Results: [employer] MSI--high; BRAF V600E; MLH1 hyper methylation positive  CURRENT THERAPY: Surveillance  BRIEF ONCOLOGIC HISTORY:  Oncology History  Cancer of ascending colon ([place])  05/10/2020 Genetic Testing   BRAF Mutation Analysis:     05/11/2020 Genetic Testing   MLH1 Promoter Methylation Analysis:       CANCER STAGING:  Cancer Staging  No matching staging information was found for the patient.  INTERVAL HISTORY:  [name], a 71 y.o. female, returns for follow-up of Colon and lung cancers.  Denies any bleeding per rectum or melena.  She has some dark stools as she is taking iron tablet daily.  REVIEW OF SYSTEMS:  Review of Systems  Constitutional:  Negative for appetite change and fatigue.  Gastrointestinal:  Negative for diarrhea.  Psychiatric/Behavioral:  Negative for depression. The patient is not nervous/anxious.   All other systems reviewed and are negative.   PAST MEDICAL/SURGICAL HISTORY:  Past Medical History:  Diagnosis Date   Amnesia 04/17/2019   Hemorrhoid 09/28/2013   Hyperlipidemia    Osteoporosis    Syncope and collapse    TGA (transient global amnesia) 04/18/2019   TIA (transient ischemic attack) 04/17/2019   Tobacco abuse    Past Surgical History:  Procedure Laterality Date   BIOPSY  03/02/2020   Procedure: BIOPSY;  Surgeon: Rogene Houston, MD;  [facility];  Service: Endoscopy;;  ascending colon mass   COLON SURGERY     COLONOSCOPY WITH PROPOFOL N/A 03/02/2020   Procedure: COLONOSCOPY WITH PROPOFOL;  Surgeon: Rogene Houston, MD;  [facility];  Service: Endoscopy;  Laterality:  N/A;  1055   COLONOSCOPY WITH PROPOFOL N/A 01/09/2021   Procedure: COLONOSCOPY WITH PROPOFOL;  Surgeon: Rogene Houston, MD;  [facility];  Service: Endoscopy;  Laterality: N/A;  [place] BLOCK  04/05/2021   Procedure: INTERCOSTAL NERVE BLOCK;  Surgeon: [name], MD;  Location: [facility];  Service: Thoracic;;   KNEE SX     LAPAROSCOPIC PARTIAL COLECTOMY N/A 04/09/2020   Procedure: LAPAROSCOPIC PARTIAL COLECTOMY;  Surgeon: Virl Cagey, MD;  Location: [facility];  Service: General;  Laterality: N/A;   left eye surgery      LYMPH NODE DISSECTION Left 04/05/2021   Procedure: LYMPH NODE DISSECTION;  Surgeon: [name], MD;  Location: [facility];  Service: Thoracic;  Laterality: Left;   POLYPECTOMY  03/02/2020   Procedure: POLYPECTOMY;  Surgeon: Rogene Houston, MD;  [facility];  Service: Endoscopy;;   POLYPECTOMY  01/09/2021   Procedure: POLYPECTOMY;  Surgeon: Rogene Houston, MD;  [facility];  Service: Endoscopy;;   THORACOTOMY  04/05/2021   Procedure: THORACOTOMY MAJOR;  Surgeon: [name], MD;  Location: [facility];  Service: Thoracic;;   TUBAL LIGATION     VIDEO ASSISTED THORACOSCOPY Left 04/10/2021   Procedure: VIDEO ASSISTED THORACOSCOPY, EVACUATION OF HEMOTHORAX, LEFT LOWER LOBE WEDGE RESECTION;  Surgeon: [name], MD;  Location: [facility];  Service: Thoracic;  Laterality: Left;   XI ROBOTIC ASSISTED THORACOSCOPY- SEGMENTECTOMY Left 04/05/2021   Procedure: XI ROBOTIC ASSISTED THORACOSCOPY-LEFT LOWER LOBE SUPERIOR SEGMENTECTOMY;  Surgeon: [name], MD;  Location: [facility];  Service: Thoracic;  Laterality: Left;    SOCIAL HISTORY:  Social History   Socioeconomic History   Marital status: Married    Spouse name: Not on file   Number of children: 3   Years of education: Not on file   Highest education level: Not on file  Occupational History   Occupation: retired  Tobacco Use   Smoking  status: Former    Packs/day: 1.00    Years: 40.00    Total pack years: 40.00    Types: Cigarettes    Quit date: 03/28/2021    Years since quitting: 1.0   Smokeless tobacco: Never  Vaping Use   Vaping Use: Never used  Substance and Sexual Activity   Alcohol use: No   Drug use: No   Sexual activity: Not Currently    Birth control/protection: Surgical, Post-menopausal    Comment: tubal  Other Topics Concern   Not on file  Social History Narrative   Not on file   Social Determinants of Health   Financial Resource Strain: Low Risk  (05/01/2020)   Overall Financial Resource Strain (CARDIA)    Difficulty of Paying Living Expenses: Not hard at all  Food Insecurity: No Food Insecurity (05/01/2020)   Hunger Vital Sign    Worried About Running Out of Food in the Last Year: Never true    [place] in the Last Year: Never true  Transportation Needs: No Transportation Needs (05/01/2020)   PRAPARE - Hydrologist (Medical): No    Lack of Transportation (Non-Medical): No  Physical Activity: Inactive (05/01/2020)   Exercise Vital Sign    Days of Exercise per Week: 0 days    Minutes of Exercise per Session: 0 min  Stress: No Stress Concern Present (05/01/2020)   [place]    Feeling of Stress : Only a little  Social Connections: Moderately Isolated (05/01/2020)   Social Connection and Isolation Panel [NHANES]    Frequency of Communication with Friends and Family: More than three times a week    Frequency of Social Gatherings with Friends and Family: Once a week    Attends Religious Services: Never    Marine scientist or Organizations: No    Attends Archivist Meetings: Never    Marital Status: Married  Human resources officer Violence: Not At Risk (05/01/2020)   Humiliation, Afraid, Rape, and Kick questionnaire    Fear of Current or Ex-Partner: No    Emotionally Abused: No     Physically Abused: No    Sexually Abused: No    FAMILY HISTORY:  Family History  Problem Relation Age of Onset   Heart disease Father    Hypertension Father    Diabetes Father    Cancer Brother    Healthy Son    Healthy Son    Healthy Daughter    Ulcers Mother    Kidney failure Maternal Grandfather    Throat cancer Paternal Grandfather    Cancer Brother    Breast cancer Neg Hx     CURRENT MEDICATIONS:  Current Outpatient Medications  Medication Sig Dispense Refill   acetaminophen (TYLENOL) 500 MG tablet Take 2 tablets (1,000 mg total) by mouth every 6 (six) hours as needed for mild pain, fever or headache. 30 tablet 0   alendronate (FOSAMAX) 70 MG tablet Take 70 mg by mouth daily.  calcium carbonate (TUMS - DOSED IN MG ELEMENTAL CALCIUM) 500 MG chewable tablet Chew 1 tablet by mouth as needed for indigestion or heartburn.     Cyanocobalamin (VITAMIN B 12) 500 MCG TABS 1 tablet     Ferrous Sulfate (IRON) 325 (65 Fe) MG TABS Take 325 mg by mouth every other day. In the morning     Melatonin 10 MG TABS Take 10 mg by mouth at bedtime as needed (sleep).     Multiple Vitamins-Minerals (MULTIVITAMIN WITH MINERALS) tablet Take 1 tablet by mouth daily. Multivitamin for Women 50+     Omega-3 Fatty Acids (FISH OIL) 1000 MG CAPS Take 2,000 mg by mouth in the morning.     Oyster Shell Calcium 500 MG TABS 1 tablet with meals     rosuvastatin (CRESTOR) 5 MG tablet Take 1 tablet (5 mg total) by mouth daily. 90 tablet 1   No current facility-administered medications for this visit.    ALLERGIES:  No Known Allergies  PHYSICAL EXAM:  Performance status (ECOG): 0 - Asymptomatic  Vitals:   04/24/22 1143  BP: 108/71  Pulse: 75  Resp: 16  Temp: 97.9 F (36.6 C)  SpO2: 95%   Wt Readings from Last 3 Encounters:  04/24/22 139 lb 9.6 oz (63.3 kg)  01/09/22 139 lb 8 oz (63.3 kg)  01/07/22 140 lb (63.5 kg)   Physical Exam Vitals reviewed.  Constitutional:      Appearance: Normal  appearance.  Cardiovascular:     Rate and Rhythm: Normal rate and regular rhythm.     Pulses: Normal pulses.     Heart sounds: Normal heart sounds.  Pulmonary:     Effort: Pulmonary effort is normal.     Breath sounds: Normal breath sounds.  Abdominal:     Palpations: Abdomen is soft. There is no hepatomegaly, splenomegaly or mass.     Tenderness: There is no abdominal tenderness.  Lymphadenopathy:     Cervical: No cervical adenopathy.     Right cervical: No superficial cervical adenopathy.    Left cervical: No superficial cervical adenopathy.     Upper Body:     Right upper body: No supraclavicular adenopathy.     Left upper body: No supraclavicular adenopathy.  Neurological:     General: No focal deficit present.     Mental Status: She is alert and oriented to person, place, and time.  Psychiatric:        Mood and Affect: Mood normal.        Behavior: Behavior normal.      LABORATORY DATA:  I have reviewed the labs as listed.     Latest Ref Rng & Units 04/10/2022    1:19 PM 12/31/2021    1:13 PM 10/04/2021   11:13 AM  CBC  WBC 4.0 - 10.5 K/uL 4.9  4.9  4.8   Hemoglobin 12.0 - 15.0 g/dL 13.8  12.9  12.4   Hematocrit 36.0 - 46.0 % 41.1  38.8  37.7   Platelets 150 - 400 K/uL 284  273  272       Latest Ref Rng & Units 04/10/2022    1:19 PM 12/31/2021    1:13 PM 10/04/2021   11:13 AM  CMP  Glucose 70 - 99 mg/dL 90  119  89   BUN 8 - 23 mg/dL _0 Creatinine 0.44 - 1.00 mg/dL 0.63  0.77  0.77   Sodium 135 - 145 mmol/L 140  138  137   Potassium 3.5 - 5.1 mmol/L 3.8  3.8  3.8   Chloride 98 - 111 mmol/L 105  105  105   CO2 22 - 32 mmol/L _0 Calcium 8.9 - 10.3 mg/dL 9.7  9.5  9.4   Total Protein 6.5 - 8.1 g/dL 7.9  7.2  7.0   Total Bilirubin 0.3 - 1.2 mg/dL 1.0  0.4  0.4   Alkaline Phos 38 - 126 U/L 76  77  74   AST 15 - 41 U/L _1 ALT 0 - 44 U/L _2 DIAGNOSTIC IMAGING:  I have independently reviewed the scans and discussed with  the patient. CT Abdomen Pelvis W Contrast  Result Date: 04/11/2022 CLINICAL DATA:  Follow-up colon carcinoma.  Surveillance. * Tracking Code: BO * EXAM: CT ABDOMEN AND PELVIS WITH CONTRAST TECHNIQUE: Multidetector CT imaging of the abdomen and pelvis was performed using the standard protocol following bolus administration of intravenous contrast. RADIATION DOSE REDUCTION: This exam was performed according to the departmental dose-optimization program which includes automated exposure control, adjustment of the mA and/or kV according to patient size and/or use of iterative reconstruction technique. CONTRAST:  41m OMNIPAQUE IOHEXOL 300 MG/ML  SOLN COMPARISON:  10/07/2021 FINDINGS: Lower Chest: Left lower lobe pleural-parenchymal scarring noted. Hepatobiliary: No hepatic masses identified. Stable small left hepatic lobe cyst. Gallbladder is unremarkable. No evidence of biliary ductal dilatation. Pancreas:  No mass or inflammatory changes. Spleen: Within normal limits in size and appearance. Adrenals/Urinary Tract: No suspicious masses identified. No evidence of ureteral calculi or hydronephrosis. Stomach/Bowel: Prior right hemicolectomy again demonstrated. No masses identified. No evidence of obstruction, inflammatory process or abnormal fluid collections. Diverticulosis is seen mainly involving the sigmoid colon, however there is no evidence of diverticulitis. Vascular/Lymphatic: No pathologically enlarged lymph nodes. No acute vascular findings. Aortic atherosclerotic calcification incidentally noted. Reproductive:  No mass or other significant abnormality. Other:  None. Musculoskeletal:  No suspicious bone lesions identified. IMPRESSION: Stable exam. No evidence of recurrent or metastatic carcinoma within the abdomen or pelvis. Colonic diverticulosis. No radiographic evidence of diverticulitis. Aortic Atherosclerosis (ICD10-I70.0). Electronically Signed   By: JMarlaine HindM.D.   On: 04/11/2022 15:45      ASSESSMENT:  1.  Stage II (T3N0) right colon adenocarcinoma: -Colonoscopy on 03/02/2020 biopsy of the ascending colon mass consistent with adenocarcinoma.  Sessile serrated polyp in the hepatic flexure, tubular adenoma in the hepatic, transverse and proximal colon.  Tubular adenoma in the rectum. -CTAP on 03/14/2020 shows 4.6 cm intraluminal mass in the right colon consistent with known colon cancer.  No evidence of metastatic disease in the abdomen or pelvis. -Preoperative CEA on 04/06/2020 was 3.6. -Right hemicolectomy on 04/09/2020 with pathology showing 3.5 cm moderately differentiated adenocarcinoma, separate focus of invasive adenocarcinoma 0.5 cm arising in a tubular adenoma with high-grade dysplasia, 0/32 lymph nodes involved, no perforation, no lymphovascular/perineural invasion, margins negative, PT3PN0. -MMR shows loss of nuclear expression of MLH1 and PMS2.  MSI was high. -BRAF V600 and MLH1 hypermethylation positive.  Indicates sporadic nature. -CT chest with contrast on 05/15/2020 with no metastatic disease.  3 mm pulmonary nodule of the dependent right lower lobe and 7 mm groundglass opacity of the dependent superior segment of the left lower lobe.   2.  Social/family history: -She is a retired wPsychiatric nurse -Current active smoker, 1 pack/day for 565years. -One brother had prostate cancer.  Another  brother had multiple different type of cancers, known to the patient.  Paternal grandfather had throat cancer and a maternal aunt had stomach cancer.  3.  Stage I (T1AN0) adenocarcinoma of the left lower lobe of the lung: - Pathology on 04/05/2021 showed invasive adenocarcinoma, 0.7 cm, margins negative.  Lymph nodes from station 7, 9, 10, 4L, 8, 12, 5, 11, 13 were negative. - Given the size of the tumor, no indication for adjuvant therapy.   PLAN:  1.  Stage II (T3N0) right colon adenocarcinoma: - She does not report any change in bowel habits or bleeding per rectum. - Labs show  normal LFTs from 04/10/2022.  CEA was normal at 2.4.  CBC was normal. - Reviewed CTAP (04/10/2022): No evidence of recurrence or metastatic disease. - She will have 41-monthvisits for colon cancer and yearly CTAP.   2.  Stage I (T1AN0) adenocarcinoma of the left lower lobe of the lung: - Last CT chest from 12/31/2021 showed significant decrease in size of the fluid collection in the posterior left lobe. - Recommend repeating CT scan prior to next visit in 3 months.   Orders placed this encounter:  Orders Placed This Encounter  Procedures   CDalhart MD [phone]

## 2022-04-29 ENCOUNTER — Other Ambulatory Visit: Payer: Self-pay | Admitting: *Deleted

## 2022-04-29 DIAGNOSIS — R911 Solitary pulmonary nodule: Secondary | ICD-10-CM

## 2022-04-29 DIAGNOSIS — C3490 Malignant neoplasm of unspecified part of unspecified bronchus or lung: Secondary | ICD-10-CM

## 2022-04-29 DIAGNOSIS — C189 Malignant neoplasm of colon, unspecified: Secondary | ICD-10-CM

## 2022-05-01 DIAGNOSIS — Z85828 Personal history of other malignant neoplasm of skin: Secondary | ICD-10-CM | POA: Diagnosis not present

## 2022-05-01 DIAGNOSIS — L821 Other seborrheic keratosis: Secondary | ICD-10-CM | POA: Diagnosis not present

## 2022-05-01 DIAGNOSIS — D1801 Hemangioma of skin and subcutaneous tissue: Secondary | ICD-10-CM | POA: Diagnosis not present

## 2022-05-01 DIAGNOSIS — D485 Neoplasm of uncertain behavior of skin: Secondary | ICD-10-CM | POA: Diagnosis not present

## 2022-05-01 DIAGNOSIS — Z08 Encounter for follow-up examination after completed treatment for malignant neoplasm: Secondary | ICD-10-CM | POA: Diagnosis not present

## 2022-05-01 DIAGNOSIS — C44519 Basal cell carcinoma of skin of other part of trunk: Secondary | ICD-10-CM | POA: Diagnosis not present

## 2022-05-01 DIAGNOSIS — L814 Other melanin hyperpigmentation: Secondary | ICD-10-CM | POA: Diagnosis not present

## 2022-06-18 DIAGNOSIS — L989 Disorder of the skin and subcutaneous tissue, unspecified: Secondary | ICD-10-CM | POA: Diagnosis not present

## 2022-06-18 DIAGNOSIS — C44519 Basal cell carcinoma of skin of other part of trunk: Secondary | ICD-10-CM | POA: Diagnosis not present

## 2022-07-09 DIAGNOSIS — G459 Transient cerebral ischemic attack, unspecified: Secondary | ICD-10-CM | POA: Diagnosis not present

## 2022-07-09 DIAGNOSIS — Z85038 Personal history of other malignant neoplasm of large intestine: Secondary | ICD-10-CM | POA: Diagnosis not present

## 2022-07-09 DIAGNOSIS — K644 Residual hemorrhoidal skin tags: Secondary | ICD-10-CM | POA: Diagnosis not present

## 2022-07-09 DIAGNOSIS — M818 Other osteoporosis without current pathological fracture: Secondary | ICD-10-CM | POA: Diagnosis not present

## 2022-07-09 DIAGNOSIS — Z23 Encounter for immunization: Secondary | ICD-10-CM | POA: Diagnosis not present

## 2022-07-09 DIAGNOSIS — Z85118 Personal history of other malignant neoplasm of bronchus and lung: Secondary | ICD-10-CM | POA: Diagnosis not present

## 2022-07-09 DIAGNOSIS — E782 Mixed hyperlipidemia: Secondary | ICD-10-CM | POA: Diagnosis not present

## 2022-07-21 ENCOUNTER — Other Ambulatory Visit (HOSPITAL_COMMUNITY): Payer: Self-pay | Admitting: Internal Medicine

## 2022-07-21 DIAGNOSIS — Z1231 Encounter for screening mammogram for malignant neoplasm of breast: Secondary | ICD-10-CM

## 2022-07-24 ENCOUNTER — Inpatient Hospital Stay: Payer: Medicare HMO | Attending: Hematology

## 2022-07-24 ENCOUNTER — Ambulatory Visit (HOSPITAL_COMMUNITY)
Admission: RE | Admit: 2022-07-24 | Discharge: 2022-07-24 | Disposition: A | Discharge status: Home / Self Care | Payer: Medicare HMO | Source: Ambulatory Visit | Attending: Hematology | Admitting: Hematology

## 2022-07-24 DIAGNOSIS — C189 Malignant neoplasm of colon, unspecified: Secondary | ICD-10-CM

## 2022-07-24 DIAGNOSIS — C3432 Malignant neoplasm of lower lobe, left bronchus or lung: Secondary | ICD-10-CM | POA: Diagnosis not present

## 2022-07-24 DIAGNOSIS — C3412 Malignant neoplasm of upper lobe, left bronchus or lung: Secondary | ICD-10-CM | POA: Insufficient documentation

## 2022-07-24 DIAGNOSIS — C182 Malignant neoplasm of ascending colon: Secondary | ICD-10-CM | POA: Diagnosis present

## 2022-07-24 DIAGNOSIS — Z87891 Personal history of nicotine dependence: Secondary | ICD-10-CM | POA: Diagnosis not present

## 2022-07-24 DIAGNOSIS — C3490 Malignant neoplasm of unspecified part of unspecified bronchus or lung: Secondary | ICD-10-CM | POA: Diagnosis not present

## 2022-07-24 LAB — CBC WITH DIFFERENTIAL/PLATELET
Abs Immature Granulocytes: 0.01 10*3/uL (ref 0.00–0.07)
Basophils Absolute: 0.1 10*3/uL (ref 0.0–0.1)
Basophils Relative: 2 %
Eosinophils Absolute: 0.2 10*3/uL (ref 0.0–0.5)
Eosinophils Relative: 4 %
HCT: 36.7 % (ref 36.0–46.0)
Hemoglobin: 12.4 g/dL (ref 12.0–15.0)
Immature Granulocytes: 0 %
Lymphocytes Relative: 39 %
Lymphs Abs: 1.9 10*3/uL (ref 0.7–4.0)
MCH: 30.7 pg (ref 26.0–34.0)
MCHC: 33.8 g/dL (ref 30.0–36.0)
MCV: 90.8 fL (ref 80.0–100.0)
Monocytes Absolute: 0.3 10*3/uL (ref 0.1–1.0)
Monocytes Relative: 6 %
Neutro Abs: 2.5 10*3/uL (ref 1.7–7.7)
Neutrophils Relative %: 49 %
Platelets: 243 10*3/uL (ref 150–400)
RBC: 4.04 MIL/uL (ref 3.87–5.11)
RDW: 12.6 % (ref 11.5–15.5)
WBC: 5 10*3/uL (ref 4.0–10.5)
nRBC: 0 % (ref 0.0–0.2)

## 2022-07-24 LAB — COMPREHENSIVE METABOLIC PANEL
ALT: 19 U/L (ref 0–44)
AST: 21 U/L (ref 15–41)
Albumin: 4.1 g/dL (ref 3.5–5.0)
Alkaline Phosphatase: 57 U/L (ref 38–126)
Anion gap: 9 (ref 5–15)
BUN: 12 mg/dL (ref 8–23)
CO2: 25 mmol/L (ref 22–32)
Calcium: 9.4 mg/dL (ref 8.9–10.3)
Chloride: 103 mmol/L (ref 98–111)
Creatinine, Ser: 0.86 mg/dL (ref 0.44–1.00)
GFR, Estimated: >60 mL/min (ref >60)
Glucose, Bld: 98 mg/dL (ref 70–99)
Potassium: 3.5 mmol/L (ref 3.5–5.1)
Sodium: 137 mmol/L (ref 135–145)
Total Bilirubin: 0.5 mg/dL (ref 0.3–1.2)
Total Protein: 7 g/dL (ref 6.5–8.1)

## 2022-07-24 LAB — MAGNESIUM: Magnesium: 2.1 mg/dL (ref 1.7–2.4)

## 2022-07-24 MED ORDER — IOHEXOL 300 MG/ML  SOLN
75.0000 mL | Freq: Once | INTRAMUSCULAR | Status: AC | PRN
  Administered 2022-07-24: 75 mL via INTRAVENOUS

## 2022-07-26 LAB — CEA: CEA: 1.7 ng/mL (ref 0.0–4.7)

## 2022-07-28 ENCOUNTER — Ambulatory Visit (HOSPITAL_COMMUNITY)
Admission: RE | Admit: 2022-07-28 | Discharge: 2022-07-28 | Disposition: A | Discharge status: Home / Self Care | Payer: Medicare HMO | Source: Ambulatory Visit | Attending: Internal Medicine | Admitting: Internal Medicine

## 2022-07-28 DIAGNOSIS — Z1231 Encounter for screening mammogram for malignant neoplasm of breast: Secondary | ICD-10-CM | POA: Diagnosis not present

## 2022-07-31 ENCOUNTER — Inpatient Hospital Stay (HOSPITAL_BASED_OUTPATIENT_CLINIC_OR_DEPARTMENT_OTHER): Payer: Medicare HMO | Admitting: Hematology

## 2022-07-31 VITALS — Resp 20 | Ht 64.0 in | Wt 138.0 lb

## 2022-07-31 DIAGNOSIS — Z87891 Personal history of nicotine dependence: Secondary | ICD-10-CM | POA: Diagnosis not present

## 2022-07-31 DIAGNOSIS — C3412 Malignant neoplasm of upper lobe, left bronchus or lung: Secondary | ICD-10-CM

## 2022-07-31 DIAGNOSIS — C3490 Malignant neoplasm of unspecified part of unspecified bronchus or lung: Secondary | ICD-10-CM

## 2022-07-31 DIAGNOSIS — C182 Malignant neoplasm of ascending colon: Secondary | ICD-10-CM | POA: Diagnosis not present

## 2022-07-31 DIAGNOSIS — C3432 Malignant neoplasm of lower lobe, left bronchus or lung: Secondary | ICD-10-CM | POA: Diagnosis not present

## 2022-07-31 NOTE — Patient Instructions (Addendum)
[facility]  Discharge Instructions  You were seen and examined today by Dr. Delton Coombes.  Dr. Delton Coombes discussed your most recent lab work and CT scan which revealed that everything looks good.  Follow-up as scheduled in 6 months with labs and scan.    Thank you for choosing [facility] to provide your oncology and hematology care.   To afford each patient quality time with our provider, please arrive at least 15 minutes before your scheduled appointment time. You may need to reschedule your appointment if you arrive late (10 or more minutes). Arriving late affects you and other patients whose appointments are after yours.  Also, if you miss three or more appointments without notifying the office, you may be dismissed from the clinic at the provider's discretion.    Again, thank you for choosing [facility].  Our hope is that these requests will decrease the amount of time that you wait before being seen by our physicians.   If you have a lab appointment with the [facility] please come in thru the Main Entrance and check in at the main information desk.           _____________________________________________________________  Should you have questions after your visit to [facility], please contact our office at [phone] and follow the prompts.  Our office hours are 8:00 a.m. to 4:30 p.m. Monday - Thursday and 8:00 a.m. to 2:30 p.m. Friday.  Please note that voicemails left after 4:00 p.m. may not be returned until the following business day.  We are closed weekends and all major holidays.  You do have access to a nurse 24-7, just call the main number to the clinic [phone] and do not press any options, hold on the line and a nurse will answer the phone.    For prescription refill requests, have your pharmacy contact our office and allow 72 hours.    Masks are optional in the cancer centers. If  you would like for your care team to wear a mask while they are taking care of you, please let them know. You may have one support person who is at least 69 years old accompany you for your appointments.

## 2022-08-01 NOTE — Progress Notes (Signed)
[place], [place] [address]   CLINIC:  Medical Oncology/Hematology  PCP:  Sara Jewel, MD [address] / [place] [address] [phone]   REASON FOR VISIT:  Follow-up for colon cancer  PRIOR THERAPY: Right hemicolectomy on 04/09/2020  NGS Results: [employer] MSI--high; BRAF V600E; MLH1 hyper methylation positive  CURRENT THERAPY: Surveillance  BRIEF ONCOLOGIC HISTORY:  Oncology History  Cancer of ascending colon ([place])  05/10/2020 Genetic Testing   BRAF Mutation Analysis:     05/11/2020 Genetic Testing   MLH1 Promoter Methylation Analysis:       CANCER STAGING:  Cancer Staging  No matching staging information was found for the patient.  INTERVAL HISTORY:  [name], a 70 y.o. female, seen for follow-up of colon and lung cancers.  Reports 100% energy levels.  Denies any cough or change in bowel habits.  REVIEW OF SYSTEMS:  Review of Systems  Constitutional:  Negative for appetite change and fatigue.  Gastrointestinal:  Negative for diarrhea.  Psychiatric/Behavioral:  Negative for depression. The patient is not nervous/anxious.   All other systems reviewed and are negative.   PAST MEDICAL/SURGICAL HISTORY:  Past Medical History:  Diagnosis Date   Amnesia 04/17/2019   Hemorrhoid 09/28/2013   Hyperlipidemia    Osteoporosis    Syncope and collapse    TGA (transient global amnesia) 04/18/2019   TIA (transient ischemic attack) 04/17/2019   Tobacco abuse    Past Surgical History:  Procedure Laterality Date   BIOPSY  03/02/2020   Procedure: BIOPSY;  Surgeon: Sara Houston, MD;  [facility];  Service: Endoscopy;;  ascending colon mass   COLON SURGERY     COLONOSCOPY WITH PROPOFOL N/A 03/02/2020   Procedure: COLONOSCOPY WITH PROPOFOL;  Surgeon: Sara Houston, MD;  [facility];  Service: Endoscopy;  Laterality: N/A;  1055   COLONOSCOPY WITH PROPOFOL N/A 01/09/2021    Procedure: COLONOSCOPY WITH PROPOFOL;  Surgeon: Sara Houston, MD;  [facility];  Service: Endoscopy;  Laterality: N/A;  [place] BLOCK  04/05/2021   Procedure: INTERCOSTAL NERVE BLOCK;  Surgeon: Sara Nakayama, MD;  Location: [facility];  Service: Thoracic;;   KNEE SX     LAPAROSCOPIC PARTIAL COLECTOMY N/A 04/09/2020   Procedure: LAPAROSCOPIC PARTIAL COLECTOMY;  Surgeon: Sara Cagey, MD;  Location: [facility];  Service: General;  Laterality: N/A;   left eye surgery      LYMPH NODE DISSECTION Left 04/05/2021   Procedure: LYMPH NODE DISSECTION;  Surgeon: Sara Nakayama, MD;  Location: [facility];  Service: Thoracic;  Laterality: Left;   POLYPECTOMY  03/02/2020   Procedure: POLYPECTOMY;  Surgeon: Sara Houston, MD;  [facility];  Service: Endoscopy;;   POLYPECTOMY  01/09/2021   Procedure: POLYPECTOMY;  Surgeon: Sara Houston, MD;  [facility];  Service: Endoscopy;;   THORACOTOMY  04/05/2021   Procedure: THORACOTOMY MAJOR;  Surgeon: Sara Nakayama, MD;  Location: [facility];  Service: Thoracic;;   TUBAL LIGATION     VIDEO ASSISTED THORACOSCOPY Left 04/10/2021   Procedure: VIDEO ASSISTED THORACOSCOPY, EVACUATION OF HEMOTHORAX, LEFT LOWER LOBE WEDGE RESECTION;  Surgeon: Sara Nakayama, MD;  Location: [facility];  Service: Thoracic;  Laterality: Left;   XI ROBOTIC ASSISTED THORACOSCOPY- SEGMENTECTOMY Left 04/05/2021   Procedure: XI ROBOTIC ASSISTED THORACOSCOPY-LEFT LOWER LOBE SUPERIOR SEGMENTECTOMY;  Surgeon: Sara Nakayama, MD;  Location[facility];  Service: Thoracic;  Laterality: Left;    SOCIAL HISTORY:  Social History   Socioeconomic History   Marital status: Married    Spouse name: Not on file   Number of children: 3   Years of education: Not on file   Highest education level: Not on file  Occupational History   Occupation: retired  Tobacco Use   Smoking status: Former    Packs/day: 1.00    Years: 40.00     Total pack years: 40.00    Types: Cigarettes    Quit date: 03/28/2021    Years since quitting: 1.3   Smokeless tobacco: Never  Vaping Use   Vaping Use: Never used  Substance and Sexual Activity   Alcohol use: No   Drug use: No   Sexual activity: Not Currently    Birth control/protection: Surgical, Post-menopausal    Comment: tubal  Other Topics Concern   Not on file  Social History Narrative   Not on file   Social Determinants of Health   Financial Resource Strain: Low Risk  (05/01/2020)   Overall Financial Resource Strain (CARDIA)    Difficulty of Paying Living Expenses: Not hard at all  Food Insecurity: No Food Insecurity (05/01/2020)   Hunger Vital Sign    Worried About Running Out of Food in the Last Year: Never true    [place] in the Last Year: Never true  Transportation Needs: No Transportation Needs (05/01/2020)   PRAPARE - Hydrologist (Medical): No    Lack of Transportation (Non-Medical): No  Physical Activity: Inactive (05/01/2020)   Exercise Vital Sign    Days of Exercise per Week: 0 days    Minutes of Exercise per Session: 0 min  Stress: No Stress Concern Present (05/01/2020)   [place]    Feeling of Stress : Only a little  Social Connections: Moderately Isolated (05/01/2020)   Social Connection and Isolation Panel [NHANES]    Frequency of Communication with Friends and Family: More than three times a week    Frequency of Social Gatherings with Friends and Family: Once a week    Attends Religious Services: Never    Marine scientist or Organizations: No    Attends Archivist Meetings: Never    Marital Status: Married  Human resources officer Violence: Not At Risk (05/01/2020)   Humiliation, Afraid, Rape, and Kick questionnaire    Fear of Current or Ex-Partner: No    Emotionally Abused: No    Physically Abused: No    Sexually Abused: No    FAMILY  HISTORY:  Family History  Problem Relation Age of Onset   Heart disease Father    Hypertension Father    Diabetes Father    Cancer Brother    Healthy Son    Healthy Son    Healthy Daughter    Ulcers Mother    Kidney failure Maternal Grandfather    Throat cancer Paternal Grandfather    Cancer Brother    Breast cancer Neg Hx     CURRENT MEDICATIONS:  Current Outpatient Medications  Medication Sig Dispense Refill   acetaminophen (TYLENOL) 500 MG tablet Take 2 tablets (1,000 mg total) by mouth every 6 (six) hours as needed for mild pain, fever or headache. 30 tablet 0   alendronate (FOSAMAX) 70 MG tablet Take 70 mg by mouth daily.     calcium carbonate (TUMS - DOSED IN MG ELEMENTAL  CALCIUM) 500 MG chewable tablet Chew 1 tablet by mouth as needed for indigestion or heartburn.     Cyanocobalamin (VITAMIN B 12) 500 MCG TABS 1 tablet     Ferrous Sulfate (IRON) 325 (65 Fe) MG TABS Take 325 mg by mouth every other day. In the morning     Melatonin 10 MG TABS Take 10 mg by mouth at bedtime as needed (sleep).     Multiple Vitamins-Minerals (MULTIVITAMIN WITH MINERALS) tablet Take 1 tablet by mouth daily. Multivitamin for Women 50+     Omega-3 Fatty Acids (FISH OIL) 1000 MG CAPS Take 2,000 mg by mouth in the morning.     Oyster Shell Calcium 500 MG TABS 1 tablet with meals     rosuvastatin (CRESTOR) 5 MG tablet Take 1 tablet (5 mg total) by mouth daily. 90 tablet 1   No current facility-administered medications for this visit.    ALLERGIES:  No Known Allergies  PHYSICAL EXAM:  Performance status (ECOG): 0 - Asymptomatic  Vitals:   07/31/22 1400  Resp: 20   Wt Readings from Last 3 Encounters:  07/31/22 138 lb (62.6 kg)  04/24/22 139 lb 9.6 oz (63.3 kg)  01/09/22 139 lb 8 oz (63.3 kg)   Physical Exam Vitals reviewed.  Constitutional:      Appearance: Normal appearance.  Cardiovascular:     Rate and Rhythm: Normal rate and regular rhythm.     Pulses: Normal pulses.     Heart  sounds: Normal heart sounds.  Pulmonary:     Effort: Pulmonary effort is normal.     Breath sounds: Normal breath sounds.  Abdominal:     Palpations: Abdomen is soft. There is no hepatomegaly, splenomegaly or mass.     Tenderness: There is no abdominal tenderness.  Lymphadenopathy:     Cervical: No cervical adenopathy.     Right cervical: No superficial cervical adenopathy.    Left cervical: No superficial cervical adenopathy.     Upper Body:     Right upper body: No supraclavicular adenopathy.     Left upper body: No supraclavicular adenopathy.  Neurological:     General: No focal deficit present.     Mental Status: She is alert and oriented to person, place, and time.  Psychiatric:        Mood and Affect: Mood normal.        Behavior: Behavior normal.      LABORATORY DATA:  I have reviewed the labs as listed.     Latest Ref Rng & Units 07/24/2022    2:44 PM 04/10/2022    1:19 PM 12/31/2021    1:13 PM  CBC  WBC 4.0 - 10.5 K/uL 5.0  4.9  4.9   Hemoglobin 12.0 - 15.0 g/dL 12.4  13.8  12.9   Hematocrit 36.0 - 46.0 % 36.7  41.1  38.8   Platelets 150 - 400 K/uL 243  284  273       Latest Ref Rng & Units 07/24/2022    2:44 PM 04/10/2022    1:19 PM 12/31/2021    1:13 PM  CMP  Glucose 70 - 99 mg/dL 98  90  119   BUN 8 - 23 mg/dL _0 Creatinine 0.44 - 1.00 mg/dL 0.86  0.63  0.77   Sodium 135 - 145 mmol/L 137  140  138   Potassium 3.5 - 5.1 mmol/L 3.5  3.8  3.8   Chloride 98 - 111 mmol/L 103  105  105   CO2 22 - 32 mmol/L _0 Calcium 8.9 - 10.3 mg/dL 9.4  9.7  9.5   Total Protein 6.5 - 8.1 g/dL 7.0  7.9  7.2   Total Bilirubin 0.3 - 1.2 mg/dL 0.5  1.0  0.4   Alkaline Phos 38 - 126 U/L 57  76  77   AST 15 - 41 U/L _1 ALT 0 - 44 U/L _2 DIAGNOSTIC IMAGING:  I have independently reviewed the scans and discussed with the patient. MM 3D SCREEN BREAST BILATERAL  Result Date: 07/29/2022 CLINICAL DATA:  Screening. EXAM: DIGITAL SCREENING  BILATERAL MAMMOGRAM WITH TOMOSYNTHESIS AND CAD TECHNIQUE: Bilateral screening digital craniocaudal and mediolateral oblique mammograms were obtained. Bilateral screening digital breast tomosynthesis was performed. The images were evaluated with computer-aided detection. COMPARISON:  Previous exam(s). ACR Breast Density Category c: The breast tissue is heterogeneously dense, which may obscure small masses. FINDINGS: There are no findings suspicious for malignancy. IMPRESSION: No mammographic evidence of malignancy. A result letter of this screening mammogram will be mailed directly to the patient. RECOMMENDATION: Screening mammogram in one year. (Code:SM-B-01Y) BI-RADS CATEGORY  1: Negative. Electronically Signed   By: Abelardo Diesel M.D.   On: 07/29/2022 11:24   CT Chest W Contrast  Result Date: 07/26/2022 CLINICAL DATA:  Non-small cell lung cancer, monitor. Previous left lung surgery. * Tracking Code: BO * EXAM: CT CHEST WITH CONTRAST TECHNIQUE: Multidetector CT imaging of the chest was performed during intravenous contrast administration. RADIATION DOSE REDUCTION: This exam was performed according to the departmental dose-optimization program which includes automated exposure control, adjustment of the mA and/or kV according to patient size and/or use of iterative reconstruction technique. CONTRAST:  21m OMNIPAQUE IOHEXOL 300 MG/ML  SOLN COMPARISON:  Chest CT 12/31/2021 and 07/25/2021. Abdominal CT 04/10/2022. FINDINGS: Cardiovascular: No acute vascular findings. Mild atherosclerosis of the aorta, great vessels and coronary arteries. The heart size is normal. There is no pericardial effusion. Mediastinum/Nodes: There are no enlarged mediastinal, hilar or axillary lymph nodes.Small mediastinal lymph nodes are unchanged. The thyroid gland, trachea and esophagus demonstrate no significant findings. Lungs/Pleura: Postsurgical changes from previous subtotal left lower lobe wedge resection with associated chronic  volume loss. Further decrease in size of the intraparenchymal fluid collection within the left lower lobe which is now inseparable from the adjacent complex left pleural effusion or pleural thickening. Possible new tiny right lower lobe nodule measuring 3 mm on image 76/4. No other pulmonary nodules are identified. Upper abdomen: The visualized upper abdomen appears stable without suspicious findings. There are cysts within the left hepatic lobe. No adrenal mass. Musculoskeletal/Chest wall: There is no chest wall mass or suspicious osseous finding. Stable chronic thoracotomy changes on the left and mild thoracolumbar spondylosis. IMPRESSION: 1. Further decrease in size of the intraparenchymal fluid collection within the left lower lobe which is now inseparable from the adjacent complex left pleural effusion or pleural thickening. 2. No evidence of local recurrence or metastatic disease. 3. Possible new tiny right lower lobe pulmonary nodule, nonspecific but likely benign as an isolated finding. Attention on follow-up recommended. 4.  Aortic Atherosclerosis (ICD10-I70.0). Electronically Signed   By: WRichardean SaleM.D.   On: 07/26/2022 15:18     ASSESSMENT:  1.  Stage II (T3N0) right colon adenocarcinoma: -Colonoscopy on 03/02/2020 biopsy of the ascending colon mass consistent with adenocarcinoma.  Sessile serrated polyp in the  hepatic flexure, tubular adenoma in the hepatic, transverse and proximal colon.  Tubular adenoma in the rectum. -CTAP on 03/14/2020 shows 4.6 cm intraluminal mass in the right colon consistent with known colon cancer.  No evidence of metastatic disease in the abdomen or pelvis. -Preoperative CEA on 04/06/2020 was 3.6. -Right hemicolectomy on 04/09/2020 with pathology showing 3.5 cm moderately differentiated adenocarcinoma, separate focus of invasive adenocarcinoma 0.5 cm arising in a tubular adenoma with high-grade dysplasia, 0/32 lymph nodes involved, no perforation, no  lymphovascular/perineural invasion, margins negative, PT3PN0. -MMR shows loss of nuclear expression of MLH1 and PMS2.  MSI was high. -BRAF V600 and MLH1 hypermethylation positive.  Indicates sporadic nature. -CT chest with contrast on 05/15/2020 with no metastatic disease.  3 mm pulmonary nodule of the dependent right lower lobe and 7 mm groundglass opacity of the dependent superior segment of the left lower lobe.   2.  Social/family history: -She is a retired Psychiatric nurse. -Current active smoker, 1 pack/day for 99 years. -One brother had prostate cancer.  Another brother had multiple different type of cancers, known to the patient.  Paternal grandfather had throat cancer and a maternal aunt had stomach cancer.  3.  Stage I (T1AN0) adenocarcinoma of the left lower lobe of the lung: - Pathology on 04/05/2021 showed invasive adenocarcinoma, 0.7 cm, margins negative.  Lymph nodes from station 7, 9, 10, 4L, 8, 12, 5, 11, 13 were negative. - Given the size of the tumor, no indication for adjuvant therapy.   PLAN:  1.  Stage II (T3N0) right colon adenocarcinoma: -She does not have any change in bowel habits or bleeding per rectum. - CTAP on 04/10/2022 with no evidence of recurrence. - CEA on 07/24/2022 was 1.7.  CBC and CMP were normal. - Will plan on repeating CTAP with contrast in 6 months.   2.  Stage I (T1AN0) adenocarcinoma of the left lower lobe of the lung: - CT chest (07/24/2022): Further decrease in size of intraparenchymal fluid collection in the left lower lobe with no evidence of recurrence. - Recommend CT chest to be repeated in 6 months.   Orders placed this encounter:  Orders Placed This Encounter  Procedures   CT CHEST ABDOMEN PELVIS W CONTRAST   CBC with Differential/Platelet   Comprehensive metabolic panel   CEA      Derek Jack, MD [place] [phone]

## 2022-08-04 ENCOUNTER — Ambulatory Visit: Payer: Self-pay | Admitting: Surgery

## 2022-08-04 DIAGNOSIS — K642 Third degree hemorrhoids: Secondary | ICD-10-CM | POA: Diagnosis not present

## 2022-08-04 DIAGNOSIS — Z87891 Personal history of nicotine dependence: Secondary | ICD-10-CM | POA: Diagnosis not present

## 2022-08-04 DIAGNOSIS — Z85038 Personal history of other malignant neoplasm of large intestine: Secondary | ICD-10-CM | POA: Diagnosis not present

## 2022-10-08 ENCOUNTER — Other Ambulatory Visit: Payer: Self-pay | Admitting: Surgery

## 2022-10-08 DIAGNOSIS — K643 Fourth degree hemorrhoids: Secondary | ICD-10-CM | POA: Diagnosis not present

## 2022-10-08 DIAGNOSIS — K644 Residual hemorrhoidal skin tags: Secondary | ICD-10-CM | POA: Diagnosis not present

## 2022-10-08 HISTORY — PX: HEMORRHOID SURGERY: SHX153

## 2022-10-30 DIAGNOSIS — Z85828 Personal history of other malignant neoplasm of skin: Secondary | ICD-10-CM | POA: Diagnosis not present

## 2022-10-30 DIAGNOSIS — L814 Other melanin hyperpigmentation: Secondary | ICD-10-CM | POA: Diagnosis not present

## 2022-10-30 DIAGNOSIS — Z08 Encounter for follow-up examination after completed treatment for malignant neoplasm: Secondary | ICD-10-CM | POA: Diagnosis not present

## 2022-10-30 DIAGNOSIS — L821 Other seborrheic keratosis: Secondary | ICD-10-CM | POA: Diagnosis not present

## 2022-10-30 DIAGNOSIS — D225 Melanocytic nevi of trunk: Secondary | ICD-10-CM | POA: Diagnosis not present

## 2022-12-22 ENCOUNTER — Ambulatory Visit (INDEPENDENT_AMBULATORY_CARE_PROVIDER_SITE_OTHER): Payer: Medicare HMO | Admitting: Adult Health

## 2022-12-22 ENCOUNTER — Other Ambulatory Visit (HOSPITAL_COMMUNITY)
Admission: RE | Admit: 2022-12-22 | Discharge: 2022-12-22 | Disposition: A | Discharge status: Home / Self Care | Payer: Medicare HMO | Source: Ambulatory Visit | Attending: Adult Health | Admitting: Adult Health

## 2022-12-22 ENCOUNTER — Encounter: Payer: Self-pay | Admitting: Adult Health

## 2022-12-22 VITALS — BP 132/84 | HR 72 | Ht 63.0 in | Wt 136.5 lb

## 2022-12-22 DIAGNOSIS — Z01419 Encounter for gynecological examination (general) (routine) without abnormal findings: Secondary | ICD-10-CM | POA: Insufficient documentation

## 2022-12-22 NOTE — Progress Notes (Signed)
Patient ID: Sara Coffey, female   DOB: 26-Mar-1951, 72 y.o.   MRN: [id number] History of Present Illness: Sara Coffey is a 72 year old white female, married, PM in for a well woman gyn exam and pap.  PCP is Dr Jacqulyn Bath   Current Medications, Allergies, Past Medical History, Past Surgical History, Family History and Social History were reviewed in [employer] [facility] electronic medical record.     Review of Systems: Patient denies any headaches, hearing loss, fatigue, blurred vision, shortness of breath, chest pain, abdominal pain, problems with urination, or intercourse(not active). No joint pain or mood swings.  Since having hemorrhoid surgery will have 4-5 bowel movements a day. They are soft to loose.    Physical Exam:BP 132/84 (BP Location: Left Arm, Patient Position: Sitting, Cuff Size: Normal)   Pulse 72   Ht 5\' 3"  (1.6 m)   Wt 136 lb 8 oz (61.9 kg)   BMI 24.18 kg/m   General:  Well developed, well nourished, no acute distress Skin:  Warm and dry Neck:  Midline trachea, normal thyroid, good ROM, no lymphadenopathy,no carotid bruits heard Lungs; Clear to auscultation bilaterally Breast:  No dominant palpable mass, retraction, or nipple discharge Cardiovascular: Regular rate and rhythm Abdomen:  Soft, non tender, no hepatosplenomegaly Pelvic:  External genitalia is normal in appearance, no lesions.  The vagina is pale. Urethra has no lesions or masses. The cervix is smooth, pap with HR HPV genotyping performed.  Uterus is felt to be normal size, shape, and contour.  No adnexal masses or tenderness noted.Bladder is non tender, no masses felt. Rectal: Deferred Extremities/musculoskeletal:  No swelling or varicosities noted, no clubbing or cyanosis Psych:  No mood changes, alert and cooperative,seems happy AA is 1 Fall risk is low    12/22/2022   11:32 AM 05/27/2021    9:35 AM 03/27/2020    9:30 AM  Depression screen PHQ 2/9  Decreased Interest 1 0 0  Down, Depressed, Hopeless 1 0 0   PHQ - 2 Score 2 0 0  Altered sleeping 0  0  Tired, decreased energy 1  0  Change in appetite 0  0  Feeling bad or failure about yourself  0  0  Trouble concentrating 0  0  Moving slowly or fidgety/restless 0  0  Suicidal thoughts 0  0  PHQ-9 Score 3  0  Difficult doing work/chores   Not difficult at all       12/22/2022   11:32 AM 03/22/2020   12:00 PM  GAD 7 : Generalized Anxiety Score  Nervous, Anxious, on Edge 1 0  Control/stop worrying 1 0  Worry too much - different things 1 0  Trouble relaxing 0 0  Restless 0 0  Easily annoyed or irritable 0 0  Afraid - awful might happen 0 0  Total GAD 7 Score 3 0  Anxiety Difficulty  Not difficult at all      Upstream - 12/22/22 1143       Pregnancy Intention Screening   Does the patient want to become pregnant in the next year? N/A    Does the patient's partner want to become pregnant in the next year? N/A    Would the patient like to discuss contraceptive options today? N/A      Contraception Wrap Up   Current Method Female Sterilization    End Method Female Sterilization    Contraception Counseling Provided No            Examination chaperoned  by Malachy Mood  LPN  Impression and Plan: 1. Encounter for gynecological examination with Papanicolaou smear of cervix Pap sent If pap is normal can be her last Physical with PCP Mammogram was normal 07/28/22 Colonoscopy per GI  - Cytology - PAP( [place])  Stay active

## 2022-12-24 LAB — CYTOLOGY - PAP
Comment: NEGATIVE
Diagnosis: NEGATIVE
High risk HPV: NEGATIVE

## 2023-01-14 DIAGNOSIS — Z8719 Personal history of other diseases of the digestive system: Secondary | ICD-10-CM | POA: Diagnosis not present

## 2023-01-14 DIAGNOSIS — Z0000 Encounter for general adult medical examination without abnormal findings: Secondary | ICD-10-CM | POA: Diagnosis not present

## 2023-01-14 DIAGNOSIS — Z9181 History of falling: Secondary | ICD-10-CM | POA: Diagnosis not present

## 2023-01-14 DIAGNOSIS — E782 Mixed hyperlipidemia: Secondary | ICD-10-CM | POA: Diagnosis not present

## 2023-01-14 DIAGNOSIS — D649 Anemia, unspecified: Secondary | ICD-10-CM | POA: Diagnosis not present

## 2023-01-14 DIAGNOSIS — Z9889 Other specified postprocedural states: Secondary | ICD-10-CM | POA: Diagnosis not present

## 2023-01-14 DIAGNOSIS — G459 Transient cerebral ischemic attack, unspecified: Secondary | ICD-10-CM | POA: Diagnosis not present

## 2023-01-14 DIAGNOSIS — M818 Other osteoporosis without current pathological fracture: Secondary | ICD-10-CM | POA: Diagnosis not present

## 2023-01-14 DIAGNOSIS — Z85038 Personal history of other malignant neoplasm of large intestine: Secondary | ICD-10-CM | POA: Diagnosis not present

## 2023-01-14 DIAGNOSIS — Z85828 Personal history of other malignant neoplasm of skin: Secondary | ICD-10-CM | POA: Diagnosis not present

## 2023-01-14 DIAGNOSIS — I700 Atherosclerosis of aorta: Secondary | ICD-10-CM | POA: Diagnosis not present

## 2023-01-22 ENCOUNTER — Ambulatory Visit (HOSPITAL_COMMUNITY)
Admission: RE | Admit: 2023-01-22 | Discharge: 2023-01-22 | Disposition: A | Discharge status: Home / Self Care | Payer: Medicare HMO | Source: Ambulatory Visit | Attending: Hematology | Admitting: Hematology

## 2023-01-22 ENCOUNTER — Inpatient Hospital Stay: Payer: Medicare HMO | Attending: Hematology

## 2023-01-22 DIAGNOSIS — C3490 Malignant neoplasm of unspecified part of unspecified bronchus or lung: Secondary | ICD-10-CM | POA: Insufficient documentation

## 2023-01-22 DIAGNOSIS — C3412 Malignant neoplasm of upper lobe, left bronchus or lung: Secondary | ICD-10-CM | POA: Insufficient documentation

## 2023-01-22 DIAGNOSIS — R911 Solitary pulmonary nodule: Secondary | ICD-10-CM | POA: Diagnosis not present

## 2023-01-22 DIAGNOSIS — Z87891 Personal history of nicotine dependence: Secondary | ICD-10-CM | POA: Insufficient documentation

## 2023-01-22 DIAGNOSIS — K3189 Other diseases of stomach and duodenum: Secondary | ICD-10-CM | POA: Diagnosis not present

## 2023-01-22 DIAGNOSIS — C3431 Malignant neoplasm of lower lobe, right bronchus or lung: Secondary | ICD-10-CM | POA: Insufficient documentation

## 2023-01-22 LAB — COMPREHENSIVE METABOLIC PANEL
ALT: 20 U/L (ref 0–44)
AST: 22 U/L (ref 15–41)
Albumin: 4.2 g/dL (ref 3.5–5.0)
Alkaline Phosphatase: 60 U/L (ref 38–126)
Anion gap: 9 (ref 5–15)
BUN: 10 mg/dL (ref 8–23)
CO2: 25 mmol/L (ref 22–32)
Calcium: 9.3 mg/dL (ref 8.9–10.3)
Chloride: 104 mmol/L (ref 98–111)
Creatinine, Ser: 0.82 mg/dL (ref 0.44–1.00)
GFR, Estimated: >60 mL/min (ref >60)
Glucose, Bld: 94 mg/dL (ref 70–99)
Potassium: 3.5 mmol/L (ref 3.5–5.1)
Sodium: 138 mmol/L (ref 135–145)
Total Bilirubin: 0.7 mg/dL (ref 0.3–1.2)
Total Protein: 7.2 g/dL (ref 6.5–8.1)

## 2023-01-22 LAB — CBC WITH DIFFERENTIAL/PLATELET
Abs Immature Granulocytes: 0 10*3/uL (ref 0.00–0.07)
Basophils Absolute: 0.1 10*3/uL (ref 0.0–0.1)
Basophils Relative: 2 %
Eosinophils Absolute: 0.2 10*3/uL (ref 0.0–0.5)
Eosinophils Relative: 5 %
HCT: 39.7 % (ref 36.0–46.0)
Hemoglobin: 13.4 g/dL (ref 12.0–15.0)
Immature Granulocytes: 0 %
Lymphocytes Relative: 43 %
Lymphs Abs: 2.2 10*3/uL (ref 0.7–4.0)
MCH: 31.2 pg (ref 26.0–34.0)
MCHC: 33.8 g/dL (ref 30.0–36.0)
MCV: 92.3 fL (ref 80.0–100.0)
Monocytes Absolute: 0.3 10*3/uL (ref 0.1–1.0)
Monocytes Relative: 6 %
Neutro Abs: 2.2 10*3/uL (ref 1.7–7.7)
Neutrophils Relative %: 44 %
Platelets: 272 10*3/uL (ref 150–400)
RBC: 4.3 MIL/uL (ref 3.87–5.11)
RDW: 12.8 % (ref 11.5–15.5)
WBC: 5.1 10*3/uL (ref 4.0–10.5)
nRBC: 0 % (ref 0.0–0.2)

## 2023-01-22 MED ORDER — IOHEXOL 9 MG/ML PO SOLN
ORAL | Status: AC
  Filled 2023-01-22: qty 1000

## 2023-01-22 MED ORDER — IOHEXOL 300 MG/ML  SOLN
100.0000 mL | Freq: Once | INTRAMUSCULAR | Status: AC | PRN
  Administered 2023-01-22: 100 mL via INTRAVENOUS

## 2023-01-23 LAB — CEA: CEA: 2.1 ng/mL (ref 0.0–4.7)

## 2023-01-29 ENCOUNTER — Inpatient Hospital Stay: Payer: Medicare HMO | Admitting: Hematology

## 2023-01-29 DIAGNOSIS — C3431 Malignant neoplasm of lower lobe, right bronchus or lung: Secondary | ICD-10-CM | POA: Diagnosis not present

## 2023-01-29 DIAGNOSIS — C3490 Malignant neoplasm of unspecified part of unspecified bronchus or lung: Secondary | ICD-10-CM

## 2023-01-29 DIAGNOSIS — Z87891 Personal history of nicotine dependence: Secondary | ICD-10-CM | POA: Diagnosis not present

## 2023-01-29 NOTE — Progress Notes (Signed)
[facility] [address], [place] [address]    Clinic Day:  01/29/2023  Referring physician: Ollen Bowl, MD  Patient Care Team: Ollen Bowl, MD as PCP - General (Internal Medicine) Therese Sarah, RN as Oncology Nurse Navigator (Oncology)   ASSESSMENT & PLAN:   Assessment: 1.  Stage II (T3N0) right colon adenocarcinoma: -Colonoscopy on 03/02/2020 biopsy of the ascending colon mass consistent with adenocarcinoma.  Sessile serrated polyp in the hepatic flexure, tubular adenoma in the hepatic, transverse and proximal colon.  Tubular adenoma in the rectum. -CTAP on 03/14/2020 shows 4.6 cm intraluminal mass in the right colon consistent with known colon cancer.  No evidence of metastatic disease in the abdomen or pelvis. -Preoperative CEA on 04/06/2020 was 3.6. -Right hemicolectomy on 04/09/2020 with pathology showing 3.5 cm moderately differentiated adenocarcinoma, separate focus of invasive adenocarcinoma 0.5 cm arising in a tubular adenoma with high-grade dysplasia, 0/32 lymph nodes involved, no perforation, no lymphovascular/perineural invasion, margins negative, PT3PN0. -MMR shows loss of nuclear expression of MLH1 and PMS2.  MSI was high. -BRAF V600 and MLH1 hypermethylation positive.  Indicates sporadic nature. -CT chest with contrast on 05/15/2020 with no metastatic disease.  3 mm pulmonary nodule of the dependent right lower lobe and 7 mm groundglass opacity of the dependent superior segment of the left lower lobe.   2.  Social/family history: -She is a retired Arboriculturist. -Current active smoker, 1 pack/day for 50 years. -One brother had prostate cancer.  Another brother had multiple different type of cancers, known to the patient.  Paternal grandfather had throat cancer and a maternal aunt had stomach cancer.   3.  Stage I (T1AN0) adenocarcinoma of the left lower lobe of the lung: - Pathology on 04/05/2021 showed invasive adenocarcinoma, 0.7  cm, margins negative.  Lymph nodes from station 7, 9, 10, 4L, 8, 12, 5, 11, 13 were negative. - Given the size of the tumor, no indication for adjuvant therapy.    Plan: 1.  Stage II (T3N0) right colon adenocarcinoma: - She does not report any change in bowel habits, bleeding per rectum or melena. - CT [facility] on 01/22/2023: No evidence of metastatic disease in the abdomen or pelvis.  Prior  right hemicolectomy with ileocolic anastomosis.  Stable 3 mm hypodensity in the pancreatic head likely reflecting a sidebranch IPMN. - Labs from 01/22/2023: Normal LFTs, CBC and CEA. - Plan to repeat CEA level in 6 months.  Plan to repeat imaging in 1 year.   2.  Stage I (T1AN0) adenocarcinoma of the left lower lobe of the lung: - CT chest on 01/22/2023: Postsurgical changes of the left lower lobe with decrease in size of intraparenchymal fluid collection.  Stable 3 mm right lower lobe lung nodule.  No evidence of recurrence. - Recommend follow-up chest CT without contrast in 6 months.    Orders Placed This Encounter  Procedures   CT Chest Wo Contrast    Standing Status:   Future    Standing Expiration Date:   01/29/2024    Order Specific Question:   Preferred imaging location?    Answer:   [facility]    Order Specific Question:   Release to patient    Answer:   Immediate [1]   CBC with Differential/Platelet    Standing Status:   Future    Standing Expiration Date:   01/29/2024    Order Specific Question:   Release to patient    Answer:  Immediate   Comprehensive metabolic panel    Standing Status:   Future    Standing Expiration Date:   01/29/2024    Order Specific Question:   Release to patient    Answer:   Immediate   CEA    Standing Status:   Future    Standing Expiration Date:   01/29/2024      I,Katie Daubenspeck,acting as a scribe for Doreatha Massed, MD.,have documented all relevant documentation on the behalf of Doreatha Massed, MD,as directed by  Doreatha Massed, MD  while in the presence of Doreatha Massed, MD.   I, Doreatha Massed MD, have reviewed the above documentation for accuracy and completeness, and I agree with the above.   Doreatha Massed, MD   6/13/20244:36 PM  CHIEF COMPLAINT:   Diagnosis: colon cancer    Cancer Staging  No matching staging information was found for the patient.   Prior Therapy: Right hemicolectomy on 04/09/2020   Current Therapy:  Surveillance    HISTORY OF PRESENT ILLNESS:   Oncology History  Cancer of ascending colon (HCC)  05/10/2020 Genetic Testing   BRAF Mutation Analysis:     05/11/2020 Genetic Testing   MLH1 Promoter Methylation Analysis:        INTERVAL HISTORY:   Sara Coffey is a 72 y.o. female presenting to clinic today for follow up of colon cancer. She was last seen by me on 07/31/22.  Since her last visit, she underwent surveillance CT C/A/P on 01/22/23 showing: slight decrease in size of intraparenchymal fluid collection within LLL, which is inseparable from adjacent complex left pleural effusion/thickening; stable 3 mm RLL pulmonary nodule; no new suspicious nodules or masses; no suspicious enhancing nodularity along right hemicolectomy suture line; no evidence of metastatic disease within abdomen or pelvis; stable 3 mm hypodensity in pancreatic head.  Today, she states that she is doing well overall. Her appetite level is at 100%. Her energy level is at 100%.  PAST MEDICAL HISTORY:   Past Medical History: Past Medical History:  Diagnosis Date   Amnesia 04/17/2019   Hemorrhoid 09/28/2013   Hyperlipidemia    Osteoporosis    Syncope and collapse    TGA (transient global amnesia) 04/18/2019   TIA (transient ischemic attack) 04/17/2019   Tobacco abuse     Surgical History: Past Surgical History:  Procedure Laterality Date   BIOPSY  03/02/2020   Procedure: BIOPSY;  Surgeon: Malissa Hippo, MD;  [facility];  Service: Endoscopy;;  ascending colon mass   COLON  SURGERY     COLONOSCOPY WITH PROPOFOL N/A 03/02/2020   Procedure: COLONOSCOPY WITH PROPOFOL;  Surgeon: Malissa Hippo, MD;  [facility];  Service: Endoscopy;  Laterality: N/A;  1055   COLONOSCOPY WITH PROPOFOL N/A 01/09/2021   Procedure: COLONOSCOPY WITH PROPOFOL;  Surgeon: Malissa Hippo, MD;  [facility];  Service: Endoscopy;  Laterality: N/A;  945   HEMORRHOID SURGERY  10/08/2022   INTERCOSTAL NERVE BLOCK  04/05/2021   Procedure: INTERCOSTAL NERVE BLOCK;  Surgeon: Loreli Slot, MD;  Location: [facility] OR;  Service: Thoracic;;   KNEE SX     LAPAROSCOPIC PARTIAL COLECTOMY N/A 04/09/2020   Procedure: LAPAROSCOPIC PARTIAL COLECTOMY;  Surgeon: Lucretia Roers, MD;  Location: [facility] ORS;  Service: General;  Laterality: N/A;   left eye surgery      LYMPH NODE DISSECTION Left 04/05/2021   Procedure: LYMPH NODE DISSECTION;  Surgeon: Loreli Slot, MD;  Location: [facility] OR;  Service: Thoracic;  Laterality: Left;   POLYPECTOMY  03/02/2020   Procedure: POLYPECTOMY;  Surgeon: Malissa Hippo, MD;  [facility];  Service: Endoscopy;;   POLYPECTOMY  01/09/2021   Procedure: POLYPECTOMY;  Surgeon: Malissa Hippo, MD;  [facility];  Service: Endoscopy;;   THORACOTOMY  04/05/2021   Procedure: THORACOTOMY MAJOR;  Surgeon: Loreli Slot, MD;  Location: [facility] OR;  Service: Thoracic;;   TUBAL LIGATION     VIDEO ASSISTED THORACOSCOPY Left 04/10/2021   Procedure: VIDEO ASSISTED THORACOSCOPY, EVACUATION OF HEMOTHORAX, LEFT LOWER LOBE WEDGE RESECTION;  Surgeon: Loreli Slot, MD;  Location: [facility] OR;  Service: Thoracic;  Laterality: Left;   XI ROBOTIC ASSISTED THORACOSCOPY- SEGMENTECTOMY Left 04/05/2021   Procedure: XI ROBOTIC ASSISTED THORACOSCOPY-LEFT LOWER LOBE SUPERIOR SEGMENTECTOMY;  Surgeon: Loreli Slot, MD;  Location: [facility] OR;  Service: Thoracic;  Laterality: Left;    Social History: Social History   Socioeconomic History    Marital status: Married    Spouse name: Not on file   Number of children: 3   Years of education: Not on file   Highest education level: Not on file  Occupational History   Occupation: retired  Tobacco Use   Smoking status: Former    Packs/day: 1.00    Years: 40.00    Additional pack years: 0.00    Total pack years: 40.00    Types: Cigarettes    Quit date: 03/28/2021    Years since quitting: 1.8   Smokeless tobacco: Never  Vaping Use   Vaping Use: Some days  Substance and Sexual Activity   Alcohol use: No   Drug use: No   Sexual activity: Not Currently    Birth control/protection: Surgical, Post-menopausal    Comment: tubal  Other Topics Concern   Not on file  Social History Narrative   Not on file   Social Determinants of Health   Financial Resource Strain: Low Risk  (12/22/2022)   Overall Financial Resource Strain (CARDIA)    Difficulty of Paying Living Expenses: Not very hard  Food Insecurity: No Food Insecurity (12/22/2022)   Hunger Vital Sign    Worried About Running Out of Food in the Last Year: Never true    Ran Out of Food in the Last Year: Never true  Transportation Needs: No Transportation Needs (12/22/2022)   PRAPARE - Administrator, Civil Service (Medical): No    Lack of Transportation (Non-Medical): No  Physical Activity: Insufficiently Active (12/22/2022)   Exercise Vital Sign    Days of Exercise per Week: 5 days    Minutes of Exercise per Session: 20 min  Stress: No Stress Concern Present (12/22/2022)   [employer] of Occupational Health - Occupational Stress Questionnaire    Feeling of Stress : Only a little  Social Connections: Moderately Isolated (12/22/2022)   Social Connection and Isolation Panel [NHANES]    Frequency of Communication with Friends and Family: More than three times a week    Frequency of Social Gatherings with Friends and Family: Once a week    Attends Religious Services: Never    Database administrator or Organizations:  No    Attends Banker Meetings: Never    Marital Status: Married  Catering manager Violence: Not At Risk (12/22/2022)   Humiliation, Afraid, Rape, and Kick questionnaire    Fear of Current or Ex-Partner: No    Emotionally Abused: No    Physically Abused: No    Sexually Abused: No  Family History: Family History  Problem Relation Age of Onset   Heart disease Father    Hypertension Father    Diabetes Father    Cancer Brother    Healthy Son    Healthy Son    Healthy Daughter    Ulcers Mother    Kidney failure Maternal Grandfather    Throat cancer Paternal Grandfather    Cancer Brother    Breast cancer Neg Hx     Current Medications:  Current Outpatient Medications:    acetaminophen (TYLENOL) 500 MG tablet, Take 2 tablets (1,000 mg total) by mouth every 6 (six) hours as needed for mild pain, fever or headache., Disp: 30 tablet, Rfl: 0   alendronate (FOSAMAX) 70 MG tablet, Take 70 mg by mouth once a week., Disp: , Rfl:    calcium carbonate (TUMS - DOSED IN MG ELEMENTAL CALCIUM) 500 MG chewable tablet, Chew 1 tablet by mouth as needed for indigestion or heartburn., Disp: , Rfl:    Cyanocobalamin (VITAMIN B 12) 500 MCG TABS, 1 tablet, Disp: , Rfl:    Ferrous Sulfate (IRON) 325 (65 Fe) MG TABS, Take 325 mg by mouth every other day. In the morning, Disp: , Rfl:    Melatonin 10 MG TABS, Take 10 mg by mouth at bedtime as needed (sleep)., Disp: , Rfl:    Multiple Vitamins-Minerals (MULTIVITAMIN WITH MINERALS) tablet, Take 1 tablet by mouth daily. Multivitamin for Women 50+, Disp: , Rfl:    Omega-3 Fatty Acids (FISH OIL) 1000 MG CAPS, Take 2,000 mg by mouth in the morning., Disp: , Rfl:    Oyster Shell Calcium 500 MG TABS, 1 tablet with meals, Disp: , Rfl:    rosuvastatin (CRESTOR) 5 MG tablet, Take 1 tablet (5 mg total) by mouth daily., Disp: 90 tablet, Rfl: 1   Allergies: No Known Allergies  REVIEW OF SYSTEMS:   Review of Systems  Constitutional:  Negative for  chills, fatigue and fever.  HENT:   Negative for lump/mass, mouth sores, nosebleeds, sore throat and trouble swallowing.   Eyes:  Negative for eye problems.  Respiratory:  Negative for cough and shortness of breath.   Cardiovascular:  Negative for chest pain, leg swelling and palpitations.  Gastrointestinal:  Negative for abdominal pain, constipation, diarrhea, nausea and vomiting.  Genitourinary:  Negative for bladder incontinence, difficulty urinating, dysuria, frequency, hematuria and nocturia.   Musculoskeletal:  Negative for arthralgias, back pain, flank pain, myalgias and neck pain.  Skin:  Negative for itching and rash.  Neurological:  Negative for dizziness, headaches and numbness.  Hematological:  Does not bruise/bleed easily.  Psychiatric/Behavioral:  Negative for depression, sleep disturbance and suicidal ideas. The patient is not nervous/anxious.   All other systems reviewed and are negative.    VITALS:   There were no vitals taken for this visit.  Wt Readings from Last 3 Encounters:  12/22/22 136 lb 8 oz (61.9 kg)  07/31/22 138 lb (62.6 kg)  04/24/22 139 lb 9.6 oz (63.3 kg)    There is no height or weight on file to calculate BMI.  Performance status (ECOG): 1 - Symptomatic but completely ambulatory  PHYSICAL EXAM:   Physical Exam Vitals and nursing note reviewed. Exam conducted with a chaperone present.  Constitutional:      Appearance: Normal appearance.  Cardiovascular:     Rate and Rhythm: Normal rate and regular rhythm.     Pulses: Normal pulses.     Heart sounds: Normal heart sounds.  Pulmonary:  Effort: Pulmonary effort is normal.     Breath sounds: Normal breath sounds.  Abdominal:     Palpations: Abdomen is soft. There is no hepatomegaly, splenomegaly or mass.     Tenderness: There is no abdominal tenderness.  Musculoskeletal:     Right lower leg: No edema.     Left lower leg: No edema.  Lymphadenopathy:     Cervical: No cervical adenopathy.      Right cervical: No superficial, deep or posterior cervical adenopathy.    Left cervical: No superficial, deep or posterior cervical adenopathy.     Upper Body:     Right upper body: No supraclavicular or axillary adenopathy.     Left upper body: No supraclavicular or axillary adenopathy.  Neurological:     General: No focal deficit present.     Mental Status: She is alert and oriented to person, place, and time.  Psychiatric:        Mood and Affect: Mood normal.        Behavior: Behavior normal.     LABS:      Latest Ref Rng & Units 01/22/2023   12:45 PM 07/24/2022    2:44 PM 04/10/2022    1:19 PM  CBC  WBC 4.0 - 10.5 K/uL 5.1  5.0  4.9   Hemoglobin 12.0 - 15.0 g/dL 16.1  09.6  04.5   Hematocrit 36.0 - 46.0 % 39.7  36.7  41.1   Platelets 150 - 400 K/uL 272  243  284       Latest Ref Rng & Units 01/22/2023   12:45 PM 07/24/2022    2:44 PM 04/10/2022    1:19 PM  CMP  Glucose 70 - 99 mg/dL 94  98  90   BUN 8 - 23 mg/dL 10  12  11    Creatinine 0.44 - 1.00 mg/dL 4.09  8.11  9.14   Sodium 135 - 145 mmol/L 138  137  140   Potassium 3.5 - 5.1 mmol/L 3.5  3.5  3.8   Chloride 98 - 111 mmol/L 104  103  105   CO2 22 - 32 mmol/L 25  25  28    Calcium 8.9 - 10.3 mg/dL 9.3  9.4  9.7   Total Protein 6.5 - 8.1 g/dL 7.2  7.0  7.9   Total Bilirubin 0.3 - 1.2 mg/dL 0.7  0.5  1.0   Alkaline Phos 38 - 126 U/L 60  57  76   AST 15 - 41 U/L 22  21  19    ALT 0 - 44 U/L 20  19  19       Lab Results  Component Value Date   CEA1 2.1 01/22/2023   /  CEA  Date Value Ref Range Status  01/22/2023 2.1 0.0 - 4.7 ng/mL Final    Comment:    (NOTE)                             Nonsmokers          <3.9                             Smokers             <5.6 Roche [employer] Electrochemiluminescence Immunoassay (ECLIA) Values obtained with different assay methods or kits cannot be used interchangeably.  Results cannot be interpreted as absolute evidence of the presence or  absence of malignant  disease. Performed At: [facility] [address] [place], [place] [id number] Jolene Schimke MD NW:[id number]    No results found for: "PSA1" No results found for: "VHQ469" No results found for: "CAN125"  Lab Results  Component Value Date   TOTALPROTELP 6.7 05/01/2020   ALBUMINELP 3.8 05/01/2020   A1GS 0.3 05/01/2020   A2GS 0.9 05/01/2020   BETS 1.1 05/01/2020   GAMS 0.7 05/01/2020   MSPIKE Not Observed 05/01/2020   SPEI Comment 05/01/2020   Lab Results  Component Value Date   TIBC 447 11/14/2020   TIBC 415 08/16/2020   TIBC 464 (H) 05/01/2020   FERRITIN 19 11/14/2020   FERRITIN 14 08/16/2020   FERRITIN 24 05/01/2020   IRONPCTSAT 12 11/14/2020   IRONPCTSAT 20 08/16/2020   IRONPCTSAT 4 (L) 05/01/2020   No results found for: "LDH"   STUDIES:   CT CHEST ABDOMEN PELVIS W CONTRAST  Result Date: 01/22/2023 CLINICAL DATA:  History of colon cancer stage II/III, monitor. * Tracking Code: BO * EXAM: CT CHEST, ABDOMEN, AND PELVIS WITH CONTRAST TECHNIQUE: Multidetector CT imaging of the chest, abdomen and pelvis was performed following the standard protocol during bolus administration of intravenous contrast. RADIATION DOSE REDUCTION: This exam was performed according to the departmental dose-optimization program which includes automated exposure control, adjustment of the mA and/or kV according to patient size and/or use of iterative reconstruction technique. CONTRAST:  OMNIPAQUE IOHEXOL 300 MG/ML  SOLN COMPARISON:  Multiple priors including most recent CT July 24, 2022 and April 10, 2022. FINDINGS: CT CHEST FINDINGS Cardiovascular: Aortic atherosclerosis. Normal caliber thoracic aorta and central pulmonary arteries. Normal size heart. No significant pericardial effusion/thickening. Mediastinum/Nodes: No suspicious thyroid nodule. Stable prominent mediastinal lymph nodes. No pathologically enlarged mediastinal, hilar or axillary lymph nodes. The esophagus is grossly  unremarkable. Lungs/Pleura: Postsurgical changes of prior subtotal left lower lobe wedge resection with associated chronic volume loss. Slight decrease in size of the intraparenchymal fluid collection within the left lower lobe which is inseparable from the adjacent complex left pleural effusion/thickening. Stable 3 mm right lower lobe pulmonary nodule on image 70/3. No new suspicious pulmonary nodules or masses. Musculoskeletal: No chest wall mass or suspicious bone lesions identified. Stable chronic left-sided thoracotomy changes. CT ABDOMEN PELVIS FINDINGS Hepatobiliary: Stable cyst in the left lobe of the liver. Gallbladder is unremarkable. No biliary ductal dilation. Pancreas: No pancreatic ductal dilation or evidence of acute inflammation. Stable 3 mm hypodensity in the pancreatic head likely reflecting a side branch IPMN. Spleen: No splenomegaly or focal splenic lesion. Adrenals/Urinary Tract: Stable 8 mm left adrenal nodule on image 55/2 present dating back to at least PET-CT March 27, 2021, with FDG activity similar to that of background liver compatible with a benign adrenal adenoma and considered benign requiring no independent imaging follow-up. Right adrenal gland appears normal. No hydronephrosis. Stable benign right upper pole renal cyst and bilateral renal lesions technically too small to accurately characterize but statistically likely to reflect cysts and considered benign requiring no independent imaging follow-up. Urinary bladder is unremarkable for degree of distension. Stomach/Bowel: Radiopaque enteric contrast material traverses the hepatic flexure. Stomach is unremarkable for degree of distension. No pathologic dilation of small or large bowel. Prior right hemicolectomy with ileocolic anastomosis in the right hemiabdomen, no suspicious enhancing nodularity along the suture line. Left-sided colonic diverticulosis without findings of acute diverticulitis. Vascular/Lymphatic: Aortic  atherosclerosis. Smooth IVC contours. No pathologically enlarged abdominal or pelvic lymph nodes. Reproductive: Uterus and bilateral adnexa are unremarkable.  Other: No significant abdominopelvic free fluid. Musculoskeletal: No aggressive lytic or blastic lesion of bone. Multilevel degenerative changes spine. IMPRESSION: 1. Postsurgical changes of prior subtotal left lower lobe wedge resection with associated chronic volume loss. Slight decrease in size of the intraparenchymal fluid collection within the left lower lobe which is inseparable from the adjacent complex left pleural effusion/thickening. 2. Stable 3 mm right lower lobe pulmonary nodule. No new suspicious pulmonary nodules or masses. 3. Prior right hemicolectomy with ileocolic anastomosis in the right hemiabdomen, no suspicious enhancing nodularity along the suture line. 4. No evidence of metastatic disease within the abdomen or pelvis. 5. Stable 3 mm hypodensity in the pancreatic head likely reflecting a side branch IPMN. Recommend attention on follow-up. 6.  Aortic Atherosclerosis (ICD10-I70.0). Electronically Signed   By: Maudry Mayhew M.D.   On: 01/22/2023 17:07

## 2023-01-29 NOTE — Patient Instructions (Signed)
[facility]  Discharge Instructions  You were seen and examined today by Dr. Ellin Saba.  Dr. Ellin Saba discussed your most recent lab work and CT scan which revealed that everything looks good and stable.  Dr. Ellin Saba will repeat CT scan of your Chest before your next appointment.  Follow-up as scheduled in 6 months.    Thank you for choosing  [facility] to provide your oncology and hematology care.   To afford each patient quality time with our provider, please arrive at least 15 minutes before your scheduled appointment time. You may need to reschedule your appointment if you arrive late (10 or more minutes). Arriving late affects you and other patients whose appointments are after yours.  Also, if you miss three or more appointments without notifying the office, you may be dismissed from the clinic at the provider's discretion.    Again, thank you for choosing [facility].  Our hope is that these requests will decrease the amount of time that you wait before being seen by our physicians.   If you have a lab appointment with the Cancer Center - please note that after April 8th, all labs will be drawn in the cancer center.  You do not have to check in or register with the main entrance as you have in the past but will complete your check-in at the cancer center.            _____________________________________________________________  Should you have questions after your visit to [facility], please contact our office at [phone] and follow the prompts.  Our office hours are 8:00 a.m. to 4:30 p.m. Monday - Thursday and 8:00 a.m. to 2:30 p.m. Friday.  Please note that voicemails left after 4:00 p.m. may not be returned until the following business day.  We are closed weekends and all major holidays.  You do have access to a nurse 24-7, just call the main number to the clinic [phone] and do not  press any options, hold on the line and a nurse will answer the phone.    For prescription refill requests, have your pharmacy contact our office and allow 72 hours.    Masks are no longer required in the cancer centers. If you would like for your care team to wear a mask while they are taking care of you, please let them know. You may have one support person who is at least 70 years old accompany you for your appointments.

## 2023-04-08 DIAGNOSIS — M1990 Unspecified osteoarthritis, unspecified site: Secondary | ICD-10-CM | POA: Diagnosis not present

## 2023-04-08 DIAGNOSIS — J439 Emphysema, unspecified: Secondary | ICD-10-CM | POA: Diagnosis not present

## 2023-04-08 DIAGNOSIS — I700 Atherosclerosis of aorta: Secondary | ICD-10-CM | POA: Diagnosis not present

## 2023-04-08 DIAGNOSIS — E785 Hyperlipidemia, unspecified: Secondary | ICD-10-CM | POA: Diagnosis not present

## 2023-04-08 DIAGNOSIS — I739 Peripheral vascular disease, unspecified: Secondary | ICD-10-CM | POA: Diagnosis not present

## 2023-04-08 DIAGNOSIS — Z9181 History of falling: Secondary | ICD-10-CM | POA: Diagnosis not present

## 2023-04-08 DIAGNOSIS — Z87891 Personal history of nicotine dependence: Secondary | ICD-10-CM | POA: Diagnosis not present

## 2023-04-08 DIAGNOSIS — Z85038 Personal history of other malignant neoplasm of large intestine: Secondary | ICD-10-CM | POA: Diagnosis not present

## 2023-04-08 DIAGNOSIS — K219 Gastro-esophageal reflux disease without esophagitis: Secondary | ICD-10-CM | POA: Diagnosis not present

## 2023-04-08 DIAGNOSIS — I2510 Atherosclerotic heart disease of native coronary artery without angina pectoris: Secondary | ICD-10-CM | POA: Diagnosis not present

## 2023-04-08 DIAGNOSIS — Z85118 Personal history of other malignant neoplasm of bronchus and lung: Secondary | ICD-10-CM | POA: Diagnosis not present

## 2023-04-08 DIAGNOSIS — Z7983 Long term (current) use of bisphosphonates: Secondary | ICD-10-CM | POA: Diagnosis not present

## 2023-05-12 DIAGNOSIS — L814 Other melanin hyperpigmentation: Secondary | ICD-10-CM | POA: Diagnosis not present

## 2023-05-12 DIAGNOSIS — L570 Actinic keratosis: Secondary | ICD-10-CM | POA: Diagnosis not present

## 2023-05-12 DIAGNOSIS — Z08 Encounter for follow-up examination after completed treatment for malignant neoplasm: Secondary | ICD-10-CM | POA: Diagnosis not present

## 2023-05-12 DIAGNOSIS — L821 Other seborrheic keratosis: Secondary | ICD-10-CM | POA: Diagnosis not present

## 2023-05-12 DIAGNOSIS — D225 Melanocytic nevi of trunk: Secondary | ICD-10-CM | POA: Diagnosis not present

## 2023-05-12 DIAGNOSIS — Z85828 Personal history of other malignant neoplasm of skin: Secondary | ICD-10-CM | POA: Diagnosis not present

## 2023-07-21 DIAGNOSIS — I700 Atherosclerosis of aorta: Secondary | ICD-10-CM | POA: Diagnosis not present

## 2023-07-21 DIAGNOSIS — Z85038 Personal history of other malignant neoplasm of large intestine: Secondary | ICD-10-CM | POA: Diagnosis not present

## 2023-07-21 DIAGNOSIS — D649 Anemia, unspecified: Secondary | ICD-10-CM | POA: Diagnosis not present

## 2023-07-21 DIAGNOSIS — E782 Mixed hyperlipidemia: Secondary | ICD-10-CM | POA: Diagnosis not present

## 2023-07-21 DIAGNOSIS — G459 Transient cerebral ischemic attack, unspecified: Secondary | ICD-10-CM | POA: Diagnosis not present

## 2023-07-21 DIAGNOSIS — M818 Other osteoporosis without current pathological fracture: Secondary | ICD-10-CM | POA: Diagnosis not present

## 2023-07-31 ENCOUNTER — Ambulatory Visit (HOSPITAL_COMMUNITY)
Admission: RE | Admit: 2023-07-31 | Discharge: 2023-07-31 | Disposition: A | Discharge status: Home / Self Care | Payer: Medicare HMO | Source: Ambulatory Visit | Attending: Hematology | Admitting: Hematology

## 2023-07-31 ENCOUNTER — Inpatient Hospital Stay: Payer: Medicare HMO | Attending: Hematology

## 2023-07-31 DIAGNOSIS — Z85038 Personal history of other malignant neoplasm of large intestine: Secondary | ICD-10-CM | POA: Insufficient documentation

## 2023-07-31 DIAGNOSIS — J479 Bronchiectasis, uncomplicated: Secondary | ICD-10-CM | POA: Diagnosis not present

## 2023-07-31 DIAGNOSIS — C3490 Malignant neoplasm of unspecified part of unspecified bronchus or lung: Secondary | ICD-10-CM

## 2023-07-31 DIAGNOSIS — I700 Atherosclerosis of aorta: Secondary | ICD-10-CM | POA: Diagnosis not present

## 2023-07-31 LAB — CBC WITH DIFFERENTIAL/PLATELET
Abs Immature Granulocytes: 0.01 10*3/uL (ref 0.00–0.07)
Basophils Absolute: 0.1 10*3/uL (ref 0.0–0.1)
Basophils Relative: 3 %
Eosinophils Absolute: 0.2 10*3/uL (ref 0.0–0.5)
Eosinophils Relative: 5 %
HCT: 42 % (ref 36.0–46.0)
Hemoglobin: 13.8 g/dL (ref 12.0–15.0)
Immature Granulocytes: 0 %
Lymphocytes Relative: 39 %
Lymphs Abs: 1.7 10*3/uL (ref 0.7–4.0)
MCH: 30.8 pg (ref 26.0–34.0)
MCHC: 32.9 g/dL (ref 30.0–36.0)
MCV: 93.8 fL (ref 80.0–100.0)
Monocytes Absolute: 0.3 10*3/uL (ref 0.1–1.0)
Monocytes Relative: 6 %
Neutro Abs: 2.1 10*3/uL (ref 1.7–7.7)
Neutrophils Relative %: 47 %
Platelets: 250 10*3/uL (ref 150–400)
RBC: 4.48 MIL/uL (ref 3.87–5.11)
RDW: 12.1 % (ref 11.5–15.5)
WBC: 4.3 10*3/uL (ref 4.0–10.5)
nRBC: 0 % (ref 0.0–0.2)

## 2023-07-31 LAB — COMPREHENSIVE METABOLIC PANEL
ALT: 16 U/L (ref 0–44)
AST: 15 U/L (ref 15–41)
Albumin: 4.1 g/dL (ref 3.5–5.0)
Alkaline Phosphatase: 63 U/L (ref 38–126)
Anion gap: 9 (ref 5–15)
BUN: 11 mg/dL (ref 8–23)
CO2: 27 mmol/L (ref 22–32)
Calcium: 9.5 mg/dL (ref 8.9–10.3)
Chloride: 103 mmol/L (ref 98–111)
Creatinine, Ser: 0.72 mg/dL (ref 0.44–1.00)
GFR, Estimated: >60 mL/min (ref >60)
Glucose, Bld: 91 mg/dL (ref 70–99)
Potassium: 3.8 mmol/L (ref 3.5–5.1)
Sodium: 139 mmol/L (ref 135–145)
Total Bilirubin: 0.7 mg/dL (ref <1.2)
Total Protein: 7 g/dL (ref 6.5–8.1)

## 2023-08-01 LAB — CEA: CEA: 2 ng/mL (ref 0.0–4.7)

## 2023-08-06 ENCOUNTER — Inpatient Hospital Stay: Payer: Medicare HMO | Admitting: Hematology

## 2023-08-06 VITALS — BP 140/69 | HR 67 | Temp 97.6°F | Resp 18 | Ht 62.5 in | Wt 137.0 lb

## 2023-08-06 DIAGNOSIS — C189 Malignant neoplasm of colon, unspecified: Secondary | ICD-10-CM

## 2023-08-06 DIAGNOSIS — C3490 Malignant neoplasm of unspecified part of unspecified bronchus or lung: Secondary | ICD-10-CM

## 2023-08-06 DIAGNOSIS — Z85038 Personal history of other malignant neoplasm of large intestine: Secondary | ICD-10-CM | POA: Diagnosis not present

## 2023-08-06 DIAGNOSIS — C3412 Malignant neoplasm of upper lobe, left bronchus or lung: Secondary | ICD-10-CM

## 2023-08-06 NOTE — Progress Notes (Signed)
[facility] [address], [place] [address]    Clinic Day:  08/06/2023  Referring physician: Ollen Bowl, MD  Patient Care Team: Ollen Bowl, MD as PCP - General (Internal Medicine) Therese Sarah, RN as Oncology Nurse Navigator (Oncology)   ASSESSMENT & PLAN:   Assessment: 1.  Stage II (T3N0) right colon adenocarcinoma: -Colonoscopy on 03/02/2020 biopsy of the ascending colon mass consistent with adenocarcinoma.  Sessile serrated polyp in the hepatic flexure, tubular adenoma in the hepatic, transverse and proximal colon.  Tubular adenoma in the rectum. -CTAP on 03/14/2020 shows 4.6 cm intraluminal mass in the right colon consistent with known colon cancer.  No evidence of metastatic disease in the abdomen or pelvis. -Preoperative CEA on 04/06/2020 was 3.6. -Right hemicolectomy on 04/09/2020 with pathology showing 3.5 cm moderately differentiated adenocarcinoma, separate focus of invasive adenocarcinoma 0.5 cm arising in a tubular adenoma with high-grade dysplasia, 0/32 lymph nodes involved, no perforation, no lymphovascular/perineural invasion, margins negative, PT3PN0. -MMR shows loss of nuclear expression of MLH1 and PMS2.  MSI was high. -BRAF V600 and MLH1 hypermethylation positive.  Indicates sporadic nature. -CT chest with contrast on 05/15/2020 with no metastatic disease.  3 mm pulmonary nodule of the dependent right lower lobe and 7 mm groundglass opacity of the dependent superior segment of the left lower lobe.   2.  Social/family history: -She is a retired Arboriculturist. -Current active smoker, 1 pack/day for 50 years. -One brother had prostate cancer.  Another brother had multiple different type of cancers, known to the patient.  Paternal grandfather had throat cancer and a maternal aunt had stomach cancer.   3.  Stage I (T1AN0) adenocarcinoma of the left lower lobe of the lung: - Pathology on 04/05/2021 showed invasive adenocarcinoma, 0.7  cm, margins negative.  Lymph nodes from station 7, 9, 10, 4L, 8, 12, 5, 11, 13 were negative. - Given the size of the tumor, no indication for adjuvant therapy.    Plan: 1.  Stage II (T3N0) right colon adenocarcinoma: - She does not report any change in bowel habits, bleeding per rectum or melena. - CTAP on 01/22/2023 with no evidence of metastatic disease. - Labs from 07/31/2023: Normal LFTs and CEA 2.0. - Will repeat CTAP in 6 months.   2.  Stage I (T1AN0) adenocarcinoma of the left lower lobe of the lung: - She still vapes and has dry cough. - Reviewed CT chest without contrast from 07/31/2023: No significant changes.  Radiology report is pending. - Will plan to repeat CT chest in 6 months.    Orders Placed This Encounter  Procedures   CT CHEST ABDOMEN PELVIS W CONTRAST    Standing Status:   Future    Expected Date:   02/04/2024    Expiration Date:   08/05/2024    If indicated for the ordered procedure, I authorize the administration of contrast media per Radiology protocol:   Yes    Does the patient have a contrast media/X-ray dye allergy?:   No    Preferred imaging location?:   [facility]    If indicated for the ordered procedure, I authorize the administration of oral contrast media per Radiology protocol:   Yes      I,Helena R Teague,acting as a scribe for Doreatha Massed, MD.,have documented all relevant documentation on the behalf of Doreatha Massed, MD,as directed by  Doreatha Massed, MD while in the presence of Doreatha Massed, MD.  I, Doreatha Massed MD,  have reviewed the above documentation for accuracy and completeness, and I agree with the above.    Doreatha Massed, MD   12/19/20244:55 PM  CHIEF COMPLAINT:   Diagnosis: colon cancer    Cancer Staging  No matching staging information was found for the patient.    Prior Therapy: Right hemicolectomy on 04/09/2020   Current Therapy:  Surveillance    HISTORY OF PRESENT  ILLNESS:   Oncology History  Cancer of ascending colon (HCC)  05/10/2020 Genetic Testing   BRAF Mutation Analysis:     05/11/2020 Genetic Testing   MLH1 Promoter Methylation Analysis:        INTERVAL HISTORY:   Sara Coffey is a 72 y.o. female presenting to clinic today for follow up of colon cancer. She was last seen by me on 01/29/23.  Since her last visit, she underwent surveillance CT chest on 07/31/23.  Today, she states that she is doing well overall. Her appetite level is at 100%. Her energy level is at 85%. She is accompanied by a family member. Her daughter states she has a persistent dry cough since surgery, though she does still vape. She does not have follow-up with her surgeon or pulmonology. She does not use an inhaler. She denies any changes in health or BM's, and infections in the last 6 months. She will have a colonoscopy scheduled for May 2025.   PAST MEDICAL HISTORY:   Past Medical History: Past Medical History:  Diagnosis Date   Amnesia 04/17/2019   Hemorrhoid 09/28/2013   Hyperlipidemia    Osteoporosis    Syncope and collapse    TGA (transient global amnesia) 04/18/2019   TIA (transient ischemic attack) 04/17/2019   Tobacco abuse     Surgical History: Past Surgical History:  Procedure Laterality Date   BIOPSY  03/02/2020   Procedure: BIOPSY;  Surgeon: Malissa Hippo, MD;  [facility];  Service: Endoscopy;;  ascending colon mass   COLON SURGERY     COLONOSCOPY WITH PROPOFOL N/A 03/02/2020   Procedure: COLONOSCOPY WITH PROPOFOL;  Surgeon: Malissa Hippo, MD;  Location: [facility];  Service: Endoscopy;  Laterality: N/A;  1055   COLONOSCOPY WITH PROPOFOL N/A 01/09/2021   Procedure: COLONOSCOPY WITH PROPOFOL;  Surgeon: Malissa Hippo, MD;  [facility]: [facility];  Service: Endoscopy;  Laterality: N/A;  945   HEMORRHOID SURGERY  10/08/2022   INTERCOSTAL NERVE BLOCK  04/05/2021   Procedure: INTERCOSTAL NERVE BLOCK;  Surgeon: Loreli Slot, MD;  Location: [facility] OR;  Service: Thoracic;;   KNEE SX     LAPAROSCOPIC PARTIAL COLECTOMY N/A 04/09/2020   Procedure: LAPAROSCOPIC PARTIAL COLECTOMY;  Surgeon: Lucretia Roers, MD;  Location: [facility];  Service: General;  Laterality: N/A;   left eye surgery      LYMPH NODE DISSECTION Left 04/05/2021   Procedure: LYMPH NODE DISSECTION;  Surgeon: Loreli Slot, MD;  Location: [facility] OR;  Service: Thoracic;  Laterality: Left;   POLYPECTOMY  03/02/2020   Procedure: POLYPECTOMY;  Surgeon: Malissa Hippo, MD;  [facility];  Service: Endoscopy;;   POLYPECTOMY  01/09/2021   Procedure: POLYPECTOMY;  Surgeon: Malissa Hippo, MD;  [facility];  Service: Endoscopy;;   THORACOTOMY  04/05/2021   Procedure: THORACOTOMY MAJOR;  Surgeon: Loreli Slot, MD;  Location: [facility] OR;  Service: Thoracic;;   TUBAL LIGATION     VIDEO ASSISTED THORACOSCOPY Left 04/10/2021   Procedure: VIDEO ASSISTED THORACOSCOPY, EVACUATION OF HEMOTHORAX, LEFT LOWER LOBE  WEDGE RESECTION;  Surgeon: Loreli Slot, MD;  Location: [facility] OR;  Service: Thoracic;  Laterality: Left;   XI ROBOTIC ASSISTED THORACOSCOPY- SEGMENTECTOMY Left 04/05/2021   Procedure: XI ROBOTIC ASSISTED THORACOSCOPY-LEFT LOWER LOBE SUPERIOR SEGMENTECTOMY;  Surgeon: Loreli Slot, MD;  Location: [facility] OR;  Service: Thoracic;  Laterality: Left;    Social History: Social History   Socioeconomic History   Marital status: Widowed    Spouse name: Not on file   Number of children: 3   Years of education: Not on file   Highest education level: Not on file  Occupational History   Occupation: retired  Tobacco Use   Smoking status: Former    Current packs/day: 0.00    Average packs/day: 1 pack/day for 40.0 years (40.0 ttl pk-yrs)    Types: Cigarettes    Start date: 03/28/1981    Quit date: 03/28/2021    Years since quitting: 2.3   Smokeless tobacco: Never  Vaping Use   Vaping status: Some Days  Substance and Sexual  Activity   Alcohol use: No   Drug use: No   Sexual activity: Not Currently    Birth control/protection: Surgical, Post-menopausal    Comment: tubal  Other Topics Concern   Not on file  Social History Narrative   Not on file   Social Drivers of Health   Financial Resource Strain: Low Risk  (12/22/2022)   Overall Financial Resource Strain (CARDIA)    Difficulty of Paying Living Expenses: Not very hard  Food Insecurity: No Food Insecurity (12/22/2022)   Hunger Vital Sign    Worried About Running Out of Food in the Last Year: Never true    Ran Out of Food in the Last Year: Never true  Transportation Needs: No Transportation Needs (12/22/2022)   PRAPARE - Administrator, Civil Service (Medical): No    Lack of Transportation (Non-Medical): No  Physical Activity: Insufficiently Active (12/22/2022)   Exercise Vital Sign    Days of Exercise per Week: 5 days    Minutes of Exercise per Session: 20 min  Stress: No Stress Concern Present (12/22/2022)   [employer] of Occupational Health - Occupational Stress Questionnaire    Feeling of Stress : Only a little  Social Connections: Moderately Isolated (12/22/2022)   Social Connection and Isolation Panel [NHANES]    Frequency of Communication with Friends and Family: More than three times a week    Frequency of Social Gatherings with Friends and Family: Once a week    Attends Religious Services: Never    Database administrator or Organizations: No    Attends Banker Meetings: Never    Marital Status: Married  Catering manager Violence: Not At Risk (12/22/2022)   Humiliation, Afraid, Rape, and Kick questionnaire    Fear of Current or Ex-Partner: No    Emotionally Abused: No    Physically Abused: No    Sexually Abused: No    Family History: Family History  Problem Relation Age of Onset   Heart disease Father    Hypertension Father    Diabetes Father    Cancer Brother    Healthy Son    Healthy Son    Healthy  Daughter    Ulcers Mother    Kidney failure Maternal Grandfather    Throat cancer Paternal Grandfather    Cancer Brother    Breast cancer Neg Hx     Current Medications:  Current Outpatient Medications:    acetaminophen (TYLENOL) 500 MG  tablet, Take 2 tablets (1,000 mg total) by mouth every 6 (six) hours as needed for mild pain, fever or headache., Disp: 30 tablet, Rfl: 0   alendronate (FOSAMAX) 70 MG tablet, Take 70 mg by mouth once a week., Disp: , Rfl:    calcium carbonate (TUMS - DOSED IN MG ELEMENTAL CALCIUM) 500 MG chewable tablet, Chew 1 tablet by mouth as needed for indigestion or heartburn., Disp: , Rfl:    Cyanocobalamin (VITAMIN B 12) 500 MCG TABS, 1 tablet, Disp: , Rfl:    Ferrous Sulfate (IRON) 325 (65 Fe) MG TABS, Take 325 mg by mouth every other day. In the morning, Disp: , Rfl:    Melatonin 10 MG TABS, Take 10 mg by mouth at bedtime as needed (sleep)., Disp: , Rfl:    Multiple Vitamins-Minerals (MULTIVITAMIN WITH MINERALS) tablet, Take 1 tablet by mouth daily. Multivitamin for Women 50+, Disp: , Rfl:    Omega-3 Fatty Acids (FISH OIL) 1000 MG CAPS, Take 2,000 mg by mouth in the morning., Disp: , Rfl:    Oyster Shell Calcium 500 MG TABS, 1 tablet with meals, Disp: , Rfl:    rosuvastatin (CRESTOR) 5 MG tablet, Take 1 tablet (5 mg total) by mouth daily., Disp: 90 tablet, Rfl: 1   Allergies: No Known Allergies  REVIEW OF SYSTEMS:   Review of Systems  Constitutional:  Negative for chills, fatigue and fever.  HENT:   Negative for lump/mass, mouth sores, nosebleeds, sore throat and trouble swallowing.   Eyes:  Negative for eye problems.  Respiratory:  Positive for cough. Negative for shortness of breath.   Cardiovascular:  Negative for chest pain, leg swelling and palpitations.  Gastrointestinal:  Negative for abdominal pain, constipation, diarrhea, nausea and vomiting.  Genitourinary:  Negative for bladder incontinence, difficulty urinating, dysuria, frequency, hematuria  and nocturia.   Musculoskeletal:  Negative for arthralgias, back pain, flank pain, myalgias and neck pain.  Skin:  Negative for itching and rash.  Neurological:  Negative for dizziness, headaches and numbness.  Hematological:  Does not bruise/bleed easily.  Psychiatric/Behavioral:  Positive for depression. Negative for sleep disturbance and suicidal ideas. The patient is nervous/anxious.   All other systems reviewed and are negative.    VITALS:   Blood pressure (!) 140/69, pulse 67, temperature 97.6 F (36.4 C), temperature source Tympanic, resp. rate 18, height 5' 2.5" (1.588 m), weight 137 lb (62.1 kg), SpO2 100%.  Wt Readings from Last 3 Encounters:  08/06/23 137 lb (62.1 kg)  12/22/22 136 lb 8 oz (61.9 kg)  07/31/22 138 lb (62.6 kg)    Body mass index is 24.66 kg/m.  Performance status (ECOG): 1 - Symptomatic but completely ambulatory  PHYSICAL EXAM:   Physical Exam Vitals and nursing note reviewed. Exam conducted with a chaperone present.  Constitutional:      Appearance: Normal appearance.  Cardiovascular:     Rate and Rhythm: Normal rate and regular rhythm.     Pulses: Normal pulses.     Heart sounds: Normal heart sounds.  Pulmonary:     Effort: Pulmonary effort is normal.     Breath sounds: Normal breath sounds.  Abdominal:     Palpations: Abdomen is soft. There is no hepatomegaly, splenomegaly or mass.     Tenderness: There is no abdominal tenderness.  Musculoskeletal:     Right lower leg: No edema.     Left lower leg: No edema.  Lymphadenopathy:     Cervical: No cervical adenopathy.     Right cervical:  No superficial, deep or posterior cervical adenopathy.    Left cervical: No superficial, deep or posterior cervical adenopathy.     Upper Body:     Right upper body: No supraclavicular or axillary adenopathy.     Left upper body: No supraclavicular or axillary adenopathy.  Neurological:     General: No focal deficit present.     Mental Status: She is alert  and oriented to person, place, and time.  Psychiatric:        Mood and Affect: Mood normal.        Behavior: Behavior normal.     LABS:      Latest Ref Rng & Units 07/31/2023    9:56 AM 01/22/2023   12:45 PM 07/24/2022    2:44 PM  CBC  WBC 4.0 - 10.5 K/uL 4.3  5.1  5.0   Hemoglobin 12.0 - 15.0 g/dL 29.5  62.1  30.8   Hematocrit 36.0 - 46.0 % 42.0  39.7  36.7   Platelets 150 - 400 K/uL 250  272  243       Latest Ref Rng & Units 07/31/2023    9:56 AM 01/22/2023   12:45 PM 07/24/2022    2:44 PM  CMP  Glucose 70 - 99 mg/dL 91  94  98   BUN 8 - 23 mg/dL 11  10  12    Creatinine 0.44 - 1.00 mg/dL 6.57  8.46  9.62   Sodium 135 - 145 mmol/L 139  138  137   Potassium 3.5 - 5.1 mmol/L 3.8  3.5  3.5   Chloride 98 - 111 mmol/L 103  104  103   CO2 22 - 32 mmol/L 27  25  25    Calcium 8.9 - 10.3 mg/dL 9.5  9.3  9.4   Total Protein 6.5 - 8.1 g/dL 7.0  7.2  7.0   Total Bilirubin <1.2 mg/dL 0.7  0.7  0.5   Alkaline Phos 38 - 126 U/L 63  60  57   AST 15 - 41 U/L 15  22  21    ALT 0 - 44 U/L 16  20  19       Lab Results  Component Value Date   CEA1 2.0 07/31/2023   /  CEA  Date Value Ref Range Status  07/31/2023 2.0 0.0 - 4.7 ng/mL Final    Comment:    (NOTE)                             Nonsmokers          <3.9                             Smokers             <5.6 Roche [employer]ostics Electrochemiluminescence Immunoassay (ECLIA) Values obtained with different assay methods or kits cannot be used interchangeably.  Results cannot be interpreted as absolute evidence of the presence or absence of malignant disease. Performed At: [facility] [address] [place], [place] [id number] Jolene Schimke MD MW:[id number]    No results found for: "PSA1" No results found for: "CAN199" No results found for: "CAN125"  Lab Results  Component Value Date   TOTALPROTELP 6.7 05/01/2020   ALBUMINELP 3.8 05/01/2020   A1GS 0.3 05/01/2020   A2GS 0.9 05/01/2020   BETS 1.1 05/01/2020   GAMS  0.7 05/01/2020   MSPIKE Not  Observed 05/01/2020   SPEI Comment 05/01/2020   Lab Results  Component Value Date   TIBC 447 11/14/2020   TIBC 415 08/16/2020   TIBC 464 (H) 05/01/2020   FERRITIN 19 11/14/2020   FERRITIN 14 08/16/2020   FERRITIN 24 05/01/2020   IRONPCTSAT 12 11/14/2020   IRONPCTSAT 20 08/16/2020   IRONPCTSAT 4 (L) 05/01/2020   No results found for: "LDH"   STUDIES:   No results found.

## 2023-08-06 NOTE — Patient Instructions (Addendum)
[facility] at [facility] Discharge Instructions   You were seen and examined today by Dr. Ellin Saba.  He reviewed the results of your lab work which are normal.   The results of your CT scan is pending. Dr. Kirtland Bouchard looked at the images and he did not see any changes in the images. Be on the look out on MyChart for the finalized report.   We will see you back in 6 months. We will repeat a CT scan prior to this visit.    Thank you for choosing [facility] at [facility] to provide your oncology and hematology care.  To afford each patient quality time with our provider, please arrive at least 15 minutes before your scheduled appointment time.   If you have a lab appointment with the Cancer Center please come in thru the Main Entrance and check in at the main information desk.  You need to re-schedule your appointment should you arrive 10 or more minutes late.  We strive to give you quality time with our providers, and arriving late affects you and other patients whose appointments are after yours.  Also, if you no show three or more times for appointments you may be dismissed from the clinic at the providers discretion.     Again, thank you for choosing [facility].  Our hope is that these requests will decrease the amount of time that you wait before being seen by our physicians.       _____________________________________________________________  Should you have questions after your visit to [facility], please contact our office at [phone] and follow the prompts.  Our office hours are 8:00 a.m. and 4:30 p.m. Monday - Friday.  Please note that voicemails left after 4:00 p.m. may not be returned until the following business day.  We are closed weekends and major holidays.  You do have access to a nurse 24-7, just call the main number to the clinic [phone] and do not press any options, hold on the line and a nurse will  answer the phone.    For prescription refill requests, have your pharmacy contact our office and allow 72 hours.    Due to Covid, you will need to wear a mask upon entering the hospital. If you do not have a mask, a mask will be given to you at the Main Entrance upon arrival. For doctor visits, patients may have 1 support person age 46 or older with them. For treatment visits, patients can not have anyone with them due to social distancing guidelines and our immunocompromised population.

## 2023-11-11 DIAGNOSIS — Z08 Encounter for follow-up examination after completed treatment for malignant neoplasm: Secondary | ICD-10-CM | POA: Diagnosis not present

## 2023-11-11 DIAGNOSIS — D225 Melanocytic nevi of trunk: Secondary | ICD-10-CM | POA: Diagnosis not present

## 2023-11-11 DIAGNOSIS — L814 Other melanin hyperpigmentation: Secondary | ICD-10-CM | POA: Diagnosis not present

## 2023-11-11 DIAGNOSIS — Z85828 Personal history of other malignant neoplasm of skin: Secondary | ICD-10-CM | POA: Diagnosis not present

## 2023-11-11 DIAGNOSIS — L821 Other seborrheic keratosis: Secondary | ICD-10-CM | POA: Diagnosis not present

## 2023-11-26 ENCOUNTER — Encounter (INDEPENDENT_AMBULATORY_CARE_PROVIDER_SITE_OTHER): Payer: Self-pay | Admitting: *Deleted

## 2023-12-05 DIAGNOSIS — I700 Atherosclerosis of aorta: Secondary | ICD-10-CM | POA: Diagnosis not present

## 2023-12-05 DIAGNOSIS — R55 Syncope and collapse: Secondary | ICD-10-CM | POA: Diagnosis not present

## 2023-12-06 DIAGNOSIS — R55 Syncope and collapse: Secondary | ICD-10-CM | POA: Diagnosis not present

## 2023-12-21 ENCOUNTER — Telehealth (INDEPENDENT_AMBULATORY_CARE_PROVIDER_SITE_OTHER): Payer: Self-pay | Admitting: Gastroenterology

## 2023-12-21 NOTE — Telephone Encounter (Signed)
 Who is your primary care physician: Kathrine Paris  Reasons for the colonoscopy:   Have you had a colonoscopy before?  Yes 01/09/21  Do you have family history of colon cancer? no  Previous colonoscopy with polyps removed? 01/09/21  Do you have a history colorectal cancer?   yes  Are you diabetic? If yes, Type 1 or Type 2?    no  Do you have a prosthetic or mechanical heart valve? no  Do you have a pacemaker/defibrillator?   no  Have you had endocarditis/atrial fibrillation? no  Have you had joint replacement within the last 12 months?  no  Do you tend to be constipated or have to use laxatives? no  Do you have any history of drugs or alchohol?  no  Do you use supplemental oxygen?  no  Have you had a stroke or heart attack within the last 6 months? no  Do you take weight loss medication?  no  For female patients: have you had a hysterectomy?  no                                     are you post menopausal?       no                                            do you still have your menstrual cycle? no      Do you take any blood-thinning medications such as: (aspirin , warfarin, Plavix, Aggrenox)  no  If yes we need the name, milligram, dosage and who is prescribing doctor  Current Outpatient Medications on File Prior to Visit  Medication Sig Dispense Refill   alendronate (FOSAMAX) 70 MG tablet Take 70 mg by mouth once a week.     Cyanocobalamin  (VITAMIN B 12) 500 MCG TABS 1 tablet     Ferrous Sulfate  (IRON) 325 (65 Fe) MG TABS Take 325 mg by mouth every other day. In the morning     Omega-3 Fatty Acids (FISH OIL) 1000 MG CAPS Take 2,000 mg by mouth in the morning.     rosuvastatin  (CRESTOR ) 5 MG tablet Take 1 tablet (5 mg total) by mouth daily. 90 tablet 1   acetaminophen  (TYLENOL ) 500 MG tablet Take 2 tablets (1,000 mg total) by mouth every 6 (six) hours as needed for mild pain, fever or headache. (Patient not taking: Reported on 12/21/2023) 30 tablet 0   calcium  carbonate (TUMS  - DOSED IN MG ELEMENTAL CALCIUM ) 500 MG chewable tablet Chew 1 tablet by mouth as needed for indigestion or heartburn. (Patient not taking: Reported on 12/21/2023)     Melatonin 10 MG TABS Take 10 mg by mouth at bedtime as needed (sleep). (Patient not taking: Reported on 12/21/2023)     Multiple Vitamins-Minerals (MULTIVITAMIN WITH MINERALS) tablet Take 1 tablet by mouth daily. Multivitamin for Women 50+ (Patient not taking: Reported on 12/21/2023)     Glynda Lash Calcium  500 MG TABS 1 tablet with meals (Patient not taking: Reported on 12/21/2023)     No current facility-administered medications on file prior to visit.    No Known Allergies   Pharmacy: [facility] [address]  Primary Insurance Name: Medicare/UHC  Best number where you can be reached: [phone]

## 2023-12-29 MED ORDER — PEG 3350-KCL-NA BICARB-NACL 420 G PO SOLR: 4000.0000 mL | Freq: Once | ORAL | 0 refills | Status: AC

## 2023-12-29 NOTE — Telephone Encounter (Signed)
 Questionnaire from recall, no referral needed

## 2023-12-29 NOTE — Telephone Encounter (Signed)
 Pt contacted and scheduled. Pt has Medicare insurance. Instructions mailed to pt. Prep sent to pharmacy

## 2023-12-29 NOTE — Telephone Encounter (Signed)
Ok to schedule.  Room : any   Thanks,  Vista Lawman, MD Gastroenterology and Hepatology [facility]

## 2023-12-29 NOTE — Addendum Note (Signed)
 Addended by: Desean Heemstra on: 12/29/2023 02:28 PM   Modules accepted: Orders

## 2024-01-12 DIAGNOSIS — Z85828 Personal history of other malignant neoplasm of skin: Secondary | ICD-10-CM | POA: Diagnosis not present

## 2024-01-12 DIAGNOSIS — Z85038 Personal history of other malignant neoplasm of large intestine: Secondary | ICD-10-CM | POA: Diagnosis not present

## 2024-01-12 DIAGNOSIS — I700 Atherosclerosis of aorta: Secondary | ICD-10-CM | POA: Diagnosis not present

## 2024-01-12 DIAGNOSIS — D649 Anemia, unspecified: Secondary | ICD-10-CM | POA: Diagnosis not present

## 2024-01-12 DIAGNOSIS — E782 Mixed hyperlipidemia: Secondary | ICD-10-CM | POA: Diagnosis not present

## 2024-01-12 DIAGNOSIS — G459 Transient cerebral ischemic attack, unspecified: Secondary | ICD-10-CM | POA: Diagnosis not present

## 2024-01-12 DIAGNOSIS — C3432 Malignant neoplasm of lower lobe, left bronchus or lung: Secondary | ICD-10-CM | POA: Diagnosis not present

## 2024-01-12 DIAGNOSIS — C189 Malignant neoplasm of colon, unspecified: Secondary | ICD-10-CM | POA: Diagnosis not present

## 2024-01-12 DIAGNOSIS — M818 Other osteoporosis without current pathological fracture: Secondary | ICD-10-CM | POA: Diagnosis not present

## 2024-01-12 DIAGNOSIS — K644 Residual hemorrhoidal skin tags: Secondary | ICD-10-CM | POA: Diagnosis not present

## 2024-01-12 DIAGNOSIS — Z0000 Encounter for general adult medical examination without abnormal findings: Secondary | ICD-10-CM | POA: Diagnosis not present

## 2024-01-13 ENCOUNTER — Other Ambulatory Visit (HOSPITAL_COMMUNITY): Payer: Self-pay | Admitting: Internal Medicine

## 2024-01-13 DIAGNOSIS — Z1231 Encounter for screening mammogram for malignant neoplasm of breast: Secondary | ICD-10-CM

## 2024-01-13 DIAGNOSIS — M810 Age-related osteoporosis without current pathological fracture: Secondary | ICD-10-CM

## 2024-01-14 ENCOUNTER — Ambulatory Visit (HOSPITAL_COMMUNITY)
Admission: RE | Admit: 2024-01-14 | Discharge: 2024-01-14 | Disposition: A | Discharge status: Home / Self Care | Source: Ambulatory Visit | Attending: Internal Medicine | Admitting: Internal Medicine

## 2024-01-14 ENCOUNTER — Encounter (HOSPITAL_COMMUNITY): Payer: Self-pay

## 2024-01-14 DIAGNOSIS — Z1231 Encounter for screening mammogram for malignant neoplasm of breast: Secondary | ICD-10-CM | POA: Insufficient documentation

## 2024-01-14 DIAGNOSIS — M810 Age-related osteoporosis without current pathological fracture: Secondary | ICD-10-CM | POA: Insufficient documentation

## 2024-01-14 DIAGNOSIS — Z780 Asymptomatic menopausal state: Secondary | ICD-10-CM | POA: Diagnosis not present

## 2024-01-15 ENCOUNTER — Encounter (HOSPITAL_COMMUNITY): Payer: Self-pay

## 2024-01-15 ENCOUNTER — Inpatient Hospital Stay (HOSPITAL_COMMUNITY): Admission: RE | Admit: 2024-01-15 | Source: Ambulatory Visit

## 2024-01-15 DIAGNOSIS — Z1231 Encounter for screening mammogram for malignant neoplasm of breast: Secondary | ICD-10-CM

## 2024-01-19 ENCOUNTER — Telehealth: Payer: Self-pay | Admitting: *Deleted

## 2024-01-19 NOTE — Telephone Encounter (Signed)
 LMOVM to return call to see if can move from Friday 01/22/24 to Thursday 01/21/24.

## 2024-01-22 ENCOUNTER — Other Ambulatory Visit: Payer: Self-pay

## 2024-01-22 ENCOUNTER — Encounter (HOSPITAL_COMMUNITY): Payer: Self-pay | Admitting: Gastroenterology

## 2024-01-22 ENCOUNTER — Ambulatory Visit (HOSPITAL_COMMUNITY): Admitting: Anesthesiology

## 2024-01-22 ENCOUNTER — Ambulatory Visit (HOSPITAL_COMMUNITY)
Admission: RE | Admit: 2024-01-22 | Discharge: 2024-01-22 | Disposition: A | Discharge status: Home / Self Care | Attending: Gastroenterology | Admitting: Gastroenterology

## 2024-01-22 ENCOUNTER — Encounter (HOSPITAL_COMMUNITY)
Admission: RE | Disposition: A | Discharge status: Home / Self Care | Payer: Self-pay | Source: Home / Self Care | Attending: Gastroenterology

## 2024-01-22 DIAGNOSIS — Z85038 Personal history of other malignant neoplasm of large intestine: Secondary | ICD-10-CM | POA: Insufficient documentation

## 2024-01-22 DIAGNOSIS — I10 Essential (primary) hypertension: Secondary | ICD-10-CM | POA: Diagnosis not present

## 2024-01-22 DIAGNOSIS — Z1211 Encounter for screening for malignant neoplasm of colon: Secondary | ICD-10-CM | POA: Insufficient documentation

## 2024-01-22 DIAGNOSIS — Z980 Intestinal bypass and anastomosis status: Secondary | ICD-10-CM | POA: Diagnosis not present

## 2024-01-22 DIAGNOSIS — K5730 Diverticulosis of large intestine without perforation or abscess without bleeding: Secondary | ICD-10-CM

## 2024-01-22 DIAGNOSIS — K635 Polyp of colon: Secondary | ICD-10-CM | POA: Diagnosis not present

## 2024-01-22 DIAGNOSIS — Z860101 Personal history of adenomatous and serrated colon polyps: Secondary | ICD-10-CM

## 2024-01-22 DIAGNOSIS — K621 Rectal polyp: Secondary | ICD-10-CM | POA: Insufficient documentation

## 2024-01-22 DIAGNOSIS — Z87891 Personal history of nicotine dependence: Secondary | ICD-10-CM | POA: Diagnosis not present

## 2024-01-22 DIAGNOSIS — K648 Other hemorrhoids: Secondary | ICD-10-CM | POA: Diagnosis not present

## 2024-01-22 DIAGNOSIS — D123 Benign neoplasm of transverse colon: Secondary | ICD-10-CM

## 2024-01-22 DIAGNOSIS — K633 Ulcer of intestine: Secondary | ICD-10-CM | POA: Diagnosis not present

## 2024-01-22 DIAGNOSIS — K529 Noninfective gastroenteritis and colitis, unspecified: Secondary | ICD-10-CM | POA: Diagnosis not present

## 2024-01-22 HISTORY — DX: Malignant (primary) neoplasm, unspecified: C80.1

## 2024-01-22 HISTORY — PX: COLONOSCOPY: SHX5424

## 2024-01-22 LAB — HM COLONOSCOPY

## 2024-01-22 SURGERY — COLONOSCOPY
Anesthesia: General

## 2024-01-22 MED ORDER — LACTATED RINGERS IV SOLN: INTRAVENOUS | Status: DC

## 2024-01-22 MED ORDER — PROPOFOL 10 MG/ML IV BOLUS
INTRAVENOUS | Status: DC | PRN
  Administered 2024-01-22: 125 ug/kg/min via INTRAVENOUS
  Administered 2024-01-22: 80 mg via INTRAVENOUS

## 2024-01-22 MED ORDER — LIDOCAINE 2% (20 MG/ML) 5 ML SYRINGE
INTRAMUSCULAR | Status: DC | PRN
Start: 2024-01-22 — End: 2024-01-22
  Administered 2024-01-22: 60 mg via INTRAVENOUS

## 2024-01-22 NOTE — Discharge Instructions (Signed)

## 2024-01-22 NOTE — H&P (Signed)
 Primary Care Physician:  Elester Grim, MD Primary Gastroenterologist:  Dr. Alita Irwin  Pre-Procedure History & Physical: HPI: Patient is a 73 yo Caucasian female who has a history of Colon cancer .  She underwent colonoscopy in 2021 and followed 2022 and was found to have polyps as well as adenocarcinoma of the ascending colon.  She underwent lap assisted right hemicolectomy.  She had T3 N0 MX disease.  She has remained in remission.  She has not required any adjuvant chemotherapy.  She is under care of Dr. Cheree Cords. She denies abdominal pain rectal bleeding diarrhea or weight loss. Family history is negative for colorectal carcinoma.  Past Medical History:  Diagnosis Date   Amnesia 04/17/2019   Cancer [facility])    Hemorrhoid 09/28/2013   Hyperlipidemia    Osteoporosis    Syncope and collapse    TGA (transient global amnesia) 04/18/2019   TIA (transient ischemic attack) 04/17/2019   Tobacco abuse     Past Surgical History:  Procedure Laterality Date   BIOPSY  03/02/2020   Procedure: BIOPSY;  Surgeon: Ruby Corporal, MD;  [facility]: [facility];  Service: Endoscopy;;  ascending colon mass   COLON SURGERY     COLONOSCOPY WITH PROPOFOL  N/A 03/02/2020   Procedure: COLONOSCOPY WITH PROPOFOL ;  Surgeon: Ruby Corporal, MD;  Location: [facility];  Service: Endoscopy;  Laterality: N/A;  1055   COLONOSCOPY WITH PROPOFOL  N/A 01/09/2021   Procedure: COLONOSCOPY WITH PROPOFOL ;  Surgeon: Ruby Corporal, MD;  Location: [facility];  Service: Endoscopy;  Laterality: N/A;  945   HEMORRHOID SURGERY  10/08/2022   INTERCOSTAL NERVE BLOCK  04/05/2021   Procedure: INTERCOSTAL NERVE BLOCK;  Surgeon: Zelphia Higashi, MD;  Location: [facility] OR;  Service: Thoracic;;   KNEE SX     LAPAROSCOPIC PARTIAL COLECTOMY N/A 04/09/2020   Procedure: LAPAROSCOPIC PARTIAL COLECTOMY;  Surgeon: Awilda Bogus, MD;  Location: [facility];  Service: General;  Laterality: N/A;   left eye surgery      LYMPH NODE  DISSECTION Left 04/05/2021   Procedure: LYMPH NODE DISSECTION;  Surgeon: Zelphia Higashi, MD;  Location: [facility] OR;  Service: Thoracic;  Laterality: Left;   POLYPECTOMY  03/02/2020   Procedure: POLYPECTOMY;  Surgeon: Ruby Corporal, MD;  [facility]: [facility];  Service: Endoscopy;;   POLYPECTOMY  01/09/2021   Procedure: POLYPECTOMY;  Surgeon: Ruby Corporal, MD;  [facility];  Service: Endoscopy;;   THORACOTOMY  04/05/2021   Procedure: THORACOTOMY MAJOR;  Surgeon: Zelphia Higashi, MD;  Location: [facility] OR;  Service: Thoracic;;   TUBAL LIGATION     VIDEO ASSISTED THORACOSCOPY Left 04/10/2021   Procedure: VIDEO ASSISTED THORACOSCOPY, EVACUATION OF HEMOTHORAX, LEFT LOWER LOBE WEDGE RESECTION;  Surgeon: Zelphia Higashi, MD;  Location: [facility] OR;  Service: Thoracic;  Laterality: Left;   XI ROBOTIC ASSISTED THORACOSCOPY- SEGMENTECTOMY Left 04/05/2021   Procedure: XI ROBOTIC ASSISTED THORACOSCOPY-LEFT LOWER LOBE SUPERIOR SEGMENTECTOMY;  Surgeon: Zelphia Higashi, MD;  Location: [facility] OR;  Service: Thoracic;  Laterality: Left;    Prior to Admission medications   Medication Sig Start Date End Date Taking? Authorizing Provider  alendronate (FOSAMAX) 70 MG tablet Take 70 mg by mouth once a week. 03/03/22  Yes [provider]  Cyanocobalamin  (VITAMIN B 12) 500 MCG TABS 1 tablet   Yes [provider]  escitalopram (LEXAPRO) 10 MG tablet Take 10 mg by mouth daily.   Yes [provider]  Ferrous Sulfate  (IRON) 325 (  65 Fe) MG TABS Take 325 mg by mouth every other day. In the morning   Yes [provider]  Omega-3 Fatty Acids (FISH OIL) 1000 MG CAPS Take 2,000 mg by mouth in the morning.   Yes [provider]  rosuvastatin  (CRESTOR ) 5 MG tablet Take 1 tablet (5 mg total) by mouth daily. 04/25/21  Yes Wilhemena Harbour, NP  acetaminophen  (TYLENOL ) 500 MG tablet Take 2 tablets (1,000 mg total) by mouth every 6 (six) hours as needed for mild  pain, fever or headache. Patient not taking: Reported on 12/21/2023 04/13/21   Gold, Wayne E, PA-C  calcium  carbonate (TUMS - DOSED IN MG ELEMENTAL CALCIUM ) 500 MG chewable tablet Chew 1 tablet by mouth as needed for indigestion or heartburn. Patient not taking: Reported on 12/21/2023    [provider]  Melatonin 10 MG TABS Take 10 mg by mouth at bedtime as needed (sleep). Patient not taking: Reported on 12/21/2023    [provider]  Multiple Vitamins-Minerals (MULTIVITAMIN WITH MINERALS) tablet Take 1 tablet by mouth daily. Multivitamin for Women 50+ Patient not taking: Reported on 12/21/2023    [provider]  Glynda Lash Calcium  500 MG TABS 1 tablet with meals Patient not taking: Reported on 12/21/2023    [provider]    Allergies as of 12/29/2023   (No Known Allergies)    Family History  Problem Relation Age of Onset   Heart disease Father    Hypertension Father    Diabetes Father    Cancer Brother    Healthy Son    Healthy Son    Healthy Daughter    Ulcers Mother    Kidney failure Maternal Grandfather    Throat cancer Paternal Grandfather    Cancer Brother    Breast cancer Neg Hx     Social History   Socioeconomic History   Marital status: Widowed    Spouse name: Not on file   Number of children: 3   Years of education: Not on file   Highest education level: Not on file  Occupational History   Occupation: retired  Tobacco Use   Smoking status: Former    Current packs/day: 0.00    Average packs/day: 1 pack/day for 40.0 years (40.0 ttl pk-yrs)    Types: Cigarettes    Start date: 03/28/1981    Quit date: 03/28/2021    Years since quitting: 2.8   Smokeless tobacco: Never  Vaping Use   Vaping status: Some Days  Substance and Sexual Activity   Alcohol use: No   Drug use: No   Sexual activity: Not Currently    Birth control/protection: Surgical, Post-menopausal    Comment: tubal  Other Topics Concern   Not on file  Social  History Narrative   Not on file   Social Drivers of Health   Financial Resource Strain: Low Risk  (12/22/2022)   Overall Financial Resource Strain (CARDIA)    Difficulty of Paying Living Expenses: Not very hard  Food Insecurity: No Food Insecurity (12/22/2022)   Hunger Vital Sign    Worried About Running Out of Food in the Last Year: Never true    Ran Out of Food in the Last Year: Never true  Transportation Needs: No Transportation Needs (12/22/2022)   PRAPARE - Administrator, Civil Service (Medical): No    Lack of Transportation (Non-Medical): No  Physical Activity: Insufficiently Active (12/22/2022)   Exercise Vital Sign    Days of Exercise per Week:  5 days    Minutes of Exercise per Session: 20 min  Stress: No Stress Concern Present (12/22/2022)   [employer] of Occupational Health - Occupational Stress Questionnaire    Feeling of Stress : Only a little  Social Connections: Moderately Isolated (12/22/2022)   Social Connection and Isolation Panel [NHANES]    Frequency of Communication with Friends and Family: More than three times a week    Frequency of Social Gatherings with Friends and Family: Once a week    Attends Religious Services: Never    Database administrator or Organizations: No    Attends Banker Meetings: Never    Marital Status: Married  Catering manager Violence: Not At Risk (12/22/2022)   Humiliation, Afraid, Rape, and Kick questionnaire    Fear of Current or Ex-Partner: No    Emotionally Abused: No    Physically Abused: No    Sexually Abused: No    Review of Systems: See HPI, otherwise negative ROS  Physical Exam: Vital signs in last 24 hours: Temp:  [98.1 F (36.7 C)] 98.1 F (36.7 C) (06/06 0902) Pulse Rate:  [64] 64 (06/06 0902) Resp:  [16] 16 (06/06 0902) BP: (120)/(66) 120/66 (06/06 0902) SpO2:  [100 %] 100 % (06/06 0902) Weight:  [61.2 kg] 61.2 kg (06/06 0902)   General:   Alert,  Well-developed, well-nourished, pleasant  and cooperative in NAD Head:  Normocephalic and atraumatic. Eyes:  Sclera clear, no icterus.   Conjunctiva pink. Ears:  Normal auditory acuity. Nose:  No deformity, discharge,  or lesions. Msk:  Symmetrical without gross deformities. Normal posture. Extremities:  Without clubbing or edema. Neurologic:  Alert and  oriented x4;  grossly normal neurologically. Skin:  Intact without significant lesions or rashes. Psych:  Alert and cooperative. Normal mood and affect.  Impression/Plan: Patient is a 73 yo Caucasian female who has a history of Colon cancer . Proceed with surveillance colonoscopy   The risks of the procedure including infection, bleed, or perforation as well as benefits, limitations, alternatives and imponderables have been reviewed with the patient. Questions have been answered. All parties agreeable.

## 2024-01-22 NOTE — Anesthesia Postprocedure Evaluation (Signed)
 Anesthesia Post Note  Patient: Sara Coffey  Procedure(s) Performed: COLONOSCOPY  Patient location during evaluation: Phase II Anesthesia Type: General Level of consciousness: awake Pain management: pain level controlled Vital Signs Assessment: post-procedure vital signs reviewed and stable Respiratory status: spontaneous breathing and respiratory function stable Cardiovascular status: blood pressure returned to baseline and stable Postop Assessment: no headache and no apparent nausea or vomiting Anesthetic complications: no Comments: Late entry   No notable events documented.   Last Vitals:  Vitals:   01/22/24 0902 01/22/24 1102  BP: 120/66 (!) 93/51  Pulse: 64 (!) 57  Resp: 16 15  Temp: 36.7 C 36.5 C  SpO2: 100% 97%    Last Pain:  Vitals:   01/22/24 1102  TempSrc:   PainSc: 0-No pain                 Coretha Dew

## 2024-01-22 NOTE — Transfer of Care (Addendum)
 Immediate Anesthesia Transfer of Care Note  Patient: Delila Felty  Procedure(s) Performed: COLONOSCOPY  Patient Location: Endoscopy Unit  Anesthesia Type:General  Level of Consciousness: drowsy and patient cooperative  Airway & Oxygen Therapy: Patient Spontanous Breathing  Post-op Assessment: Report given to RN and Post -op Vital signs reviewed and stable  Post vital signs: Reviewed and stable  Last Vitals:  Vitals Value Taken Time  BP 93/51 01/22/24   1102  Temp 36.5 01/22/24   1102  Pulse 56 01/22/24   1102  Resp 15 01/22/24   1102  SpO2 97% 01/22/24   1102    Last Pain:  Vitals:   01/22/24 1027  TempSrc:   PainSc: 0-No pain         Complications: No notable events documented.

## 2024-01-22 NOTE — Anesthesia Preprocedure Evaluation (Signed)
 Anesthesia Evaluation  Patient identified by MRN, date of birth, ID band Patient awake    Reviewed: Allergy & Precautions, H&P , NPO status , Patient's Chart, lab work & pertinent test results, reviewed documented beta blocker date and time   Airway Mallampati: II  TM Distance: >3 FB Neck ROM: full    Dental no notable dental hx.    Pulmonary neg pulmonary ROS, former smoker   Pulmonary exam normal breath sounds clear to auscultation       Cardiovascular Exercise Tolerance: Good hypertension, + Valvular Problems/Murmurs  Rhythm:regular Rate:Normal     Neuro/Psych TIA negative psych ROS   GI/Hepatic negative GI ROS, Neg liver ROS,,,  Endo/Other  negative endocrine ROS    Renal/GU negative Renal ROS  negative genitourinary   Musculoskeletal   Abdominal   Peds  Hematology  (+) Blood dyscrasia, anemia   Anesthesia Other Findings   Reproductive/Obstetrics negative OB ROS                             Anesthesia Physical Anesthesia Plan  ASA: 3  Anesthesia Plan: General   Post-op Pain Management:    Induction:   PONV Risk Score and Plan: Propofol  infusion  Airway Management Planned:   Additional Equipment:   Intra-op Plan:   Post-operative Plan:   Informed Consent: I have reviewed the patients History and Physical, chart, labs and discussed the procedure including the risks, benefits and alternatives for the proposed anesthesia with the patient or authorized representative who has indicated his/her understanding and acceptance.     Dental Advisory Given  Plan Discussed with: CRNA  Anesthesia Plan Comments:        Anesthesia Quick Evaluation

## 2024-01-22 NOTE — Op Note (Signed)
 [facility] Patient Name: Sara Coffey Procedure Date: 01/22/2024 10:23 AM MRN: [id number] Date of Birth: August 18, 1951 Attending MD: Terril Fetters , MD, [id number] CSN: [id number] Age: 73 Admit Type: Outpatient Procedure:                Colonoscopy Indications:              High risk colon cancer surveillance: Personal                            history of colon cancer Providers:                Terril Fetters, MD, Crystal Page, Annell Barrow Referring MD:              Medicines:                Monitored Anesthesia Care Complications:            No immediate complications. Estimated Blood Loss:     Estimated blood loss: none. Procedure:                Pre-Anesthesia Assessment:                           - Prior to the procedure, a History and Physical                            was performed, and patient medications and                            allergies were reviewed. The patient's tolerance of                            previous anesthesia was also reviewed. The risks                            and benefits of the procedure and the sedation                            options and risks were discussed with the patient.                            All questions were answered, and informed consent                            was obtained. Prior Anticoagulants: The patient has                            taken no anticoagulant or antiplatelet agents. ASA                            Grade Assessment: II - A patient with mild systemic                            disease. After reviewing the risks and benefits,  the patient was deemed in satisfactory condition to                            undergo the procedure.                           After obtaining informed consent, the colonoscope                            was passed under direct vision. Throughout the                            procedure, the patient's blood pressure, pulse, and                             oxygen saturations were monitored continuously. The                            [id number]-HQ190L ([id number]) scope was introduced through                            the anus and advanced to the the ileocolonic                            anastomosis. The colonoscopy was performed without                            difficulty. The patient tolerated the procedure                            well. The quality of the bowel preparation was                            evaluated using the BBPS [facility] Bowel Preparation                            Scale) with scores of: Right Colon = 3, Transverse                            Colon = 3 and Left Colon = 3 (entire mucosa seen                            well with no residual staining, small fragments of                            stool or opaque liquid). The total BBPS score                            equals 9. The terminal ileum, ileocecal valve,                            appendiceal orifice, and rectum were photographed. Scope In: 10:34:37 AM Scope Out: 10:56:55 AM Scope Withdrawal Time: 0 hours 15  minutes 41 seconds  Total Procedure Duration: 0 hours 22 minutes 18 seconds  Findings:      There was evidence of a prior end-to-end ileo-colonic anastomosis in the       cecum. This was patent and was characterized by ulceration. The       anastomosis was traversed. Biopsies were taken with a cold forceps for       histology.      Five sessile polyps were found in the rectum, descending colon and       transverse colon. The polyps were 4 to 7 mm in size. These polyps were       removed with a cold snare. Resection and retrieval were complete.      Scattered small-mouthed diverticula were found in the left colon.      Non-bleeding internal hemorrhoids were found during retroflexion. The       hemorrhoids were small. Impression:               - Patent end-to-end ileo-colonic anastomosis,                            characterized by ulceration. Biopsied. Likely                             anastomotic ulcer, small                           - Five 4 to 7 mm polyps in the rectum, in the                            descending colon and in the transverse colon,                            removed with a cold snare. Resected and retrieved.                           - Diverticulosis in the left colon.                           - Non-bleeding internal hemorrhoids. Moderate Sedation:      Per Anesthesia Care Recommendation:           - Patient has a contact number available for                            emergencies. The signs and symptoms of potential                            delayed complications were discussed with the                            patient. Return to normal activities tomorrow.                            Written discharge instructions were provided to the  patient.                           - Resume previous diet.                           - Continue present medications.                           - Await pathology results.                           - Repeat colonoscopy in 3 - 5 years for                            surveillance based on pathology results.                           - Return to primary care physician as previously                            scheduled. Procedure Code(s):        --- Professional ---                           [phone], Colonoscopy, flexible; with removal of                            tumor(s), polyp(s), or other lesion(s) by snare                            technique                           45380, 59, Colonoscopy, flexible; with biopsy,                            single or multiple Diagnosis Code(s):        --- Professional ---                           U04.540, Personal history of other malignant                            neoplasm of large intestine                           Z98.0, Intestinal bypass and anastomosis status                           D12.8, Benign neoplasm of rectum                            D12.4, Benign neoplasm of descending colon                           D12.3, Benign neoplasm of transverse colon (hepatic  flexure or splenic flexure)                           K64.8, Other hemorrhoids                           K57.30, Diverticulosis of large intestine without                            perforation or abscess without bleeding CPT copyright 2022 American Medical Association. All rights reserved. The codes documented in this report are preliminary and upon coder review may  be revised to meet current compliance requirements. Terril Fetters, MD Terril Fetters, MD 01/22/2024 11:04:11 AM This report has been signed electronically. Number of Addenda: 0

## 2024-01-25 ENCOUNTER — Encounter (INDEPENDENT_AMBULATORY_CARE_PROVIDER_SITE_OTHER): Payer: Self-pay | Admitting: *Deleted

## 2024-02-02 ENCOUNTER — Other Ambulatory Visit: Payer: Self-pay

## 2024-02-02 DIAGNOSIS — C3490 Malignant neoplasm of unspecified part of unspecified bronchus or lung: Secondary | ICD-10-CM

## 2024-02-03 ENCOUNTER — Inpatient Hospital Stay: Payer: Medicare HMO | Attending: Hematology

## 2024-02-03 ENCOUNTER — Ambulatory Visit (HOSPITAL_COMMUNITY)
Admission: RE | Admit: 2024-02-03 | Discharge: 2024-02-03 | Disposition: A | Discharge status: Home / Self Care | Payer: Medicare HMO | Source: Ambulatory Visit | Attending: Hematology | Admitting: Hematology

## 2024-02-03 DIAGNOSIS — C189 Malignant neoplasm of colon, unspecified: Secondary | ICD-10-CM | POA: Diagnosis not present

## 2024-02-03 DIAGNOSIS — C3490 Malignant neoplasm of unspecified part of unspecified bronchus or lung: Secondary | ICD-10-CM | POA: Insufficient documentation

## 2024-02-03 DIAGNOSIS — C3412 Malignant neoplasm of upper lobe, left bronchus or lung: Secondary | ICD-10-CM | POA: Insufficient documentation

## 2024-02-03 DIAGNOSIS — J90 Pleural effusion, not elsewhere classified: Secondary | ICD-10-CM | POA: Diagnosis not present

## 2024-02-03 DIAGNOSIS — K5730 Diverticulosis of large intestine without perforation or abscess without bleeding: Secondary | ICD-10-CM | POA: Diagnosis not present

## 2024-02-03 DIAGNOSIS — Z85118 Personal history of other malignant neoplasm of bronchus and lung: Secondary | ICD-10-CM | POA: Diagnosis not present

## 2024-02-03 LAB — CBC WITH DIFFERENTIAL/PLATELET
Abs Immature Granulocytes: 0.01 10*3/uL (ref 0.00–0.07)
Basophils Absolute: 0.1 10*3/uL (ref 0.0–0.1)
Basophils Relative: 2 %
Eosinophils Absolute: 0.3 10*3/uL (ref 0.0–0.5)
Eosinophils Relative: 5 %
HCT: 40.9 % (ref 36.0–46.0)
Hemoglobin: 13.9 g/dL (ref 12.0–15.0)
Immature Granulocytes: 0 %
Lymphocytes Relative: 38 %
Lymphs Abs: 1.9 10*3/uL (ref 0.7–4.0)
MCH: 32 pg (ref 26.0–34.0)
MCHC: 34 g/dL (ref 30.0–36.0)
MCV: 94 fL (ref 80.0–100.0)
Monocytes Absolute: 0.4 10*3/uL (ref 0.1–1.0)
Monocytes Relative: 8 %
Neutro Abs: 2.5 10*3/uL (ref 1.7–7.7)
Neutrophils Relative %: 47 %
Platelets: 281 10*3/uL (ref 150–400)
RBC: 4.35 MIL/uL (ref 3.87–5.11)
RDW: 12.3 % (ref 11.5–15.5)
WBC: 5.2 10*3/uL (ref 4.0–10.5)
nRBC: 0 % (ref 0.0–0.2)

## 2024-02-03 LAB — COMPREHENSIVE METABOLIC PANEL WITH GFR
ALT: 14 U/L (ref 0–44)
AST: 16 U/L (ref 15–41)
Albumin: 4.2 g/dL (ref 3.5–5.0)
Alkaline Phosphatase: 76 U/L (ref 38–126)
Anion gap: 10 (ref 5–15)
BUN: 11 mg/dL (ref 8–23)
CO2: 25 mmol/L (ref 22–32)
Calcium: 9.8 mg/dL (ref 8.9–10.3)
Chloride: 103 mmol/L (ref 98–111)
Creatinine, Ser: 0.82 mg/dL (ref 0.44–1.00)
GFR, Estimated: >60 mL/min (ref >60)
Glucose, Bld: 91 mg/dL (ref 70–99)
Potassium: 4.8 mmol/L (ref 3.5–5.1)
Sodium: 138 mmol/L (ref 135–145)
Total Bilirubin: 0.5 mg/dL (ref 0.0–1.2)
Total Protein: 7.3 g/dL (ref 6.5–8.1)

## 2024-02-03 MED ORDER — IOHEXOL 9 MG/ML PO SOLN
500.0000 mL | ORAL | Status: AC
  Administered 2024-02-03: 500 mL via ORAL

## 2024-02-03 MED ORDER — IOHEXOL 300 MG/ML  SOLN: 100.0000 mL | Freq: Once | INTRAMUSCULAR | Status: DC | PRN

## 2024-02-03 MED ORDER — IOHEXOL 350 MG/ML SOLN
80.0000 mL | Freq: Once | INTRAVENOUS | Status: AC | PRN
Start: 2024-02-03 — End: 2024-02-03
  Administered 2024-02-03: 80 mL via INTRAVENOUS

## 2024-02-04 LAB — CEA: CEA: 2 ng/mL (ref 0.0–4.7)

## 2024-02-08 LAB — SURGICAL PATHOLOGY

## 2024-02-09 ENCOUNTER — Ambulatory Visit (INDEPENDENT_AMBULATORY_CARE_PROVIDER_SITE_OTHER): Payer: Self-pay | Admitting: Gastroenterology

## 2024-02-09 NOTE — Progress Notes (Signed)
 5 yr TCS noted in recall Patient result letter mailed procedure note and pathology result faxed to PCP

## 2024-02-10 ENCOUNTER — Inpatient Hospital Stay (HOSPITAL_BASED_OUTPATIENT_CLINIC_OR_DEPARTMENT_OTHER): Payer: Medicare HMO | Admitting: Hematology

## 2024-02-10 VITALS — BP 128/69 | HR 73 | Temp 97.8°F | Resp 18

## 2024-02-10 DIAGNOSIS — C182 Malignant neoplasm of ascending colon: Secondary | ICD-10-CM | POA: Insufficient documentation

## 2024-02-10 DIAGNOSIS — C189 Malignant neoplasm of colon, unspecified: Secondary | ICD-10-CM | POA: Diagnosis not present

## 2024-02-10 DIAGNOSIS — Z79899 Other long term (current) drug therapy: Secondary | ICD-10-CM | POA: Insufficient documentation

## 2024-02-10 DIAGNOSIS — Z85118 Personal history of other malignant neoplasm of bronchus and lung: Secondary | ICD-10-CM | POA: Insufficient documentation

## 2024-02-10 DIAGNOSIS — C3490 Malignant neoplasm of unspecified part of unspecified bronchus or lung: Secondary | ICD-10-CM

## 2024-02-10 DIAGNOSIS — F17290 Nicotine dependence, other tobacco product, uncomplicated: Secondary | ICD-10-CM | POA: Insufficient documentation

## 2024-02-10 NOTE — Patient Instructions (Signed)
 [facility]  Discharge Instructions  You were seen and examined today by Dr. Rogers.  Dr. Rogers discussed your most recent lab work and CT scan which revealed that everything looks good and stable.  Dr. Rogers will repeat CT chest abdomen and pelvis in 1 year.  Follow-up as scheduled.    Thank you for choosing [facility] to provide your oncology and hematology care.   To afford each patient quality time with our provider, please arrive at least 15 minutes before your scheduled appointment time. You may need to reschedule your appointment if you arrive late (10 or more minutes). Arriving late affects you and other patients whose appointments are after yours.  Also, if you miss three or more appointments without notifying the office, you may be dismissed from the clinic at the provider's discretion.    Again, thank you for choosing [facility].  Our hope is that these requests will decrease the amount of time that you wait before being seen by our physicians.   If you have a lab appointment with the Cancer Center - please note that after April 8th, all labs will be drawn in the cancer center.  You do not have to check in or register with the main entrance as you have in the past but will complete your check-in at the cancer center.            _____________________________________________________________  Should you have questions after your visit to [facility], please contact our office at [phone] and follow the prompts.  Our office hours are 8:00 a.m. to 4:30 p.m. Monday - Thursday and 8:00 a.m. to 2:30 p.m. Friday.  Please note that voicemails left after 4:00 p.m. may not be returned until the following business day.  We are closed weekends and all major holidays.  You do have access to a nurse 24-7, just call the main number to the clinic [phone] and do not press any options, hold on  the line and a nurse will answer the phone.    For prescription refill requests, have your pharmacy contact our office and allow 72 hours.    Masks are no longer required in the cancer centers. If you would like for your care team to wear a mask while they are taking care of you, please let them know. You may have one support person who is at least 71 years old accompany you for your appointments.

## 2024-02-10 NOTE — Progress Notes (Signed)
 [facility] [address] [place] [address]    Clinic Day:  02/10/2024  Referring physician: Vernon Velna SAUNDERS, MD  Patient Care Team: Sara Velna SAUNDERS, MD as PCP - General (Internal Medicine) Sara Joesph SQUIBB, RN as Oncology Nurse Navigator (Oncology)   ASSESSMENT & PLAN:   Assessment: 1.  Stage II (T3N0) right colon adenocarcinoma: -Colonoscopy on 03/02/2020 biopsy of the ascending colon mass consistent with adenocarcinoma.  Sessile serrated polyp in the hepatic flexure, tubular adenoma in the hepatic, transverse and proximal colon.  Tubular adenoma in the rectum. -CTAP on 03/14/2020 shows 4.6 cm intraluminal mass in the right colon consistent with known colon cancer.  No evidence of metastatic disease in the abdomen or pelvis. -Preoperative CEA on 04/06/2020 was 3.6. -Right hemicolectomy on 04/09/2020 with pathology showing 3.5 cm moderately differentiated adenocarcinoma, separate focus of invasive adenocarcinoma 0.5 cm arising in a tubular adenoma with high-grade dysplasia, 0/32 lymph nodes involved, no perforation, no lymphovascular/perineural invasion, margins negative, PT3PN0. -MMR shows loss of nuclear expression of MLH1 and PMS2.  MSI was high. -BRAF V600 and MLH1 hypermethylation positive.  Indicates sporadic nature. -CT chest with contrast on 05/15/2020 with no metastatic disease.  3 mm pulmonary nodule of the dependent right lower lobe and 7 mm groundglass opacity of the dependent superior segment of the left lower lobe.   2.  Social/family history: -She is a retired Arboriculturist. -Current active smoker, 1 pack/day for 50 years. -One brother had prostate cancer.  Another brother had multiple different type of cancers, known to the patient.  Paternal grandfather had throat cancer and a maternal aunt had stomach cancer.   3.  Stage I (T1AN0) adenocarcinoma of the left lower lobe of the lung: - Pathology on 04/05/2021 showed invasive adenocarcinoma, 0.7  cm, margins negative.  Lymph nodes from station 7, 9, 10, 4L, 8, 12, 5, 11, 13 were negative. - Given the size of the tumor, no indication for adjuvant therapy.    Plan: 1.  Stage II (T3N0) right colon adenocarcinoma: - She does not report any change in bowel habits or bleeding per rectum or melena. - Reviewed labs from 02/03/2024: CBC, LFTs normal.  CEA was 2.0. - She had colonoscopy on 01/22/2024: Benign polyps were removed. - CT CAP from 02/03/2024: No evidence of metastatic disease. - Will do 1 more CT scan of the abdomen and pelvis in 1 year.  After that she does not require any imaging.   2.  Stage I (T1AN0) adenocarcinoma of the left lower lobe of the lung: - Denies any recent pulmonary infections. - Reviewed CT CAP from 02/03/2024: Unchanged 4 mm nodule in the peripheral right lower lobe.  No evidence of recurrence.  Will plan on repeating CT chest in 1 year.    Orders Placed This Encounter  Procedures   CT CHEST ABDOMEN PELVIS W CONTRAST    Standing Status:   Future    Expected Date:   02/09/2025    Expiration Date:   02/09/2025    If indicated for the ordered procedure, I authorize the administration of contrast media per Radiology protocol:   Yes    Does the patient have a contrast media/X-ray dye allergy?:   No    Preferred imaging location?:   [facility]    Release to patient:   Immediate    If indicated for the ordered procedure, I authorize the administration of oral contrast media per Radiology protocol:   Yes  CBC with Differential/Platelet    Standing Status:   Future    Expiration Date:   02/09/2025    Release to patient:   Immediate   Comprehensive metabolic panel with GFR    Standing Status:   Future    Expiration Date:   02/09/2025    Release to patient:   Immediate       I,Sara Coffey,acting as a scribe for Sara Stands, MD.,have documented all relevant documentation on the behalf of Sara Stands, MD,as directed by  Sara Stands, MD while in the presence of Sara Stands, MD.  I, Sara Stands MD, have reviewed the above documentation for accuracy and completeness, and I agree with the above.     Sara Stands, MD   6/25/20251:07 PM  CHIEF COMPLAINT:   Diagnosis: colon cancer    Cancer Staging  No matching staging information was found for the patient.    Prior Therapy: Right hemicolectomy on 04/09/2020   Current Therapy:  Surveillance    HISTORY OF PRESENT ILLNESS:   Oncology History  Cancer of ascending colon (HCC)  05/10/2020 Genetic Testing   BRAF Mutation Analysis:     05/11/2020 Genetic Testing   MLH1 Promoter Methylation Analysis:        INTERVAL HISTORY:   Sara Coffey is a 73 y.o. female presenting to clinic today for follow up of colon cancer. She was last seen by me on 08/06/23.  Since her last visit, she underwent a colonoscopy on 01/22/24 with Dr. Cinderella.   Sara Coffey also presented to the ED on 12/05/23 for a near syncope episode, thought to be a vasovagal syncope.   Today, she states that she is doing well overall. Her appetite level is at 100%. Her energy level is at 75%. She is accompanied by a family member.  PAST MEDICAL HISTORY:   Past Medical History: Past Medical History:  Diagnosis Date   Amnesia 04/17/2019   Cancer (HCC)    Hemorrhoid 09/28/2013   Hyperlipidemia    Osteoporosis    Syncope and collapse    TGA (transient global amnesia) 04/18/2019   TIA (transient ischemic attack) 04/17/2019   Tobacco abuse     Surgical History: Past Surgical History:  Procedure Laterality Date   BIOPSY  03/02/2020   Procedure: BIOPSY;  Surgeon: Sara Claudis PENNER, MD;  [facility];  Service: Endoscopy;;  ascending colon mass   COLON SURGERY     COLONOSCOPY N/A 01/22/2024   Procedure: COLONOSCOPY;  Surgeon: Sara Deatrice FALCON, MD;  Location: [facility];  Service: Endoscopy;  Laterality: N/A;  10:00AM;ASA 1-2   COLONOSCOPY WITH PROPOFOL  N/A  03/02/2020   Procedure: COLONOSCOPY WITH PROPOFOL ;  Surgeon: Sara Claudis PENNER, MD;  Location: [facility];  Service: Endoscopy;  Laterality: N/A;  1055   COLONOSCOPY WITH PROPOFOL  N/A 01/09/2021   Procedure: COLONOSCOPY WITH PROPOFOL ;  Surgeon: Sara Claudis PENNER, MD;  Location: [facility];  Service: Endoscopy;  Laterality: N/A;  945   HEMORRHOID SURGERY  10/08/2022   INTERCOSTAL NERVE BLOCK  04/05/2021   Procedure: INTERCOSTAL NERVE BLOCK;  Surgeon: Kerrin Elspeth BROCKS, MD;  Location: [facility] OR;  Service: Thoracic;;   KNEE SX     LAPAROSCOPIC PARTIAL COLECTOMY N/A 04/09/2020   Procedure: LAPAROSCOPIC PARTIAL COLECTOMY;  Surgeon: Kallie Manuelita BROCKS, MD;  Location: [facility];  Service: General;  Laterality: N/A;   left eye surgery      LYMPH NODE DISSECTION Left 04/05/2021   Procedure: LYMPH NODE DISSECTION;  Surgeon:  Kerrin Elspeth BROCKS, MD;  Location: [facility] OR;  Service: Thoracic;  Laterality: Left;   POLYPECTOMY  03/02/2020   Procedure: POLYPECTOMY;  Surgeon: Sara Claudis PENNER, MD;  [facility]: [facility];  Service: Endoscopy;;   POLYPECTOMY  01/09/2021   Procedure: POLYPECTOMY;  Surgeon: Sara Claudis PENNER, MD;  [facility]: [facility];  Service: Endoscopy;;   THORACOTOMY  04/05/2021   Procedure: THORACOTOMY MAJOR;  Surgeon: Kerrin Elspeth BROCKS, MD;  Location: [facility] OR;  Service: Thoracic;;   TUBAL LIGATION     VIDEO ASSISTED THORACOSCOPY Left 04/10/2021   Procedure: VIDEO ASSISTED THORACOSCOPY, EVACUATION OF HEMOTHORAX, LEFT LOWER LOBE WEDGE RESECTION;  Surgeon: Kerrin Elspeth BROCKS, MD;  Location: [facility] OR;  Service: Thoracic;  Laterality: Left;   XI ROBOTIC ASSISTED THORACOSCOPY- SEGMENTECTOMY Left 04/05/2021   Procedure: XI ROBOTIC ASSISTED THORACOSCOPY-LEFT LOWER LOBE SUPERIOR SEGMENTECTOMY;  Surgeon: Kerrin Elspeth BROCKS, MD;  Location: [facility] OR;  Service: Thoracic;  Laterality: Left;    Social History: Social History   Socioeconomic History   Marital status: Widowed    Spouse name: Not  on file   Number of children: 3   Years of education: Not on file   Highest education level: Not on file  Occupational History   Occupation: retired  Tobacco Use   Smoking status: Former    Current packs/day: 0.00    Average packs/day: 1 pack/day for 40.0 years (40.0 ttl pk-yrs)    Types: Cigarettes    Start date: 03/28/1981    Quit date: 03/28/2021    Years since quitting: 2.8   Smokeless tobacco: Never  Vaping Use   Vaping status: Some Days  Substance and Sexual Activity   Alcohol use: No   Drug use: No   Sexual activity: Not Currently    Birth control/protection: Surgical, Post-menopausal    Comment: tubal  Other Topics Concern   Not on file  Social History Narrative   Not on file   Social Drivers of Health   Financial Resource Strain: Low Risk  (12/22/2022)   Overall Financial Resource Strain (CARDIA)    Difficulty of Paying Living Expenses: Not very hard  Food Insecurity: No Food Insecurity (12/22/2022)   Hunger Vital Sign    Worried About Running Out of Food in the Last Year: Never true    Ran Out of Food in the Last Year: Never true  Transportation Needs: No Transportation Needs (12/22/2022)   PRAPARE - Administrator, Civil Service (Medical): No    Lack of Transportation (Non-Medical): No  Physical Activity: Insufficiently Active (12/22/2022)   Exercise Vital Sign    Days of Exercise per Week: 5 days    Minutes of Exercise per Session: 20 min  Stress: No Stress Concern Present (12/22/2022)   [employer] of Occupational Health - Occupational Stress Questionnaire    Feeling of Stress : Only a little  Social Connections: Moderately Isolated (12/22/2022)   Social Connection and Isolation Panel    Frequency of Communication with Friends and Family: More than three times a week    Frequency of Social Gatherings with Friends and Family: Once a week    Attends Religious Services: Never    Database administrator or Organizations: No    Attends Tax inspector Meetings: Never    Marital Status: Married  Catering manager Violence: Not At Risk (12/22/2022)   Humiliation, Afraid, Rape, and Kick questionnaire    Fear of Current or Ex-Partner: No    Emotionally Abused: No  Physically Abused: No    Sexually Abused: No    Family History: Family History  Problem Relation Age of Onset   Heart disease Father    Hypertension Father    Diabetes Father    Cancer Brother    Healthy Son    Healthy Son    Healthy Daughter    Ulcers Mother    Kidney failure Maternal Grandfather    Throat cancer Paternal Grandfather    Cancer Brother    Breast cancer Neg Hx     Current Medications:  Current Outpatient Medications:    acetaminophen  (TYLENOL ) 500 MG tablet, Take 2 tablets (1,000 mg total) by mouth every 6 (six) hours as needed for mild pain, fever or headache., Disp: 30 tablet, Rfl: 0   alendronate (FOSAMAX) 70 MG tablet, Take 70 mg by mouth once a week., Disp: , Rfl:    calcium  carbonate (TUMS - DOSED IN MG ELEMENTAL CALCIUM ) 500 MG chewable tablet, Chew 1 tablet by mouth as needed for indigestion or heartburn., Disp: , Rfl:    Cyanocobalamin  (VITAMIN B 12) 500 MCG TABS, 1 tablet, Disp: , Rfl:    escitalopram (LEXAPRO) 10 MG tablet, Take 10 mg by mouth daily., Disp: , Rfl:    Ferrous Sulfate  (IRON) 325 (65 Fe) MG TABS, Take 325 mg by mouth every other day. In the morning, Disp: , Rfl:    Multiple Vitamins-Minerals (MULTIVITAMIN WITH MINERALS) tablet, Take 1 tablet by mouth daily. Multivitamin for Women 50+, Disp: , Rfl:    Omega-3 Fatty Acids (FISH OIL) 1000 MG CAPS, Take 2,000 mg by mouth in the morning., Disp: , Rfl:    Oyster Shell Calcium  500 MG TABS, 1 tablet with meals, Disp: , Rfl:    rosuvastatin  (CRESTOR ) 5 MG tablet, Take 1 tablet (5 mg total) by mouth daily., Disp: 90 tablet, Rfl: 1   Allergies: No Known Allergies  REVIEW OF SYSTEMS:   Review of Systems  Constitutional:  Negative for chills, fatigue and fever.  HENT:    Negative for lump/mass, mouth sores, nosebleeds, sore throat and trouble swallowing.   Eyes:  Negative for eye problems.  Respiratory:  Positive for cough. Negative for shortness of breath.   Cardiovascular:  Negative for chest pain, leg swelling and palpitations.  Gastrointestinal:  Positive for diarrhea. Negative for abdominal pain, constipation, nausea and vomiting.  Genitourinary:  Negative for bladder incontinence, difficulty urinating, dysuria, frequency, hematuria and nocturia.   Musculoskeletal:  Negative for arthralgias, back pain, flank pain, myalgias and neck pain.  Skin:  Negative for itching and rash.  Neurological:  Positive for headaches. Negative for dizziness and numbness.  Hematological:  Does not bruise/bleed easily.  Psychiatric/Behavioral:  Negative for depression, sleep disturbance and suicidal ideas. The patient is not nervous/anxious.   All other systems reviewed and are negative.    VITALS:   Blood pressure 128/69, pulse 73, temperature 97.8 F (36.6 C), temperature source Oral, resp. rate 18, SpO2 96%.  Wt Readings from Last 3 Encounters:  01/22/24 135 lb (61.2 kg)  08/06/23 137 lb (62.1 kg)  12/22/22 136 lb 8 oz (61.9 kg)    There is no height or weight on file to calculate BMI.  Performance status (ECOG): 1 - Symptomatic but completely ambulatory  PHYSICAL EXAM:   Physical Exam Vitals and nursing note reviewed. Exam conducted with a chaperone present.  Constitutional:      Appearance: Normal appearance.   Cardiovascular:     Rate and Rhythm: Normal rate and regular  rhythm.     Pulses: Normal pulses.     Heart sounds: Normal heart sounds.  Pulmonary:     Effort: Pulmonary effort is normal.     Breath sounds: Normal breath sounds.  Abdominal:     Palpations: Abdomen is soft. There is no hepatomegaly, splenomegaly or mass.     Tenderness: There is no abdominal tenderness.   Musculoskeletal:     Right lower leg: No edema.     Left lower leg: No  edema.  Lymphadenopathy:     Cervical: No cervical adenopathy.     Right cervical: No superficial, deep or posterior cervical adenopathy.    Left cervical: No superficial, deep or posterior cervical adenopathy.     Upper Body:     Right upper body: No supraclavicular or axillary adenopathy.     Left upper body: No supraclavicular or axillary adenopathy.   Neurological:     General: No focal deficit present.     Mental Status: She is alert and oriented to person, place, and time.   Psychiatric:        Mood and Affect: Mood normal.        Behavior: Behavior normal.     LABS:      Latest Ref Rng & Units 02/03/2024   12:36 PM 07/31/2023    9:56 AM 01/22/2023   12:45 PM  CBC  WBC 4.0 - 10.5 K/uL 5.2  4.3  5.1   Hemoglobin 12.0 - 15.0 g/dL 86.0  86.1  86.5   Hematocrit 36.0 - 46.0 % 40.9  42.0  39.7   Platelets 150 - 400 K/uL 281  250  272       Latest Ref Rng & Units 02/03/2024   12:36 PM 07/31/2023    9:56 AM 01/22/2023   12:45 PM  CMP  Glucose 70 - 99 mg/dL 91  91  94   BUN 8 - 23 mg/dL 11  11  10    Creatinine 0.44 - 1.00 mg/dL 9.17  9.27  9.17   Sodium 135 - 145 mmol/L 138  139  138   Potassium 3.5 - 5.1 mmol/L 4.8  3.8  3.5   Chloride 98 - 111 mmol/L 103  103  104   CO2 22 - 32 mmol/L 25  27  25    Calcium  8.9 - 10.3 mg/dL 9.8  9.5  9.3   Total Protein 6.5 - 8.1 g/dL 7.3  7.0  7.2   Total Bilirubin 0.0 - 1.2 mg/dL 0.5  0.7  0.7   Alkaline Phos 38 - 126 U/L 76  63  60   AST 15 - 41 U/L 16  15  22    ALT 0 - 44 U/L 14  16  20       Lab Results  Component Value Date   CEA1 2.0 02/03/2024   /  CEA  Date Value Ref Range Status  02/03/2024 2.0 0.0 - 4.7 ng/mL Final    Comment:    (NOTE)                             Nonsmokers          <3.9                             Smokers             <5.6 Roche [employer]ostics Electrochemiluminescence Immunoassay (ECLIA)  Values obtained with different assay methods or kits cannot be used interchangeably.  Results cannot  be interpreted as absolute evidence of the presence or absence of malignant disease. Performed At: [facility] [address] [place], [place] [id number] Jennette Shorter MD Ey:[phone]    No results found for: PSA1 No results found for: CAN199 No results found for: RJW874  Lab Results  Component Value Date   TOTALPROTELP 6.7 05/01/2020   ALBUMINELP 3.8 05/01/2020   A1GS 0.3 05/01/2020   A2GS 0.9 05/01/2020   BETS 1.1 05/01/2020   GAMS 0.7 05/01/2020   MSPIKE Not Observed 05/01/2020   SPEI Comment 05/01/2020   Lab Results  Component Value Date   TIBC 447 11/14/2020   TIBC 415 08/16/2020   TIBC 464 (H) 05/01/2020   FERRITIN 19 11/14/2020   FERRITIN 14 08/16/2020   FERRITIN 24 05/01/2020   IRONPCTSAT 12 11/14/2020   IRONPCTSAT 20 08/16/2020   IRONPCTSAT 4 (L) 05/01/2020   No results found for: LDH   STUDIES:   CT CHEST ABDOMEN PELVIS W CONTRAST Result Date: 02/04/2024 CLINICAL DATA:  History of non-small cell lung cancer and colon cancer, monitor * Tracking Code: BO * EXAM: CT CHEST, ABDOMEN, AND PELVIS WITH CONTRAST TECHNIQUE: Multidetector CT imaging of the chest, abdomen and pelvis was performed following the standard protocol during bolus administration of intravenous contrast. RADIATION DOSE REDUCTION: This exam was performed according to the departmental dose-optimization program which includes automated exposure control, adjustment of the mA and/or kV according to patient size and/or use of iterative reconstruction technique. CONTRAST:  80mL OMNIPAQUE  IOHEXOL  350 MG/ML SOLN additional oral enteric contrast COMPARISON:  CT chest, 07/31/2023, CT chest abdomen pelvis, 01/22/2023 FINDINGS: CT CHEST FINDINGS Cardiovascular: Aortic atherosclerosis. Normal heart size. Scattered left coronary artery calcifications. No pericardial effusion. Mediastinum/Nodes: No enlarged mediastinal, hilar, or axillary lymph nodes. Thyroid gland, trachea, and esophagus demonstrate  no significant findings. Lungs/Pleura: Status post subtotal left lower lobectomy with a small chronic loculated left pleural effusion. Unchanged 0.4 cm nodule of the peripheral right lower lobe (series 3, image 71). Musculoskeletal: No chest wall abnormality. No acute osseous findings. CT ABDOMEN PELVIS FINDINGS Hepatobiliary: No solid liver abnormality is seen. No gallstones, gallbladder wall thickening, or biliary dilatation. Pancreas: Unremarkable. No pancreatic ductal dilatation or surrounding inflammatory changes. Spleen: Normal in size without significant abnormality. Adrenals/Urinary Tract: Adrenal glands are unremarkable. Kidneys are normal, without renal calculi, solid lesion, or hydronephrosis. Bladder is unremarkable. Stomach/Bowel: Stomach is within normal limits. Status post partial right hemicolectomy and reanastomosis. No evidence of bowel wall thickening, distention, or inflammatory changes. Sigmoid diverticulosis. Vascular/Lymphatic: Aortic atherosclerosis. No enlarged abdominal or pelvic lymph nodes. Reproductive: No mass or other abnormality. Other: No abdominal wall hernia or abnormality. No ascites. Musculoskeletal: No acute osseous findings. IMPRESSION: 1. Status post subtotal left lower lobectomy with a small chronic loculated left pleural effusion. 2. Unchanged 0.4 cm nodule of the peripheral right lower lobe, nonspecific but almost certainly benign and incidental sequelae of prior infection or inflammation. Attention on follow-up. 3. Status post partial right hemicolectomy and reanastomosis. 4. No evidence of lymphadenopathy or metastatic disease in the abdomen or pelvis. 5. Coronary artery disease. Aortic Atherosclerosis (ICD10-I70.0). Electronically Signed   By: Marolyn JONETTA Jaksch M.D.   On: 02/04/2024 06:53   MM 3D SCREENING MAMMOGRAM BILATERAL BREAST Result Date: 01/19/2024 CLINICAL DATA:  Screening. EXAM: DIGITAL SCREENING BILATERAL MAMMOGRAM WITH TOMOSYNTHESIS AND CAD TECHNIQUE: Bilateral  screening digital craniocaudal and mediolateral oblique mammograms were obtained. Bilateral screening digital breast  tomosynthesis was performed. The images were evaluated with computer-aided detection. COMPARISON:  Previous exam(s). ACR Breast Density Category c: The breasts are heterogeneously dense, which may obscure small masses. FINDINGS: There are no findings suspicious for malignancy. IMPRESSION: No mammographic evidence of malignancy. A result letter of this screening mammogram will be mailed directly to the patient. RECOMMENDATION: Screening mammogram in one year. (Code:[id number]-B-01Y) BI-RADS CATEGORY  1: Negative. Electronically Signed   By: Dirk Arrant M.D.   On: 01/19/2024 11:30   DG BONE DENSITY (DXA) Result Date: 01/18/2024 EXAM: DUAL X-RAY ABSORPTIOMETRY (DXA) FOR BONE MINERAL DENSITY 01/14/2024 1:19 pm CLINICAL DATA:  73 year old Female Postmenopausal. Screening for osteoporosis Patient is or has been on bone building therapies. TECHNIQUE: An axial (e.g., hips, spine) and/or appendicular (e.g., radius) exam was performed, as appropriate, using GE Psychologist, sport and exercise at [facility]. Images are obtained for bone mineral density measurement and are not obtained for diagnostic purposes. [id number] Exclusions: Lumbar spine due to advanced degenerative changes. COMPARISON:  02/03/2022. FINDINGS: Scan quality: Good. LEFT FEMORAL NECK: BMD (in g/cm2): 0.879 T-score: -1.1 Z-score: 0.6 LEFT TOTAL HIP: BMD (in g/cm2): 0.722 T-score: -2.3 Z-score: -0.8 RIGHT FEMORAL NECK: BMD (in g/cm2): 0.848 T-score: -1.4 Z-score: 0.3 RIGHT TOTAL HIP: BMD (in g/cm2): 0.733 T-score: -2.2 Z-score: -0.7 DUAL-FEMUR TOTAL MEAN: Rate of change from previous exam: No significant rate of change from previous exam. LEFT FOREARM (RADIUS 33%): BMD (in g/cm2): 0.444 T-score: -3.7 Z-score: -1.8 Rate of change from previous exam: No significant rate of change from previous exam. FRAX 10-YEAR PROBABILITY OF FRACTURE: FRAX not  reported as the lowest BMD is not in the osteopenia range. IMPRESSION: Osteoporosis based on BMD. Fracture risk is unknown due to history of bone building therapy. RECOMMENDATIONS: 1. All patients should optimize calcium  and vitamin D  intake. 2. Consider FDA-approved medical therapies in postmenopausal women and men aged 84 years and older, based on the following: - A hip or vertebral (clinical or morphometric) fracture - T-score less than or equal to -2.5 and secondary causes have been excluded. - Low bone mass (T-score between -1.0 and -2.5) and a 10-year probability of a hip fracture greater than or equal to 3% or a 10-year probability of a major osteoporosis-related fracture greater than or equal to 20% based on the [place] -adapted WHO algorithm. - Clinician judgment and/or patient preferences may indicate treatment for people with 10-year fracture probabilities above or below these levels 3. Patients with diagnosis of osteoporosis or at high risk for fracture should have regular bone mineral density tests. For patients eligible for Medicare, routine testing is allowed once every 2 years. The testing frequency can be increased to one year for patients who have rapidly progressing disease, those who are receiving or discontinuing medical therapy to restore bone mass, or have additional risk factors. Electronically Signed   By: Alm Parkins M.D.   On: 01/18/2024 11:07

## 2024-03-14 DIAGNOSIS — E782 Mixed hyperlipidemia: Secondary | ICD-10-CM | POA: Diagnosis not present

## 2024-03-14 DIAGNOSIS — M818 Other osteoporosis without current pathological fracture: Secondary | ICD-10-CM | POA: Diagnosis not present

## 2024-04-13 DIAGNOSIS — H43393 Other vitreous opacities, bilateral: Secondary | ICD-10-CM | POA: Diagnosis not present

## 2024-05-17 DIAGNOSIS — L814 Other melanin hyperpigmentation: Secondary | ICD-10-CM | POA: Diagnosis not present

## 2024-05-17 DIAGNOSIS — D1801 Hemangioma of skin and subcutaneous tissue: Secondary | ICD-10-CM | POA: Diagnosis not present

## 2024-05-17 DIAGNOSIS — Z08 Encounter for follow-up examination after completed treatment for malignant neoplasm: Secondary | ICD-10-CM | POA: Diagnosis not present

## 2024-05-17 DIAGNOSIS — L821 Other seborrheic keratosis: Secondary | ICD-10-CM | POA: Diagnosis not present

## 2024-05-17 DIAGNOSIS — Z85828 Personal history of other malignant neoplasm of skin: Secondary | ICD-10-CM | POA: Diagnosis not present

## 2024-06-14 DIAGNOSIS — H2511 Age-related nuclear cataract, right eye: Secondary | ICD-10-CM | POA: Diagnosis not present

## 2024-06-14 DIAGNOSIS — H18413 Arcus senilis, bilateral: Secondary | ICD-10-CM | POA: Diagnosis not present

## 2024-06-14 DIAGNOSIS — H25043 Posterior subcapsular polar age-related cataract, bilateral: Secondary | ICD-10-CM | POA: Diagnosis not present

## 2024-06-14 DIAGNOSIS — H25013 Cortical age-related cataract, bilateral: Secondary | ICD-10-CM | POA: Diagnosis not present

## 2024-06-14 DIAGNOSIS — H2513 Age-related nuclear cataract, bilateral: Secondary | ICD-10-CM | POA: Diagnosis not present

## 2025-02-01 ENCOUNTER — Other Ambulatory Visit

## 2025-02-01 ENCOUNTER — Other Ambulatory Visit (HOSPITAL_COMMUNITY)

## 2025-02-08 ENCOUNTER — Ambulatory Visit: Admitting: Oncology
